# Patient Record
Sex: Female | Born: 1985 | Race: Black or African American | Hispanic: No | Marital: Single | Smoking: Current every day smoker
Health system: Southern US, Community
[De-identification: ages and names within clinical notes are randomized; demographics above are authoritative.]

## PROBLEM LIST (undated history)

## (undated) DIAGNOSIS — K219 Gastro-esophageal reflux disease without esophagitis: Secondary | ICD-10-CM

## (undated) DIAGNOSIS — IMO0001 Reserved for inherently not codable concepts without codable children: Secondary | ICD-10-CM

## (undated) DIAGNOSIS — L0291 Cutaneous abscess, unspecified: Secondary | ICD-10-CM

## (undated) DIAGNOSIS — T4145XA Adverse effect of unspecified anesthetic, initial encounter: Secondary | ICD-10-CM

## (undated) DIAGNOSIS — T8859XA Other complications of anesthesia, initial encounter: Secondary | ICD-10-CM

## (undated) HISTORY — PX: CHOLECYSTECTOMY: SHX55

## (undated) HISTORY — PX: DILATION AND CURETTAGE OF UTERUS: SHX78

## (undated) HISTORY — PX: ABCESS DRAINAGE: SHX399

## (undated) HISTORY — PX: HERNIA REPAIR: SHX51

## (undated) HISTORY — PX: ABDOMINAL SURGERY: SHX537

## (undated) HISTORY — PX: TUBAL LIGATION: SHX77

## (undated) HISTORY — PX: HYDRADENITIS EXCISION: SHX5243

---

## 2005-07-15 ENCOUNTER — Emergency Department (HOSPITAL_COMMUNITY): Admission: EM | Admit: 2005-07-15 | Discharge: 2005-07-15 | Payer: Self-pay | Admitting: Family Medicine

## 2005-07-17 ENCOUNTER — Emergency Department (HOSPITAL_COMMUNITY): Admission: EM | Admit: 2005-07-17 | Discharge: 2005-07-17 | Payer: Self-pay | Admitting: Family Medicine

## 2005-09-20 ENCOUNTER — Emergency Department (HOSPITAL_COMMUNITY): Admission: EM | Admit: 2005-09-20 | Discharge: 2005-09-20 | Payer: Self-pay | Admitting: Emergency Medicine

## 2005-09-20 ENCOUNTER — Ambulatory Visit (HOSPITAL_COMMUNITY): Admission: RE | Admit: 2005-09-20 | Discharge: 2005-09-20 | Payer: Self-pay | Admitting: Emergency Medicine

## 2006-07-03 ENCOUNTER — Emergency Department (HOSPITAL_COMMUNITY): Admission: EM | Admit: 2006-07-03 | Discharge: 2006-07-04 | Payer: Self-pay | Admitting: Emergency Medicine

## 2006-11-26 ENCOUNTER — Emergency Department (HOSPITAL_COMMUNITY): Admission: EM | Admit: 2006-11-26 | Discharge: 2006-11-26 | Payer: Self-pay | Admitting: Emergency Medicine

## 2007-01-31 ENCOUNTER — Ambulatory Visit (HOSPITAL_COMMUNITY): Admission: RE | Admit: 2007-01-31 | Discharge: 2007-01-31 | Payer: Self-pay | Admitting: Obstetrics and Gynecology

## 2010-11-21 ENCOUNTER — Other Ambulatory Visit: Payer: Self-pay | Admitting: Family Medicine

## 2010-11-21 DIAGNOSIS — Z3689 Encounter for other specified antenatal screening: Secondary | ICD-10-CM

## 2010-12-19 ENCOUNTER — Ambulatory Visit (HOSPITAL_COMMUNITY)
Admission: RE | Admit: 2010-12-19 | Discharge: 2010-12-19 | Disposition: A | Discharge status: Home / Self Care | Payer: Medicaid Other | Source: Ambulatory Visit | Attending: Family Medicine | Admitting: Family Medicine

## 2010-12-19 ENCOUNTER — Encounter (HOSPITAL_COMMUNITY): Payer: Self-pay

## 2010-12-19 DIAGNOSIS — Z3689 Encounter for other specified antenatal screening: Secondary | ICD-10-CM

## 2010-12-19 DIAGNOSIS — Z363 Encounter for antenatal screening for malformations: Secondary | ICD-10-CM | POA: Insufficient documentation

## 2010-12-19 DIAGNOSIS — O358XX0 Maternal care for other (suspected) fetal abnormality and damage, not applicable or unspecified: Secondary | ICD-10-CM | POA: Insufficient documentation

## 2010-12-19 DIAGNOSIS — Z1389 Encounter for screening for other disorder: Secondary | ICD-10-CM | POA: Insufficient documentation

## 2011-01-02 ENCOUNTER — Other Ambulatory Visit (HOSPITAL_COMMUNITY): Payer: Self-pay | Admitting: Obstetrics and Gynecology

## 2011-01-02 DIAGNOSIS — Z09 Encounter for follow-up examination after completed treatment for conditions other than malignant neoplasm: Secondary | ICD-10-CM

## 2011-01-09 ENCOUNTER — Ambulatory Visit (HOSPITAL_COMMUNITY): Payer: Medicaid Other

## 2011-01-18 ENCOUNTER — Ambulatory Visit (HOSPITAL_COMMUNITY): Payer: Medicaid Other

## 2011-02-01 ENCOUNTER — Ambulatory Visit (HOSPITAL_COMMUNITY)
Admission: RE | Admit: 2011-02-01 | Discharge: 2011-02-01 | Disposition: A | Discharge status: Home / Self Care | Payer: Medicaid Other | Source: Ambulatory Visit | Attending: Obstetrics and Gynecology | Admitting: Obstetrics and Gynecology

## 2011-02-01 DIAGNOSIS — Z1389 Encounter for screening for other disorder: Secondary | ICD-10-CM | POA: Insufficient documentation

## 2011-02-01 DIAGNOSIS — Z09 Encounter for follow-up examination after completed treatment for conditions other than malignant neoplasm: Secondary | ICD-10-CM

## 2011-02-01 DIAGNOSIS — O358XX0 Maternal care for other (suspected) fetal abnormality and damage, not applicable or unspecified: Secondary | ICD-10-CM | POA: Insufficient documentation

## 2011-02-01 DIAGNOSIS — Z363 Encounter for antenatal screening for malformations: Secondary | ICD-10-CM | POA: Insufficient documentation

## 2011-02-26 ENCOUNTER — Other Ambulatory Visit (HOSPITAL_COMMUNITY): Payer: Self-pay | Admitting: *Deleted

## 2011-05-14 ENCOUNTER — Inpatient Hospital Stay (HOSPITAL_COMMUNITY): Admission: AD | Admit: 2011-05-14 | Payer: Self-pay | Source: Ambulatory Visit | Admitting: Obstetrics & Gynecology

## 2013-10-15 HISTORY — PX: COLPOSCOPY: SHX161

## 2014-08-16 ENCOUNTER — Encounter (HOSPITAL_COMMUNITY): Payer: Self-pay

## 2014-09-28 DIAGNOSIS — K8010 Calculus of gallbladder with chronic cholecystitis without obstruction: Secondary | ICD-10-CM | POA: Insufficient documentation

## 2014-10-15 HISTORY — PX: TUBAL LIGATION: SHX77

## 2014-10-15 HISTORY — PX: GALLBLADDER SURGERY: SHX652

## 2014-10-15 HISTORY — PX: INGUINAL HERNIA REPAIR: SUR1180

## 2014-11-25 DIAGNOSIS — Z9049 Acquired absence of other specified parts of digestive tract: Secondary | ICD-10-CM | POA: Insufficient documentation

## 2015-02-08 ENCOUNTER — Emergency Department (HOSPITAL_COMMUNITY): Payer: Medicaid Other

## 2015-02-08 ENCOUNTER — Emergency Department (HOSPITAL_COMMUNITY)
Admission: EM | Admit: 2015-02-08 | Discharge: 2015-02-08 | Disposition: A | Discharge status: Home / Self Care | Payer: Medicaid Other | Attending: Emergency Medicine | Admitting: Emergency Medicine

## 2015-02-08 ENCOUNTER — Encounter (HOSPITAL_COMMUNITY): Payer: Self-pay | Admitting: *Deleted

## 2015-02-08 DIAGNOSIS — R0781 Pleurodynia: Secondary | ICD-10-CM | POA: Diagnosis not present

## 2015-02-08 DIAGNOSIS — N739 Female pelvic inflammatory disease, unspecified: Secondary | ICD-10-CM

## 2015-02-08 DIAGNOSIS — K610 Anal abscess: Secondary | ICD-10-CM | POA: Insufficient documentation

## 2015-02-08 DIAGNOSIS — R109 Unspecified abdominal pain: Secondary | ICD-10-CM | POA: Diagnosis present

## 2015-02-08 DIAGNOSIS — L0231 Cutaneous abscess of buttock: Secondary | ICD-10-CM | POA: Insufficient documentation

## 2015-02-08 DIAGNOSIS — R52 Pain, unspecified: Secondary | ICD-10-CM

## 2015-02-08 DIAGNOSIS — Z720 Tobacco use: Secondary | ICD-10-CM | POA: Diagnosis not present

## 2015-02-08 DIAGNOSIS — N764 Abscess of vulva: Secondary | ICD-10-CM | POA: Insufficient documentation

## 2015-02-08 DIAGNOSIS — Z3202 Encounter for pregnancy test, result negative: Secondary | ICD-10-CM | POA: Insufficient documentation

## 2015-02-08 LAB — COMPREHENSIVE METABOLIC PANEL
ALK PHOS: 79 U/L (ref 39–117)
ALT: 21 U/L (ref 0–35)
AST: 20 U/L (ref 0–37)
Albumin: 3.5 g/dL (ref 3.5–5.2)
Anion gap: 7 (ref 5–15)
BUN: 8 mg/dL (ref 6–23)
CALCIUM: 8.7 mg/dL (ref 8.4–10.5)
CO2: 24 mmol/L (ref 19–32)
CREATININE: 0.74 mg/dL (ref 0.50–1.10)
Chloride: 106 mmol/L (ref 96–112)
GFR calc Af Amer: >90 mL/min (ref >90)
GFR calc non Af Amer: >90 mL/min (ref >90)
Glucose, Bld: 90 mg/dL (ref 70–99)
Potassium: 3.7 mmol/L (ref 3.5–5.1)
Sodium: 137 mmol/L (ref 135–145)
Total Bilirubin: 0.3 mg/dL (ref 0.3–1.2)
Total Protein: 6.8 g/dL (ref 6.0–8.3)

## 2015-02-08 LAB — CBC WITH DIFFERENTIAL/PLATELET
BASOS ABS: 0 10*3/uL (ref 0.0–0.1)
Basophils Relative: 0 % (ref 0–1)
Eosinophils Absolute: 0.3 10*3/uL (ref 0.0–0.7)
Eosinophils Relative: 4 % (ref 0–5)
HEMATOCRIT: 35.6 % — AB (ref 36.0–46.0)
Hemoglobin: 11.1 g/dL — ABNORMAL LOW (ref 12.0–15.0)
Lymphocytes Relative: 22 % (ref 12–46)
Lymphs Abs: 1.8 10*3/uL (ref 0.7–4.0)
MCH: 24.9 pg — ABNORMAL LOW (ref 26.0–34.0)
MCHC: 31.2 g/dL (ref 30.0–36.0)
MCV: 79.8 fL (ref 78.0–100.0)
Monocytes Absolute: 0.5 10*3/uL (ref 0.1–1.0)
Monocytes Relative: 6 % (ref 3–12)
Neutro Abs: 5.5 10*3/uL (ref 1.7–7.7)
Neutrophils Relative %: 68 % (ref 43–77)
Platelets: 230 10*3/uL (ref 150–400)
RBC: 4.46 MIL/uL (ref 3.87–5.11)
RDW: 16.5 % — AB (ref 11.5–15.5)
WBC: 8 10*3/uL (ref 4.0–10.5)

## 2015-02-08 LAB — URINALYSIS, ROUTINE W REFLEX MICROSCOPIC
BILIRUBIN URINE: NEGATIVE
Glucose, UA: NEGATIVE mg/dL
HGB URINE DIPSTICK: NEGATIVE
Ketones, ur: NEGATIVE mg/dL
Leukocytes, UA: NEGATIVE
NITRITE: NEGATIVE
PROTEIN: NEGATIVE mg/dL
SPECIFIC GRAVITY, URINE: 1.011 (ref 1.005–1.030)
Urobilinogen, UA: 0.2 mg/dL (ref 0.0–1.0)
pH: 6.5 (ref 5.0–8.0)

## 2015-02-08 LAB — POC URINE PREG, ED: Preg Test, Ur: NEGATIVE

## 2015-02-08 LAB — LIPASE, BLOOD: LIPASE: 22 U/L (ref 11–59)

## 2015-02-08 MED ORDER — CLINDAMYCIN HCL 300 MG PO CAPS: 300.0000 mg | ORAL_CAPSULE | Freq: Four times a day (QID) | ORAL | Status: DC

## 2015-02-08 MED ORDER — OXYCODONE-ACETAMINOPHEN 5-325 MG PO TABS
1.0000 | ORAL_TABLET | Freq: Once | ORAL | Status: AC
  Administered 2015-02-08: 1 via ORAL
  Filled 2015-02-08: qty 1

## 2015-02-08 MED ORDER — LIDOCAINE HCL (PF) 2 % IJ SOLN
0.0000 mL | Freq: Once | INTRAMUSCULAR | Status: DC | PRN
  Filled 2015-02-08: qty 20

## 2015-02-08 NOTE — ED Provider Notes (Signed)
CSN: [id number]     Arrival date & time 02/08/15  4098 History   First MD Initiated Contact with Patient 02/08/15 1019     Chief Complaint  Patient presents with  . Abdominal Pain     (Consider location/radiation/quality/duration/timing/severity/associated sxs/prior Treatment) HPI  Samantha Orr is a 29 y.o. female presenting with abscess to left pelvic area and right buttocks that have been ongoing for the last week. Patient denies any treatment. Pain worse with palpation. No discharge. No fevers or chills no nausea vomiting. Patient also reporting persistent right-sided pain after laparoscopic cholecystectomy in February of this year. She presented multiple times May diagnosed her with postop pain. She denies any nausea, vomiting, diarrhea. No abdominal pain no pelvic or urinary symptoms. Patient has not been taking anything for her discomfort.   History reviewed. No pertinent past medical history. Past Surgical History  Procedure Laterality Date  . Abdominal surgery    . Cholecystectomy    . Tubal ligation     No family history on file. History  Substance Use Topics  . Smoking status: Current Every Day Smoker -- 0.50 packs/day  . Smokeless tobacco: Not on file  . Alcohol Use: Yes   OB History    Gravida Para Term Preterm AB TAB SAB Ectopic Multiple Living   1              Review of Systems 10 Systems reviewed and are negative for acute change except as noted in the HPI.    Allergies  Review of patient's allergies indicates no known allergies.  Home Medications   Prior to Admission medications   Medication Sig Start Date End Date Taking? Authorizing Provider  clindamycin (CLEOCIN) 300 MG capsule Take 1 capsule (300 mg total) by mouth 4 (four) times daily. X 7 days 02/08/15   Oswaldo Conroy, PA-C   BP 100/81 mmHg  Pulse 73  Temp(Src) 98.4 F (36.9 C) (Oral)  Resp 18  Ht  (1.575 m)  Wt 203 lb (92.08 kg)  BMI 37.12 kg/m2  SpO2 100%  LMP 01/06/2015  (Approximate) Physical Exam  Constitutional: She appears well-developed and well-nourished. No distress.  HENT:  Head: Normocephalic and atraumatic.  Mouth/Throat: Oropharynx is clear and moist.  Eyes: Conjunctivae and EOM are normal. Right eye exhibits no discharge. Left eye exhibits no discharge.  Cardiovascular: Normal rate and regular rhythm.   Pulmonary/Chest: Effort normal and breath sounds normal. No respiratory distress. She has no wheezes.  Right-sided low lateral rib tenderness without overlying skin changes. No subcutaneous emphysema.  Abdominal: Soft. Bowel sounds are normal. She exhibits no distension. There is no tenderness.  Neurological: She is alert. She exhibits normal muscle tone. Coordination normal.  Skin: Skin is warm and dry. She is not diaphoretic.  2-3 cm area of fluctuance with surrounding induration to right buttocks 4-5 cm from midline. No spontaneous drainage. 1-2 cm area of fluctuance to left upper pubic area without spontaneous drainage.  Nursing note and vitals reviewed.   ED Course  Procedures (including critical care time) Labs Review Labs Reviewed  CBC WITH DIFFERENTIAL/PLATELET - Abnormal; Notable for the following:    Hemoglobin 11.1 (*)    HCT 35.6 (*)    MCH 24.9 (*)    RDW 16.5 (*)    All other components within normal limits  COMPREHENSIVE METABOLIC PANEL  LIPASE, BLOOD  URINALYSIS, ROUTINE W REFLEX MICROSCOPIC  POC URINE PREG, ED    Imaging Review Dg Abd Acute W/chest  02/08/2015  CLINICAL DATA:  29 year old female with right-sided abdominal pain for 7 weeks post gallbladder surgery 11/17/2014. Initial encounter.  EXAM: DG ABDOMEN ACUTE W/ 1V CHEST  COMPARISON:  None.  FINDINGS: No infiltrate, congestive heart failure or pneumothorax.  Mild central pulmonary vascular prominence.  Heart size within normal limits.  No plain film evidence of bowel obstruction or free intraperitoneal air. Surgical clips right upper quadrant consistent with  prior cholecystectomy.  IMPRESSION: No plain film evidence of bowel obstruction or free intraperitoneal air. If postoperative complication were of high clinical concern CT imaging may be considered.   Electronically Signed   By: Lacy DuverneySteven  Olson M.D.   On: 02/08/2015 12:59     EKG Interpretation None      INCISION AND DRAINAGE Performed by: Oswaldo ConroyREECH, Anber Mckiver Consent: Verbal consent obtained. Risks and benefits: risks, benefits and alternatives were discussed Type: abscess  Body area: Right buttock and left upper pelvic area.  Anesthesia: local infiltration  Incision was made with a scalpel.  Local anesthetic: lidocaine 2% w/o epinephrine  Anesthetic total: 6 ml  Complexity: complex Blunt dissection to break up loculations  Drainage: purulent  Drainage amount: Copious   Packing material: 1/4 in iodoform gauze in both areas   Patient tolerance: Patient tolerated the procedure well with no immediate complications.     MDM   Final diagnoses:  Perianal abscess  Abscess of female genitalia  Pain of right side of body   Patient presenting with 2 complaints, right-sided persistent pain after surgery without change and abscess. No other abdominal symptoms. VSS. No abdominal tenderness on exam. Nonsurgical abdomen. Minimal tenderness to right lateral abdomen without skin changes. On repeat exam patient without any tenderness. Lab workup reassuring. She is to follow-up with her surgeon. IND performed with abscess without immediate complications. Packing in place. Patient given clindamycin and is to follow-up with the wellness center in 2 days for wound reevaluation. Discussed warm soaks.  Discussed return precautions with patient. Discussed all results and patient verbalizes understanding and agrees with plan.    Oswaldo ConroyVictoria Amany Rando, PA-C 02/08/15 1442  Samuel JesterKathleen McManus, DO 02/11/15 2111

## 2015-02-08 NOTE — Discharge Instructions (Signed)
Return to the emergency room with worsening of symptoms, new symptoms or with symptoms that are concerning , especially fevers, abdominal pain in one area, unable to keep down fluids, blood in stool or vomit, severe pain, you feel faint, lightheaded or pass out OR fevers, redness, swelling, red streaks from the area. Warm soaks to the area 3 times daily. Please take all of your antibiotics until finished!   You may develop abdominal discomfort or diarrhea from the antibiotic.  You may help offset this with probiotics which you can buy or get in yogurt. Do not eat  or take the probiotics until 2 hours after your antibiotic.  Go to the wellness center in 2 days for wound recheck. Read below information and follow recommendations.  Abscess Care After An abscess (also called a boil or furuncle) is an infected area that contains a collection of pus. Signs and symptoms of an abscess include pain, tenderness, redness, or hardness, or you may feel a moveable soft area under your skin. An abscess can occur anywhere in the body. The infection may spread to surrounding tissues causing cellulitis. A cut (incision) by the surgeon was made over your abscess and the pus was drained out. Gauze may have been packed into the space to provide a drain that will allow the cavity to heal from the inside outwards. The boil may be painful for 5 to 7 days. Most people with a boil do not have high fevers. Your abscess, if seen early, may not have localized, and may not have been lanced. If not, another appointment may be required for this if it does not get better on its own or with medications. HOME CARE INSTRUCTIONS   Only take over-the-counter or prescription medicines for pain, discomfort, or fever as directed by your caregiver.  When you bathe, soak and then remove gauze or iodoform packs at least daily or as directed by your caregiver. You may then wash the wound gently with mild soapy water. Repack with gauze or do as  your caregiver directs. SEEK IMMEDIATE MEDICAL CARE IF:   You develop increased pain, swelling, redness, drainage, or bleeding in the wound site.  You develop signs of generalized infection including muscle aches, chills, fever, or a general ill feeling.  An oral temperature above 102 F (38.9 C) develops, not controlled by medication. See your caregiver for a recheck if you develop any of the symptoms described above. If medications (antibiotics) were prescribed, take them as directed. Document Released: 04/19/2005 Document Revised: 12/24/2011 Document Reviewed: 12/15/2007 [facility] Patient Information 2015 [place], [place]. This information is not intended to replace advice given to you by your health care provider. Make sure you discuss any questions you have with your health care provider.  Abdominal Pain Many things can cause abdominal pain. Usually, abdominal pain is not caused by a disease and will improve without treatment. It can often be observed and treated at home. Your health care provider will do a physical exam and possibly order blood tests and X-rays to help determine the seriousness of your pain. However, in many cases, more time must pass before a clear cause of the pain can be found. Before that point, your health care provider may not know if you need more testing or further treatment. HOME CARE INSTRUCTIONS  Monitor your abdominal pain for any changes. The following actions may help to alleviate any discomfort you are experiencing:  Only take over-the-counter or prescription medicines as directed by your health care provider.  Do  not take laxatives unless directed to do so by your health care provider.  Try a clear liquid diet (broth, tea, or water) as directed by your health care provider. Slowly move to a bland diet as tolerated. SEEK MEDICAL CARE IF:  You have unexplained abdominal pain.  You have abdominal pain associated with nausea or diarrhea.  You have pain  when you urinate or have a bowel movement.  You experience abdominal pain that wakes you in the night.  You have abdominal pain that is worsened or improved by eating food.  You have abdominal pain that is worsened with eating fatty foods.  You have a fever. SEEK IMMEDIATE MEDICAL CARE IF:   Your pain does not go away within 2 hours.  You keep throwing up (vomiting).  Your pain is felt only in portions of the abdomen, such as the right side or the left lower portion of the abdomen.  You pass bloody or black tarry stools. MAKE SURE YOU:  Understand these instructions.   Will watch your condition.   Will get help right away if you are not doing well or get worse.  Document Released: 07/11/2005 Document Revised: 10/06/2013 Document Reviewed: 06/10/2013 [facility] Patient Information 2015 [place], [place]. This information is not intended to replace advice given to you by your health care provider. Make sure you discuss any questions you have with your health care provider.

## 2015-02-08 NOTE — ED Notes (Signed)
Pt reports rt side pain that had been intermittent since abdominal surgeries in february. Pt also reports 2 abscesses one on rt buttocks and other on left side.

## 2015-02-11 ENCOUNTER — Inpatient Hospital Stay: Payer: Medicaid Other | Admitting: Family Medicine

## 2015-02-20 ENCOUNTER — Emergency Department (HOSPITAL_COMMUNITY)
Admission: EM | Admit: 2015-02-20 | Discharge: 2015-02-20 | Disposition: A | Discharge status: Home / Self Care | Payer: Medicaid Other | Attending: Emergency Medicine | Admitting: Emergency Medicine

## 2015-02-20 ENCOUNTER — Encounter (HOSPITAL_COMMUNITY): Payer: Self-pay

## 2015-02-20 DIAGNOSIS — Z720 Tobacco use: Secondary | ICD-10-CM | POA: Diagnosis not present

## 2015-02-20 DIAGNOSIS — L02214 Cutaneous abscess of groin: Secondary | ICD-10-CM | POA: Diagnosis not present

## 2015-02-20 HISTORY — DX: Cutaneous abscess, unspecified: L02.91

## 2015-02-20 MED ORDER — HYDROCODONE-ACETAMINOPHEN 5-325 MG PO TABS
1.0000 | ORAL_TABLET | Freq: Once | ORAL | Status: AC
  Administered 2015-02-20: 1 via ORAL
  Filled 2015-02-20: qty 1

## 2015-02-20 MED ORDER — CLINDAMYCIN HCL 150 MG PO CAPS: 300.0000 mg | ORAL_CAPSULE | Freq: Four times a day (QID) | ORAL | Status: DC

## 2015-02-20 MED ORDER — HYDROCODONE-ACETAMINOPHEN 5-325 MG PO TABS: ORAL_TABLET | ORAL | Status: DC

## 2015-02-20 MED ORDER — NAPROXEN 500 MG PO TABS: 500.0000 mg | ORAL_TABLET | Freq: Two times a day (BID) | ORAL | Status: DC

## 2015-02-20 MED ORDER — LIDOCAINE-EPINEPHRINE 2 %-1:100000 IJ SOLN
10.0000 mL | Freq: Once | INTRAMUSCULAR | Status: AC
  Administered 2015-02-20: 1 mL
  Filled 2015-02-20: qty 1

## 2015-02-20 NOTE — ED Notes (Signed)
Pt here 1 week ago for presenting complaint[facility] One abscess got better the other did not improve. Denies N/V/D and fever

## 2015-02-20 NOTE — ED Notes (Signed)
ED PA at bedside

## 2015-02-20 NOTE — ED Provider Notes (Signed)
CSN: [id number]     Arrival date & time 02/20/15  1131 History   First MD Initiated Contact with Patient 02/20/15 1136     CC: abscess   (Consider location/radiation/quality/duration/timing/severity/associated sxs/prior Treatment) HPI Comments: Patient with history of recurrent skin abscesses presents with continued left groin abscess. Patient was seen in emergency department 12 days ago and had this abscess drained, as well as a buttocks abscess. Patient was placed on clindamycin. Patient states that the buttocks abscess improved and is resolving. Her left groin abscess continues. Patient has not been compliant with her oral antibiotics because "I do not like taking pills". Patient denies fever, nausea, vomiting. No urinary symptoms. No pain with abdominal. No history of immunocompromise. Patient states that she has multiple abscesses after having a C-section recently. Patient was using warm compresses. No other treatments prior to arrival.  The history is provided by the patient and medical records.    Past Medical History  Diagnosis Date  . Abscess    Past Surgical History  Procedure Laterality Date  . Abdominal surgery    . Cholecystectomy    . Tubal ligation    . Hernia repair    . Tubal ligation    . Cesarean section    . Abcess drainage      BUTTOCK   No family history on file. History  Substance Use Topics  . Smoking status: Current Every Day Smoker -- 0.50 packs/day  . Smokeless tobacco: Not on file  . Alcohol Use: Yes   OB History    Gravida Para Term Preterm AB TAB SAB Ectopic Multiple Living   1              Review of Systems  Constitutional: Negative for fever.  Gastrointestinal: Negative for nausea and vomiting.  Skin: Negative for color change.       Positive for abscess.  Hematological: Negative for adenopathy.      Allergies  Bactrim  Home Medications   Prior to Admission medications   Medication Sig Start Date End Date Taking? Authorizing  Provider  clindamycin (CLEOCIN) 300 MG capsule Take 1 capsule (300 mg total) by mouth 4 (four) times daily. X 7 days Patient not taking: Reported on 02/20/2015 02/08/15   Oswaldo ConroyVictoria Creech, PA-C   BP 128/70 mmHg  Pulse 116  Temp(Src) 99.3 F (37.4 C) (Oral)  Resp 17  Ht 5\' 2"  (1.575 m)  Wt 203 lb (92.08 kg)  BMI 37.12 kg/m2  SpO2 98%  LMP 01/06/2015 (Approximate)  Breastfeeding? Unknown   Physical Exam  Constitutional: She appears well-developed and well-nourished.  HENT:  Head: Normocephalic and atraumatic.  Eyes: Conjunctivae are normal.  Neck: Normal range of motion. Neck supple.  Pulmonary/Chest: No respiratory distress.  Neurological: She is alert.  Skin: Skin is warm and dry.  3 cm indurated abscess noted left inguinal crease, no drainage. There is several centimeters of surrounding cellulitis. This area is generally scarred from previous incisions and infections.  Psychiatric: She has a normal mood and affect.  Nursing note and vitals reviewed.   ED Course  Procedures (including critical care time) Labs Review Labs Reviewed - No data to display  Imaging Review No results found.   EKG Interpretation None       12:25 PM Patient seen and examined. Medications ordered.  Patient agreeable to I&D.   Vital signs reviewed and are as follows: BP 128/70 mmHg  Pulse 116  Temp(Src) 99.3 F (37.4 C) (Oral)  Resp 17  Ht  5\' 2"  (1.575 m)  Wt 203 lb (92.08 kg)  BMI 37.12 kg/m2  SpO2 98%  LMP 01/06/2015 (Approximate)  Breastfeeding? Unknown  INCISION AND DRAINAGE Performed by: Carolee RotaGEIPLE,Synai Prettyman S Consent: Verbal consent obtained. Risks and benefits: risks, benefits and alternatives were discussed Type: abscess  Body area: L groin  Anesthesia: local infiltration  Incision was made with a scalpel.  Local anesthetic: lidocaine 2% with epinephrine  Anesthetic total: 2 ml  Complexity: complex Blunt dissection to break up loculations  Drainage: purulent  Drainage  amount: moderate  Packing material: none  Patient tolerance: Patient tolerated the procedure well with no immediate complications.   2:12 PM The patient was urged to return to the Emergency Department urgently with worsening pain, swelling, expanding erythema especially if it streaks away from the affected area, fever, or if they have any other concerns.   The patient was urged to return to the Emergency Department or go to their PCP in 48 hours for wound recheck if the area is not significantly improved.  Patient counseled on use of narcotic pain medications. Counseled not to combine these medications with others containing tylenol. Urged not to drink alcohol, drive, or perform any other activities that requires focus while taking these medications. The patient verbalizes understanding and agrees with the plan.  The patient verbalized understanding and stated agreement with this plan.    MDM   Final diagnoses:  Abscess, groin   Patient with skin abscess amenable to incision and drainage. Mild cellulitis is surrounding skin. Given location and cellulitis, will restart clinda. Patient encouraged to take full course. Will d/c to home.   Renne CriglerJoshua Enrrique Mierzwa, PA-C 02/20/15 1413  Gerhard Munchobert Lockwood, MD 02/20/15 [phone]

## 2015-02-20 NOTE — Discharge Instructions (Signed)
Please read and follow all provided instructions.  Your diagnoses today include:  1. Abscess, groin     Tests performed today include:  Vital signs. See below for your results today.   Medications prescribed:   Clindamycin - antibiotic  You have been prescribed an antibiotic medicine: take the entire course of medicine even if you are feeling better. Stopping early can cause the antibiotic not to work.   Vicodin (hydrocodone/acetaminophen) - narcotic pain medication  DO NOT drive or perform any activities that require you to be awake and alert because this medicine can make you drowsy. BE VERY CAREFUL not to take multiple medicines containing Tylenol (also called acetaminophen). Doing so can lead to an overdose which can damage your liver and cause liver failure and possibly death.   Naproxen - anti-inflammatory pain medication  Do not exceed 500mg  naproxen every 12 hours, take with food  You have been prescribed an anti-inflammatory medication or NSAID. Take with food. Take smallest effective dose for the shortest duration needed for your pain. Stop taking if you experience stomach pain or vomiting.   Take any prescribed medications only as directed.   Home care instructions:   Follow any educational materials contained in this packet  Follow-up instructions: Return to the Emergency Department in 48 hours for a recheck if your symptoms are not significantly improved.  Please follow-up with your primary care provider in the next 1 week for further evaluation of your symptoms.   Return instructions:  Return to the Emergency Department if you have:  Fever  Worsening symptoms  Worsening pain  Worsening swelling  Redness of the skin that moves away from the affected area, especially if it streaks away from the affected area   Any other emergent concerns  Your vital signs today were: BP 128/70 mmHg   Pulse 116   Temp(Src) 99.3 F (37.4 C) (Oral)   Resp 17   Ht 5\' 2"   (1.575 m)   Wt 203 lb (92.08 kg)   BMI 37.12 kg/m2   SpO2 98%   LMP 01/06/2015 (Approximate)   Breastfeeding? Unknown If your blood pressure (BP) was elevated above 135/85 this visit, please have this repeated by your doctor within one month. --------------   Emergency Department Resource Guide 1) Find a Doctor and Pay Out of Pocket Although you won't have to find out who is covered by your insurance plan, it is a good idea to ask around and get recommendations. You will then need to call the office and see if the doctor you have chosen will accept you as a new patient and what types of options they offer for patients who are self-pay. Some doctors offer discounts or will set up payment plans for their patients who do not have insurance, but you will need to ask so you aren't surprised when you get to your appointment.  2) Contact Your Local Health Department Not all health departments have doctors that can see patients for sick visits, but many do, so it is worth a call to see if yours does. If you don't know where your local health department is, you can check in your phone book. The CDC also has a tool to help you locate your state's health department, and many state websites also have listings of all of their local health departments.  3) Find a Walk-in Clinic If your illness is not likely to be very severe or complicated, you may want to try a walk in clinic. These are popping  up all over the country in pharmacies, drugstores, and shopping centers. They're usually staffed by nurse practitioners or physician assistants that have been trained to treat common illnesses and complaints. They're usually fairly quick and inexpensive. However, if you have serious medical issues or chronic medical problems, these are probably not your best option.  No Primary Care Doctor: - Call Health Connect at  [phone] - they can help you locate a primary care doctor that  accepts your insurance, provides certain  services, etc. - Physician Referral Service- [phone]  Chronic Pain Problems: Organization         Address  Phone   Notes  [facility]  [phone] Patients need to be referred by their primary care doctor.   Medication Assistance: Organization         Address  Phone   Notes  [facility] [address] [place] [place], [place] [address] [phone] --Must be a resident of [facility] -- Must have NO insurance coverage whatsoever (no Medicaid/ Medicare, etc.) -- The pt. MUST have a primary care doctor that directs their care regularly and follows them in the community   [facility]ist  [phone]   [employer]  [phone]    Agencies that provide inexpensive medical care: Organization         Address  Phone   Notes  [facility]  [phone]   [facility]    [phone]   [facility] [address] [place], [place] [address] [phone]   [facility] [address] [place], [place] [phone]   Planned Parenthood    [phone]   [facility]    [phone]   [facility]  [address], [place] Phone:  [phone], Fax:  [phone] Hours of Operation:  9 am - 6 pm, M-F.  Also accepts Medicaid/Medicare and self-pay.  [facility]  [address], [place] Phone: [phone], Fax: [phone]. Hours of Operation:  8:30 am - 5:30 pm, M-F.  Also accepts Medicaid and self-pay.  [facility] [place] [address] [place] Phone: [phone]   [facility] [address] Natasha Bence [place], [place] [phone], Ext. [phone] Mondays & Thursdays: 7-9 AM.  First 15 patients are seen on a first come, first serve basis.    Medicaid-accepting [facility] Providers:  Organization         Address  Phone   Notes  [facility] [address], [place] [phone] Also accepts self-pay patients.  [facility] [address] [place], [place]  [phone]   [facility] [address], [place] [phone]   [facility] [address], [place] [phone]   Renaye Rakers [address], [place]   [phone] Only accepts [place] patients after they have their name applied to their card.   Self-Pay (no insurance) in [facility]:  Organization         Address  Phone   Notes  Sickle Cell Patients, [facility] [address] [place], [place] [phone]   [facility] [address] [place], [place] [phone]   [facility] [place]  [phone],  [phone]   [facility]/Dr. Osei-Bonsu  [address] [place] [address], [place] or [address], [place] [phone] Phone number for both [place] and [place] locations is the same.  [facility] [address], [place] [phone]   [facility] [place] [address] [place] [address], [place] or [address] [phone]   [facility] [address], [place] [phone], phone; [phone], fax Sees patients 1st and 3rd Saturday of every month.  Must not qualify for public or private insurance (i.e. Medicaid, Medicare, [place] Health Choice, Veterans' Benefits)  Household income should be no more than 200% of the poverty level The clinic cannot treat you if you are pregnant or think you are pregnant  Sexually transmitted diseases are not treated at the clinic.    Dental Care: Organization         Address  Phone  Notes  [facility] [address], [place] [phone] Accepts  children up to age 31 who are enrolled in [place]icaid or [place] Health Choice; pregnant women with a Medicaid card; and children who have applied for Medicaid or [place] Health Choice, but were declined, whose parents can pay a reduced fee at time of service.  [facility] [place]  [address], [place] [phone] Accepts children up to age 31 who are enrolled in [place] or [place] Health Choice; pregnant women with a Medicaid card; and children who have applied for Medicaid or [place] Health Choice, but were declined, whose parents can pay a reduced fee at time of service.  [facility]  [address], [place] [phone] Patients are seen by appointment only. Walk-ins are not accepted. [facility] will see patients 31 years of age and older. Monday - Tuesday (8am-5pm) Most Wednesdays (8:30-5pm) $30 per visit, cash only  [facility]  [address], [facility] [place] [phone] Patients are seen by appointment only. Walk-ins are not accepted. [facility] will see patients 31 years of age and older. One Wednesday Evening (Monthly: Volunteer Based).  $30 per visit, cash only  [employer] [facility]  [phone] for adults; Children under age 844, call Graduate Pediatric Dentistry at [phone]. Children aged 324-14, please call [phone] to request a pediatric application.  Dental services are provided in all areas of dental care including fillings, crowns and bridges, complete and partial dentures, implants, gum treatment, root canals, and extractions. Preventive care is also provided. Treatment is provided to both adults and children. Patients are selected via a lottery and there is often a waiting list.   [facility] [address], [place]  [phone] [email or web address]   [facility] [address], [place], [place] [phone], Ext. [phone] Second and  Fourth Thursday of each month, opens at 6:30 AM; Clinic ends at 9 AM.  Patients are seen on a first-come first-served basis, and a limited number are seen during each clinic.   [facility]  [address], [place], [place] [phone]   Eligibility Requirements You must have lived in [place], [place], or [place] counties for at least the last three months.   You cannot be eligible for state or federal sponsored National Cityhealthcare insurance, including CIGNAVeterans Administration, [place]icaid, or  Medicare.   You generally cannot be eligible for healthcare insurance through your employer.    How to apply: Eligibility screenings are held every Tuesday and Wednesday afternoon from 1:00 pm until 4:00 pm. You do not need an appointment for the interview!  [facility] [address], [place], [place] [phone]   [facility]  [phone]   [facility]  [phone]   [facility]  [phone]    Behavioral Health Resources in the Community: Intensive Outpatient Programs Organization         Address  Phone  Notes  [facility] [address], [place], [place] [phone]   [facility] [address], [place], [place] [phone]   [facility] [address], [place], [place]  [phone]   [facility] [address],  [place], [place] [phone] or [phone]   Substance Abuse Resources Organization         Address  Phone  Notes  [facility]  [phone]   [facility]  [phone]   [facility]  [phone]   [facility]  [phone]   Residential & Outpatient Substance Abuse Program  [phone]   Psychological Services Organization         Address  Phone  Notes  [facility]  [phone]   [facility]  [phone]- [phone]   [facility]  [address], [place] [phone] or [phone]    Mobile Crisis Teams Organization         Address  Phone  Notes  [facility], Mobile Crisis Care Unit  [phone]   [facility]  [address] [place], [place] [phone]   Doristine LocksSharon DeEsch [address] [place] [phone]    Self-Help/Support Groups Organization         Address  Phone             Notes  [facility] [place] - variety of support groups  [phone] Call for more information  Narcotics Anonymous (NA), [facility] [address], [place]  2 meetings at this location   Statisticianesidential Treatment Programs Organization         Address  Phone  Notes  [facility] [address],    [place]  [phone]   [facility]  [address], [place] [phone], [place], [place] [phone]   [facility] [address], [place] [phone] Admissions: 8am-3pm M-F  [facility] [address]    [place], [place] [phone]   [facility] [address], [place], [place] [phone]   [facility] [address]  [place], [place] [phone]   [facility] - Intensive Outpatient [address], [place], [place] [phone]   [facility] [address]  [place], [place] [phone] or [phone]   [facility] ([facility]) [address] [place], [place] [phone] Accepts Medicaid  [facility] [address]  [place] [phone] Substance Abuse/Addiction Treatment   [facility] Resources Organization         Address  Phone  Notes  [facility]  [phone]) [phone]   Angie FavaJulie Brannon, PhD [address] [place], [place]   [phone] or [phone]([phone]   [facility]  [address] [place], [place] [phone]   [facility] [address], [place], [place] [phone] Insurance/Medicaid/sponsorship through [facility] and Families [address]                                    [place], [place] [phone] Therapy/tele-psych/case  [facility] [address]   [place], [place] [phone]    Dr. Lolly Mustache  [phone]   [facility]  [employer] [facility] Dept. 1) [address] [place] 2) [address] [place] 3)  [address], [place] [phone]  [phone]   [facility] Child Abuse Hotline [phone] or [phone] (After Hours)

## 2015-11-12 ENCOUNTER — Encounter: Payer: Self-pay | Admitting: Emergency Medicine

## 2015-11-12 ENCOUNTER — Emergency Department
Admission: EM | Admit: 2015-11-12 | Discharge: 2015-11-12 | Disposition: A | Discharge status: Home / Self Care | Payer: Medicaid Other | Attending: Emergency Medicine | Admitting: Emergency Medicine

## 2015-11-12 DIAGNOSIS — L03314 Cellulitis of groin: Secondary | ICD-10-CM | POA: Insufficient documentation

## 2015-11-12 DIAGNOSIS — Z791 Long term (current) use of non-steroidal anti-inflammatories (NSAID): Secondary | ICD-10-CM | POA: Diagnosis not present

## 2015-11-12 DIAGNOSIS — F17200 Nicotine dependence, unspecified, uncomplicated: Secondary | ICD-10-CM | POA: Insufficient documentation

## 2015-11-12 DIAGNOSIS — L02214 Cutaneous abscess of groin: Secondary | ICD-10-CM | POA: Diagnosis present

## 2015-11-12 MED ORDER — CLINDAMYCIN HCL 300 MG PO CAPS: 300.0000 mg | ORAL_CAPSULE | Freq: Four times a day (QID) | ORAL | Status: DC

## 2015-11-12 MED ORDER — HYDROCODONE-ACETAMINOPHEN 5-325 MG PO TABS
1.0000 | ORAL_TABLET | ORAL | Status: DC | PRN
Start: 2015-11-12 — End: 2016-05-28

## 2015-11-12 NOTE — ED Provider Notes (Signed)
[facility] Emergency Department Provider Note  ____________________________________________  Time seen: Approximately 7:34 AM  I have reviewed the triage vital signs and the nursing notes.   HISTORY  Chief Complaint Abscess    HPI Samantha Orr is a 30 y.o. female who presents to the emergency department complaining of an abscess to her right inguinal region. 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 She has had multiple incisions and drainages for this. Patient states that she normally uses warm compresses and Epsom salts to bring abscess to a "head." She states that she recently moved and didn't have access to a microwave to heat up compresses. Patient denies any fevers or chills, abdominal pain, nausea or vomiting, diarrhea or constipation.   Past Medical History  Diagnosis Date  . Abscess     There are no active problems to display for this patient.   Past Surgical History  Procedure Laterality Date  . Abdominal surgery    . Cholecystectomy    . Tubal ligation    . Hernia repair    . Tubal ligation    . Cesarean section    . Abcess drainage      BUTTOCK    Current Outpatient Rx  Name  Route  Sig  Dispense  Refill  . clindamycin (CLEOCIN) 300 MG capsule   Oral   Take 1 capsule (300 mg total) by mouth 4 (four) times daily.   28 capsule   0   . HYDROcodone-acetaminophen (NORCO/VICODIN) 5-325 MG tablet   Oral   Take 1 tablet by mouth every 4 (four) hours as needed for moderate pain.   8 tablet   0   . naproxen (NAPROSYN) 500 MG tablet   Oral   Take 1 tablet (500 mg total) by mouth 2 (two) times daily.   20 tablet   0     Allergies Bactrim  History reviewed. No pertinent family history.  Social History Social History  Substance Use Topics  . Smoking status: Current Every Day Smoker -- 0.50 packs/day  . Smokeless tobacco: None  . Alcohol Use: Yes     Review of Systems  Constitutional: No  fever/chills Eyes: No visual changes. No discharge ENT: No sore throat. Cardiovascular: no chest pain. Respiratory: no cough. No SOB. Gastrointestinal: No abdominal pain.  No nausea, no vomiting.  No diarrhea.  No constipation. Genitourinary: Negative for dysuria. No hematuria Musculoskeletal: Negative for back pain. Skin: Negative for rash. Endorsing abscess to right inguinal region Neurological: Negative for headaches, focal weakness or numbness. 10-point ROS otherwise negative.  ____________________________________________   PHYSICAL EXAM:  VITAL SIGNS: ED Triage Vitals  Enc Vitals Group     BP 11/12/15 0717 119/61 mmHg     Pulse Rate 11/12/15 0717 80     Resp 11/12/15 0717 18     Temp 11/12/15 0717 97.7 F (36.5 C)     Temp Source 11/12/15 0717 Oral     SpO2 11/12/15 0717 100 %     Weight 11/12/15 0717 204 lb (92.534 kg)     Height 11/12/15 0717  (1.575 m)     Head Cir --      Peak Flow --      Pain Score 11/12/15 0718 10     Pain Loc --      Pain Edu? --      Excl. in GC? --      Constitutional: Alert and oriented. Well appearing and in no acute  distress. Eyes: Conjunctivae are normal. PERRL. EOMI. Head: Atraumatic. ENT:      Ears:       Nose: No congestion/rhinnorhea.      Mouth/Throat: Mucous membranes are moist.  Neck: No stridor.   Hematological/Lymphatic/Immunilogical: No cervical lymphadenopathy. Cardiovascular: Normal rate, regular rhythm. Normal S1 and S2.  Good peripheral circulation. Respiratory: Normal respiratory effort without tachypnea or retractions. Lungs CTAB. Gastrointestinal: Soft and nontender. No distention. No CVA tenderness. Musculoskeletal: No lower extremity tenderness nor edema.  No joint effusions. Neurologic:  Normal speech and language. No gross focal neurologic deficits are appreciated.  Skin:  Skin is warm, dry and intact. No rash noted. Minor erythema and edema noted to the right inguinal region. No drainage noted. Area is  firm to palpation. There is no fluctuance to palpation. No inguinal lymph nodes are appreciated. Area is very tender to palpation. Psychiatric: Mood and affect are normal. Speech and behavior are normal. Patient exhibits appropriate insight and judgement.  Exam was chaperoned by female tech. ____________________________________________   LABS (all labs ordered are listed, but only abnormal results are displayed)  Labs Reviewed - No data to display ____________________________________________  EKG   ____________________________________________  RADIOLOGY   No results found.  ____________________________________________    PROCEDURES  Procedure(s) performed:       Medications - No data to display   ____________________________________________   INITIAL IMPRESSION / ASSESSMENT AND PLAN / ED COURSE  Pertinent labs & imaging results that were available during my care of the patient were reviewed by me and considered in my medical decision making (see chart for details).  Patient's diagnosis is consistent with cellulitis to the right inguinal region. Patient has a reoccurring history of abscesses to this region. At this time there is no indication for incision and drainage. The patient has been given antibiotics in the past and this felt completely entire course. I have strongly advised patient that she must continue all antibiotics until the completion. Patient is also to take probiotics along with her antibiotics to prevent GI complications.. Patient will be discharged home with prescriptions for clindamycin. Patient is to follow up with primary care provider if symptoms persist past this treatment course. Patient is given ED precautions to return to the ED for any worsening or new symptoms.     ____________________________________________  FINAL CLINICAL IMPRESSION(S) / ED DIAGNOSES  Final diagnoses:  Cellulitis of groin      NEW MEDICATIONS STARTED DURING  THIS VISIT:  New Prescriptions   CLINDAMYCIN (CLEOCIN) 300 MG CAPSULE    Take 1 capsule (300 mg total) by mouth 4 (four) times daily.   HYDROCODONE-ACETAMINOPHEN (NORCO/VICODIN) 5-325 MG TABLET    Take 1 tablet by mouth every 4 (four) hours as needed for moderate pain.        Delorise Royals Erion Weightman, PA-C 11/12/15 1610  Governor Rooks, MD 11/12/15 [phone]

## 2015-11-12 NOTE — ED Notes (Signed)
Reports abscess on right lower abd. Hx of the same.

## 2015-11-12 NOTE — Discharge Instructions (Signed)
Cellulitis °Cellulitis is an infection of the skin and the tissue beneath it. The infected area is usually red and tender. Cellulitis occurs most often in the arms and lower legs.  °CAUSES  °Cellulitis is caused by bacteria that enter the skin through cracks or cuts in the skin. The most common types of bacteria that cause cellulitis are staphylococci and streptococci. °SIGNS AND SYMPTOMS  °· Redness and warmth. °· Swelling. °· Tenderness or pain. °· Fever. °DIAGNOSIS  °Your health care provider can usually determine what is wrong based on a physical exam. Blood tests may also be done. °TREATMENT  °Treatment usually involves taking an antibiotic medicine. °HOME CARE INSTRUCTIONS  °· Take your antibiotic medicine as directed by your health care provider. Finish the antibiotic even if you start to feel better. °· Keep the infected arm or leg elevated to reduce swelling. °· Apply a warm cloth to the affected area up to 4 times per day to relieve pain. °· Take medicines only as directed by your health care provider. °· Keep all follow-up visits as directed by your health care provider. °SEEK MEDICAL CARE IF:  °· You notice red streaks coming from the infected area. °· Your red area gets larger or turns dark in color. °· Your bone or joint underneath the infected area becomes painful after the skin has healed. °· Your infection returns in the same area or another area. °· You notice a swollen bump in the infected area. °· You develop new symptoms. °· You have a fever. °SEEK IMMEDIATE MEDICAL CARE IF:  °· You feel very sleepy. °· You develop vomiting or diarrhea. °· You have a general ill feeling (malaise) with muscle aches and pains. °  °This information is not intended to replace advice given to you by your health care provider. Make sure you discuss any questions you have with your health care provider. ° ° °Probiotics  °WHAT ARE PROBIOTICS?  °Probiotics are the good bacteria and yeasts that live in your body and keep  you and your digestive system healthy. Probiotics also help your body's defense (immune) system and protect your body against bad bacterial growth.  °Certain foods contain probiotics, such as yogurt. Probiotics can also be purchased as a supplement. As with any supplement or drug, it is important to discuss its use with your health care provider.  °WHAT AFFECTS THE BALANCE OF BACTERIA IN MY BODY?  °The balance of bacteria in your body can be affected by:  °Antibiotic medicines. Antibiotics are sometimes necessary to treat infection. Unfortunately, they may kill good or friendly bacteria in your body as well as the bad bacteria. This may lead to stomach problems like diarrhea, gas, and cramping.  °Disease. Some conditions are the result of an overgrowth of bad bacteria, yeasts, parasites, or fungi. These conditions include:  °Infectious diarrhea.  °Stomach and respiratory infections.  °Skin infections.  °Irritable bowel syndrome (IBS).  °Inflammatory bowel diseases.  °Ulcer due to Helicobacter pylori (H. pylori) infection.  °Tooth decay and periodontal disease.  °Vaginal infections. °Stress and poor diet may also lower the good bacteria in your body.  °WHAT TYPE OF PROBIOTIC IS RIGHT FOR ME?  °Probiotics are available over the counter at your local pharmacy, health food, or grocery store. They come in many different forms, combinations of strains, and dosing strengths. Some may need to be refrigerated. Always read the label for storage and usage instructions.  °Specific strains have been shown to be more effective for certain conditions. Ask your   health care provider what option is best for you.  °WHY WOULD I NEED PROBIOTICS?  °There are many reasons your health care provider might recommend a probiotic supplement, including:  °Diarrhea.  °Constipation.  °IBS.  °Respiratory infections.  °Yeast infections.  °Acne, eczema, and other skin conditions.  °Frequent urinary tract infections (UTIs). °ARE THERE SIDE EFFECTS OF  PROBIOTICS?  °Some people experience mild side effects when taking probiotics. Side effects are usually temporary and may include:  °Gas.  °Bloating.  °Cramping. °Rarely, serious side effects, such as infection or immune system changes, may occur.  °WHAT ELSE DO I NEED TO KNOW ABOUT PROBIOTICS?  °There are many different strains of probiotics. Certain strains may be more effective depending on your condition. Probiotics are available in varying doses. Ask your health care provider which probiotic you should use and how often.  °If you are taking probiotics along with antibiotics, it is generally recommended to wait at least 2 hours between taking the antibiotic and taking the probiotic.  °FOR MORE INFORMATION:  °National Center for Complementary and Alternative Medicine http://nccam.nih.gov/  °This information is not intended to replace advice given to you by your health care provider. Make sure you discuss any questions you have with your health care provider.  °Document Released: 04/28/2014 Document Reviewed: 04/28/2014  °Elsevier Interactive Patient Education ©2016 [employer]  ° °  °Document Released: 07/11/2005 Document Revised: 06/22/2015 Document Reviewed: 12/17/2011 °Elsevier Interactive Patient Education ©2016 [employer]. ° °

## 2016-05-15 ENCOUNTER — Encounter: Payer: Self-pay | Admitting: Emergency Medicine

## 2016-05-15 ENCOUNTER — Emergency Department
Admission: EM | Admit: 2016-05-15 | Discharge: 2016-05-15 | Disposition: A | Discharge status: Home / Self Care | Payer: Medicaid Other | Attending: Emergency Medicine | Admitting: Emergency Medicine

## 2016-05-15 DIAGNOSIS — L02214 Cutaneous abscess of groin: Secondary | ICD-10-CM | POA: Diagnosis not present

## 2016-05-15 DIAGNOSIS — L02411 Cutaneous abscess of right axilla: Secondary | ICD-10-CM | POA: Insufficient documentation

## 2016-05-15 DIAGNOSIS — Z791 Long term (current) use of non-steroidal anti-inflammatories (NSAID): Secondary | ICD-10-CM | POA: Insufficient documentation

## 2016-05-15 DIAGNOSIS — L0291 Cutaneous abscess, unspecified: Secondary | ICD-10-CM

## 2016-05-15 DIAGNOSIS — F17200 Nicotine dependence, unspecified, uncomplicated: Secondary | ICD-10-CM | POA: Insufficient documentation

## 2016-05-15 DIAGNOSIS — L03314 Cellulitis of groin: Secondary | ICD-10-CM | POA: Diagnosis not present

## 2016-05-15 MED ORDER — CLINDAMYCIN HCL 300 MG PO CAPS: 300.0000 mg | ORAL_CAPSULE | Freq: Four times a day (QID) | ORAL | 0 refills | Status: AC

## 2016-05-15 MED ORDER — OXYCODONE-ACETAMINOPHEN 5-325 MG PO TABS: 1.0000 | ORAL_TABLET | Freq: Three times a day (TID) | ORAL | 0 refills | Status: DC | PRN

## 2016-05-15 NOTE — ED Notes (Signed)
States she developed a possible abscess area noted under right arm  Hx of same

## 2016-05-15 NOTE — ED Triage Notes (Signed)
Pt to ed with c/o right axillary pain and swelling x 3 days. Reports hx of abscess.

## 2016-05-15 NOTE — Discharge Instructions (Signed)
Take medication as prescribed. Rest. Drink plenty of fluids. Apply warm compresses.   Follow up with your primary care physician or surgery above as discussed. Return to Urgent care for new or worsening concerns.

## 2016-05-15 NOTE — ED Provider Notes (Signed)
[facility] ____________________________________________  Time seen: Approximately 12:40 PM  I have reviewed the triage vital signs and the nursing notes.   HISTORY  Chief Complaint Arm Pain   HPI Samantha Orr is a 30 y.o. female presents for evaluation of abscess to right axilla region. Patient reports she has a chronic history of similar. Patient reports this has been gradual onset over the last 3 days. Patient reports that the area is very tender to touch and has been swelling. Denies any drainage or redness. Patient reports multiple past abscesses as well as incisions and drainages for this. Patient reports that this abscess feels like it is deeper and not superficial. Patient was that she has been using warm compresses and attempts to bring it to a head unsuccessfully. Patient reports moderate tenderness in right axilla region at abscess location. Patient reports oral antibiotics generally clears this up.  Patient also reports that she has a smaller similar area to right inguinal area that has been actively draining. Patient reports this area has been present times several days. Patient reports history of similar in the same area in the past as well.  Patient reports that she had a few pills of 150 mg oral clindamycin that she started taking yesterday without any change. Denies fevers, chills, drainage, pain radiation, abdominal pain, nausea, vomiting, chest pain, shortness of breath or recent sickness. Patient reports last antibiotic prescription was given when she was last in the emergency room at the beginning of this year.  Patient's last menstrual period was 04/19/2016. Denies symptoms of pregnancy.     Past Medical History:  Diagnosis Date  . Abscess     There are no active problems to display for this patient.   Past Surgical History:  Procedure Laterality Date  . ABCESS DRAINAGE     BUTTOCK  . ABDOMINAL SURGERY    . CESAREAN SECTION    .  CHOLECYSTECTOMY    . HERNIA REPAIR    . TUBAL LIGATION    . TUBAL LIGATION      No current facility-administered medications for this encounter.   Current Outpatient Prescriptions:  .  clindamycin (CLEOCIN) 300 MG capsule, Take 1 capsule (300 mg total) by mouth 4 (four) times daily., Disp: 28 capsule, Rfl: 0 .  clindamycin (CLEOCIN) 300 MG capsule, Take 1 capsule (300 mg total) by mouth 4 (four) times daily., Disp: 40 capsule, Rfl: 0 .  HYDROcodone-acetaminophen (NORCO/VICODIN) 5-325 MG tablet, Take 1 tablet by mouth every 4 (four) hours as needed for moderate pain., Disp: 8 tablet, Rfl: 0 .  naproxen (NAPROSYN) 500 MG tablet, Take 1 tablet (500 mg total) by mouth 2 (two) times daily., Disp: 20 tablet, Rfl: 0 .  oxyCODONE-acetaminophen (ROXICET) 5-325 MG tablet, Take 1 tablet by mouth every 8 (eight) hours as needed for moderate pain or severe pain (Do not drive or operate heavy machinery while taking as can cause drowsiness.)., Disp: 8 tablet, Rfl: 0  Allergies Bactrim [sulfamethoxazole-trimethoprim]  No family history on file.  Social History Social History  Substance Use Topics  . Smoking status: Current Every Day Smoker    Packs/day: 0.50  . Smokeless tobacco: Never Used  . Alcohol use Yes    Review of Systems Constitutional: No fever/chills Eyes: No visual changes. ENT: No sore throat. Cardiovascular: Denies chest pain. Respiratory: Denies shortness of breath. Gastrointestinal: No abdominal pain.  No nausea, no vomiting.  No diarrhea.  No constipation. Genitourinary: Negative for dysuria. Musculoskeletal: Negative for back pain.  Skin: Negative for rash.As above. Neurological: Negative for headaches, focal weakness or numbness.  10-point ROS otherwise negative.  ____________________________________________   PHYSICAL EXAM:  VITAL SIGNS: ED Triage Vitals  Enc Vitals Group     BP 05/15/16 1151 124/69     Pulse Rate 05/15/16 1151 84     Resp 05/15/16 1151 18      Temp 05/15/16 1151 98.9 F (37.2 C)     Temp Source 05/15/16 1151 Oral     SpO2 05/15/16 1151 99 %     Weight 05/15/16 1151 208 lb (94.3 kg)     Height 05/15/16 1151 5\' 2"  (1.575 m)     Head Circumference --      Peak Flow --      Pain Score 05/15/16 1148 8     Pain Loc --      Pain Edu? --      Excl. in GC? --     Constitutional: Alert and oriented. Well appearing and in no acute distress. Eyes: Conjunctivae are normal. PERRL. EOMI. ENT      Head: Normocephalic and atraumatic.      Nose: No congestion/rhinnorhea.      Mouth/Throat: Mucous membranes are moist.Oropharynx non-erythematous. Neck: No stridor. Supple without meningismus.  Cardiovascular: Normal rate, regular rhythm. Grossly normal heart sounds.  Good peripheral circulation. Respiratory: Normal respiratory effort without tachypnea nor retractions. Breath sounds are clear and equal bilaterally. No wheezes/rales/rhonchi.. Gastrointestinal: Soft and nontender.  Musculoskeletal:  Nontender with normal range of motion in all extremities. Neurologic:  Normal speech and language. No gross focal neurologic deficits are appreciated. Speech is normal. No gait instability.  Skin:  Skin is warm, dry and intact. No rash noted. Except: Right axilla area of 4 x 5 cm of minimal erythema and moderate area induration, no fluctuance, no pointing abscess, no surrounding induration, no surrounding erythema, no active drainage, moderate tenderness to direct palpation, right upper extremity otherwise nontender, full range of motion to bilateral upper extremities. Except: Right upper lateral inguinal region 1 cm area of induration with active purulent drainage and mild surrounding erythema, no other erythema or lesion noted. Psychiatric: Mood and affect are normal. Speech and behavior are normal. Patient exhibits appropriate insight and judgment   ___________________________________________   LABS (all labs ordered are listed, but only abnormal  results are displayed)  Labs Reviewed - No data to display ____________________________________________  RADIOLOGY  No results found. ____________________________________________   PROCEDURES Procedures    INITIAL IMPRESSION / ASSESSMENT AND PLAN / ED COURSE  Pertinent labs & imaging results that were available during my care of the patient were reviewed by me and considered in my medical decision making (see chart for details).  Very well-appearing patient. No acute distress. Presents for the complaint of right axilla abscess and that she has a history of similar in the past. Also reported draining small abscess to right inguinal region. Patient with chronic history of abscesses in the past. Patient reports history of allergy to Bactrim. Patient reports last antibiotic prescription was when last seen in [facility] emergency room in January. Patient reports tolerates clindamycin well. Right axillary abscess without fluctuance or pointing abscess, no I&D indicated. Mild surrounding right axillary erythema. Patient also with right inguinal small actively draining abscess with mild surrounding cellulitis. Will treat patient with oral clindamycin, warm compresses, cleaning and close monitoring. Quantity #8 Percocet given. Encouraged patient to follow-up with primary care physician or surgery for follow up. Encouraged patient to take antibiotic with food  and probiotics.Discussed indication, risks and benefits of medications with patient.  Discussed follow up with Primary care physician this week. Discussed follow up and return parameters including no resolution or any worsening concerns. Patient verbalized understanding and agreed to plan.   ____________________________________________   FINAL CLINICAL IMPRESSION(S) / ED DIAGNOSES  Final diagnoses:  Abscess of multiple sites     Discharge Medication List as of 05/15/2016 12:44 PM    START taking these medications   Details  !!  clindamycin (CLEOCIN) 300 MG capsule Take 1 capsule (300 mg total) by mouth 4 (four) times daily., Starting Tue 05/15/2016, Until Fri 05/25/2016, Print    oxyCODONE-acetaminophen (ROXICET) 5-325 MG tablet Take 1 tablet by mouth every 8 (eight) hours as needed for moderate pain or severe pain (Do not drive or operate heavy machinery while taking as can cause drowsiness.)., Starting Tue 05/15/2016, Print     !! - Potential duplicate medications found. Please discuss with provider.      Note: This dictation was prepared with Dragon dictation along with smaller phrase technology. Any transcriptional errors that result from this process are unintentional.    Clinical Course      Renford Dills, NP 05/15/16 1318    Renford Dills, NP 05/15/16 1319

## 2016-05-22 ENCOUNTER — Encounter: Payer: Self-pay | Admitting: Surgery

## 2016-05-22 ENCOUNTER — Ambulatory Visit (INDEPENDENT_AMBULATORY_CARE_PROVIDER_SITE_OTHER): Payer: Medicaid Other | Admitting: Surgery

## 2016-05-22 VITALS — BP 118/65 | HR 68 | Temp 98.9°F | Ht 62.0 in | Wt 220.0 lb

## 2016-05-22 DIAGNOSIS — L732 Hidradenitis suppurativa: Secondary | ICD-10-CM

## 2016-05-22 NOTE — Patient Instructions (Signed)
Please see blue pre-care sheet for your surgery information. Please call our office if you have any questions or concerns.

## 2016-05-23 NOTE — Progress Notes (Signed)
Patient ID: Samantha Orr, female   DOB: 1986-09-14, 30 y.o.   MRN: [id number]  HPI Samantha Orr is a 30 y.o. female seen for recurrent episodes of hidradenitis and abscess is related to her hidradenitis. More recently over the last few days had an I&D of her right axilla by the emergency room and has been placed on antibiotics since then. She has had some significant improvement since then. She reports still some intermittent mild sharp pain on the right axilla that has not radiated. 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 Of note she has had a history of multiple I and D's in the past and multiple antibiotic therapy and now is very tired and frustrated from her chronic sinuses on her inguinal area. She does smoke every day and denies any diabetes or any major comorbidities. She does have a Foley history significant for hidradenitis as well . He does have good cardiovascular reserve and is able to perform more than 4 Mets of activity without any shortness of breath or chest pain He does have a history of a C-section and inguinal hernia repair  HPI  Past Medical History:  Diagnosis Date  . Abscess     Past Surgical History:  Procedure Laterality Date  . ABCESS DRAINAGE     BUTTOCK  . ABDOMINAL SURGERY    . CESAREAN SECTION    . CESAREAN SECTION  2012 and 2015  . CHOLECYSTECTOMY    . GALLBLADDER SURGERY  2016  . HERNIA REPAIR    . INGUINAL HERNIA REPAIR  2016  . TUBAL LIGATION    . TUBAL LIGATION    . TUBAL LIGATION  2016    Family History  Problem Relation Age of Onset  . Hypertension Mother   . Breast cancer Maternal Aunt   . Uterine cancer Maternal Grandmother   . Diabetes Maternal Grandfather     Social History Social History  Substance Use Topics  . Smoking status: Current Every Day Smoker    Packs/day: 1.00  . Smokeless tobacco: Never Used  . Alcohol use Yes    Allergies   Allergen Reactions  . Bactrim [Sulfamethoxazole-Trimethoprim] Hives    Current Outpatient Prescriptions  Medication Sig Dispense Refill  . clindamycin (CLEOCIN) 300 MG capsule Take 1 capsule (300 mg total) by mouth 4 (four) times daily. 28 capsule 0  . clindamycin (CLEOCIN) 300 MG capsule Take 1 capsule (300 mg total) by mouth 4 (four) times daily. 40 capsule 0  . HYDROcodone-acetaminophen (NORCO/VICODIN) 5-325 MG tablet Take 1 tablet by mouth every 4 (four) hours as needed for moderate pain. 8 tablet 0  . naproxen (NAPROSYN) 500 MG tablet Take 1 tablet (500 mg total) by mouth 2 (two) times daily. 20 tablet 0  . oxyCODONE-acetaminophen (ROXICET) 5-325 MG tablet Take 1 tablet by mouth every 8 (eight) hours as needed for moderate pain or severe pain (Do not drive or operate heavy machinery while taking as can cause drowsiness.). 8 tablet 0   No current facility-administered medications for this visit.      Review of Systems A 10 point review of systems was asked and was negative except for the information on the HPI  Physical Exam Blood pressure 118/65, pulse 68, temperature 98.9 F (37.2 C), temperature source Oral, height 5\' 2"  (1.575 m), weight 99.8 kg (220 lb), last menstrual period 04/19/2016, unknown if currently breastfeeding. CONSTITUTIONAL: NAD, non toxic EYES:  Pupils are equal, round, and reactive to light, Sclera are non-icteric. EARS, NOSE, MOUTH AND THROAT: The oropharynx is clear. The oral mucosa is pink and moist. Hearing is intact to voice. LYMPH NODES:  Lymph nodes in the neck are normal. RESPIRATORY:  Lungs are clear. There is normal respiratory effort, with equal breath sounds bilaterally, and without pathologic use of accessory muscles. CARDIOVASCULAR: Heart is regular without murmurs, gallops, or rubs. GI: The abdomen is  soft, nontender, and nondistended. There are no palpable masses. There is no hepatosplenomegaly. There are normal bowel sounds in all  quadrants. GU: Rectal deferred.   MUSCULOSKELETAL: Normal muscle strength and tone. No cyanosis or edema.   SKIN:  There is an indurated area on the right axilla measuring 3 cm and extended to palpation but no evidence of fluctuance. The packing was removed and there is a cavity for the abscess. On her bilateral groins there are multiple small draining sinuses from hidradenitis with some white discharge. There is no evidence of specific abscesses that need to be drained at this time. NEUROLOGIC: Motor and sensation is grossly normal. Cranial nerves are grossly intact. PSYCH:  Oriented to person, place and time. Affect is normal.  Data Reviewed I have personally reviewed the patient's imaging, laboratory findings and medical records.    Assessment Plan Recurrent complicated hidradenitis and failed antibiotic therapy. Discussed with the patient in detail at this point and we'll go ahead and schedule her for excision of hidradenitis sinuses on bilateral groins. If her right axilla deteriorates we'll go ahead and also perform an excision of the hidradenitis sinuses. If her condition improves we will only do the groins. Discussed with her in detail about the procedure, risks, benefits and possible complications including but not limited to: Bleeding, recurrence, infection, re-intervention. I specifically described to her that this is chronic disease and we can only control the active and infected sinuses. I will also explained to her in detail that this wound will take several weeks to heal and that she should quit smoking to improve her chances of optimal wound care. She understands and she understands and wishes to proceed. We will schedule her in a couple weeks for excision of hidradenitis sinuses Sterling Big, MD FACS General Surgeon 05/23/2016, 1:29 AM

## 2016-05-24 ENCOUNTER — Telehealth: Payer: Self-pay | Admitting: Surgery

## 2016-05-24 NOTE — Telephone Encounter (Signed)
LVM for patient to call office.

## 2016-05-24 NOTE — Telephone Encounter (Signed)
Pt advised of pre op date/time and sx date. Sx: 06/05/16 with Dr Maryruth HancockPabon--bilateral excision hidradenitis groin and axilla.  Pre op: 05/28/16 @ 11:15am--Office.   Patient made aware to call [phone], between 1-3:00pm the day before surgery, to find out what time to arrive.     Please call patient. She has stated that she has a white pearl like ball that has came out of the axilla area. I have advised her that this is part of the hidradenitis coming out and that this is normal.

## 2016-05-25 NOTE — Telephone Encounter (Signed)
Spoke with patient at this time. She denies fever, any unusual redness, no odor, no pus drainage at this time. Patient was advised to call office if signs or symptoms of infections occur. Patient verbalized understanding.

## 2016-05-28 ENCOUNTER — Encounter
Admission: RE | Admit: 2016-05-28 | Discharge: 2016-05-28 | Disposition: A | Discharge status: Home / Self Care | Payer: Medicaid Other | Source: Ambulatory Visit | Attending: Surgery | Admitting: Surgery

## 2016-05-28 DIAGNOSIS — Z01818 Encounter for other preprocedural examination: Secondary | ICD-10-CM | POA: Insufficient documentation

## 2016-05-28 HISTORY — DX: Other complications of anesthesia, initial encounter: T88.59XA

## 2016-05-28 HISTORY — DX: Adverse effect of unspecified anesthetic, initial encounter: T41.45XA

## 2016-05-28 HISTORY — DX: Gastro-esophageal reflux disease without esophagitis: K21.9

## 2016-05-28 NOTE — Patient Instructions (Signed)
  Your procedure is scheduled on: 06/05/16 Report to Day Surgery. To find out your arrival time please call [phone] between 1PM - 3PM on 06/04/16.  Remember: Instructions that are not followed completely may result in serious medical risk, up to and including death, or upon the discretion of your surgeon and anesthesiologist your surgery may need to be rescheduled.    __X__ 1. Do not eat food or drink liquids after midnight. No gum chewing or hard candies.     __X__ 2. No Alcohol for 24 hours before or after surgery.   __X__ 3. Do Not Smoke For 24 Hours Prior to Your Surgery.   ____ 4. Bring all medications with you on the day of surgery if instructed.    __X__ 5. Notify your doctor if there is any change in your medical condition     (cold, fever, infections).       Do not wear jewelry, make-up, hairpins, clips or nail polish.  Do not wear lotions, powders, or perfumes. You may wear deodorant.  Do not shave 48 hours prior to surgery. Men may shave face and neck.  Do not bring valuables to the hospital.    [facility] not [facility] any belongings or valuables.               Contacts, dentures or bridgework may not be worn into surgery.  Leave your suitcase in the car. After surgery it may be brought to your room.  For patients admitted to the hospital, discharge time is determined by your                treatment team.   Patients discharged the day of surgery will not be allowed to drive home.   Please read over the following fact sheets that you were given:   Surgical Site Infection Prevention   ____ Take these medicines the morning of surgery with A SIP OF WATER:    1. NONE  2.   3.   4.  5.  6.  ____ Fleet Enema (as directed)   _X_ Use CHG Soap as directed  ____ Use inhalers on the day of surgery  ____ Stop metformin 2 days prior to surgery    ____ Take 1/2 of usual insulin dose the night before surgery and none on the morning of surgery.   ____  Stop Coumadin/Plavix/aspirin on   ____ Stop Anti-inflammatories on    ____ Stop supplements until after surgery.    ____ Bring C-Pap to the hospital.

## 2016-06-05 ENCOUNTER — Ambulatory Visit: Payer: Medicaid Other | Admitting: *Deleted

## 2016-06-05 ENCOUNTER — Encounter: Payer: Self-pay | Admitting: Anesthesiology

## 2016-06-05 ENCOUNTER — Encounter
Admission: RE | Disposition: A | Discharge status: Home / Self Care | Payer: Self-pay | Source: Ambulatory Visit | Attending: Surgery

## 2016-06-05 ENCOUNTER — Ambulatory Visit
Admission: RE | Admit: 2016-06-05 | Discharge: 2016-06-05 | Disposition: A | Discharge status: Home / Self Care | Payer: Medicaid Other | Source: Ambulatory Visit | Attending: Surgery | Admitting: Surgery

## 2016-06-05 DIAGNOSIS — L732 Hidradenitis suppurativa: Secondary | ICD-10-CM

## 2016-06-05 DIAGNOSIS — Z79899 Other long term (current) drug therapy: Secondary | ICD-10-CM | POA: Diagnosis not present

## 2016-06-05 DIAGNOSIS — Z9049 Acquired absence of other specified parts of digestive tract: Secondary | ICD-10-CM | POA: Insufficient documentation

## 2016-06-05 DIAGNOSIS — Z803 Family history of malignant neoplasm of breast: Secondary | ICD-10-CM | POA: Insufficient documentation

## 2016-06-05 DIAGNOSIS — Z8049 Family history of malignant neoplasm of other genital organs: Secondary | ICD-10-CM | POA: Diagnosis not present

## 2016-06-05 DIAGNOSIS — Z881 Allergy status to other antibiotic agents status: Secondary | ICD-10-CM | POA: Insufficient documentation

## 2016-06-05 DIAGNOSIS — F17200 Nicotine dependence, unspecified, uncomplicated: Secondary | ICD-10-CM | POA: Insufficient documentation

## 2016-06-05 DIAGNOSIS — Z833 Family history of diabetes mellitus: Secondary | ICD-10-CM | POA: Insufficient documentation

## 2016-06-05 HISTORY — PX: HYDRADENITIS EXCISION: SHX5243

## 2016-06-05 HISTORY — DX: Reserved for inherently not codable concepts without codable children: IMO0001

## 2016-06-05 LAB — POCT PREGNANCY, URINE: Preg Test, Ur: NEGATIVE

## 2016-06-05 SURGERY — EXCISION, HIDRADENITIS, INGUINAL REGION
Anesthesia: General | Laterality: Bilateral

## 2016-06-05 MED ORDER — LACTATED RINGERS IV SOLN
INTRAVENOUS | Status: DC
  Administered 2016-06-05: 75 mL/h via INTRAVENOUS

## 2016-06-05 MED ORDER — BUPIVACAINE-EPINEPHRINE 0.25% -1:200000 IJ SOLN
INTRAMUSCULAR | Status: DC | PRN
  Administered 2016-06-05: 30 mL

## 2016-06-05 MED ORDER — FENTANYL CITRATE (PF) 100 MCG/2ML IJ SOLN
25.0000 ug | INTRAMUSCULAR | Status: DC | PRN
  Administered 2016-06-05 (×3): 50 ug via INTRAVENOUS

## 2016-06-05 MED ORDER — MIDAZOLAM HCL 2 MG/2ML IJ SOLN
INTRAMUSCULAR | Status: DC | PRN
  Administered 2016-06-05: 2 mg via INTRAVENOUS

## 2016-06-05 MED ORDER — SODIUM CHLORIDE 0.9 % IR SOLN
Status: DC | PRN
  Administered 2016-06-05: 10 mL

## 2016-06-05 MED ORDER — OXYCODONE HCL 5 MG PO TABS
ORAL_TABLET | ORAL | Status: AC
  Filled 2016-06-05: qty 1

## 2016-06-05 MED ORDER — ESMOLOL HCL 100 MG/10ML IV SOLN
INTRAVENOUS | Status: DC | PRN
  Administered 2016-06-05: 30 mg via INTRAVENOUS

## 2016-06-05 MED ORDER — SUCCINYLCHOLINE CHLORIDE 20 MG/ML IJ SOLN
INTRAMUSCULAR | Status: DC | PRN
  Administered 2016-06-05: 140 mg via INTRAVENOUS

## 2016-06-05 MED ORDER — FENTANYL CITRATE (PF) 100 MCG/2ML IJ SOLN
INTRAMUSCULAR | Status: DC | PRN
  Administered 2016-06-05: 100 ug via INTRAVENOUS

## 2016-06-05 MED ORDER — PROPOFOL 10 MG/ML IV BOLUS
INTRAVENOUS | Status: DC | PRN
  Administered 2016-06-05: 150 mg via INTRAVENOUS
  Administered 2016-06-05: 100 mg via INTRAVENOUS

## 2016-06-05 MED ORDER — FAMOTIDINE 20 MG PO TABS
ORAL_TABLET | ORAL | Status: AC
Start: 2016-06-05 — End: 2016-06-05
  Administered 2016-06-05: 20 mg via ORAL
  Filled 2016-06-05: qty 1

## 2016-06-05 MED ORDER — ONDANSETRON HCL 4 MG/2ML IJ SOLN
INTRAMUSCULAR | Status: DC | PRN
Start: 2016-06-05 — End: 2016-06-05
  Administered 2016-06-05: 4 mg via INTRAVENOUS

## 2016-06-05 MED ORDER — FAMOTIDINE 20 MG PO TABS
20.0000 mg | ORAL_TABLET | Freq: Once | ORAL | Status: AC
  Administered 2016-06-05: 20 mg via ORAL

## 2016-06-05 MED ORDER — OXYCODONE HCL 5 MG/5ML PO SOLN: 5.0000 mg | Freq: Once | ORAL | Status: AC | PRN

## 2016-06-05 MED ORDER — FENTANYL CITRATE (PF) 100 MCG/2ML IJ SOLN
INTRAMUSCULAR | Status: AC
  Administered 2016-06-05: 50 ug via INTRAVENOUS
  Filled 2016-06-05: qty 2

## 2016-06-05 MED ORDER — DEXTROSE 5 % IV SOLN
2.0000 g | INTRAVENOUS | Status: AC
  Administered 2016-06-05: 2 g via INTRAVENOUS
  Filled 2016-06-05: qty 2

## 2016-06-05 MED ORDER — CHLORHEXIDINE GLUCONATE CLOTH 2 % EX PADS: 6.0000 | MEDICATED_PAD | Freq: Once | CUTANEOUS | Status: DC

## 2016-06-05 MED ORDER — BUPIVACAINE-EPINEPHRINE (PF) 0.25% -1:200000 IJ SOLN
INTRAMUSCULAR | Status: AC
  Filled 2016-06-05: qty 30

## 2016-06-05 MED ORDER — OXYCODONE-ACETAMINOPHEN 7.5-325 MG PO TABS: 1.0000 | ORAL_TABLET | ORAL | 0 refills | Status: DC | PRN

## 2016-06-05 MED ORDER — OXYCODONE HCL 5 MG PO TABS
5.0000 mg | ORAL_TABLET | Freq: Once | ORAL | Status: AC | PRN
  Administered 2016-06-05: 5 mg via ORAL

## 2016-06-05 SURGICAL SUPPLY — 25 items
BLADE SURG 15 STRL LF DISP TIS (BLADE) ×1 IMPLANT
BLADE SURG 15 STRL SS (BLADE) ×2
CANISTER SUCT 1200ML W/VALVE (MISCELLANEOUS) ×3 IMPLANT
CHLORAPREP W/TINT 26ML (MISCELLANEOUS) ×3 IMPLANT
DRAPE LAPAROTOMY 77X122 PED (DRAPES) ×3 IMPLANT
DRAPE LAPAROTOMY TRNSV 106X77 (MISCELLANEOUS) ×3 IMPLANT
GAUZE IODOFORM PACK 1/2 7832 (GAUZE/BANDAGES/DRESSINGS) ×6 IMPLANT
GAUZE SPONGE 4X4 12PLY STRL (GAUZE/BANDAGES/DRESSINGS) ×6 IMPLANT
GLOVE BIO SURGEON STRL SZ7 (GLOVE) ×6 IMPLANT
GOWN STRL REUS W/ TWL LRG LVL3 (GOWN DISPOSABLE) ×2 IMPLANT
GOWN STRL REUS W/TWL LRG LVL3 (GOWN DISPOSABLE) ×4
LABEL OR SOLS (LABEL) ×3 IMPLANT
LIQUID BAND (GAUZE/BANDAGES/DRESSINGS) IMPLANT
NDL SAFETY 22GX1.5 (NEEDLE) ×3 IMPLANT
NS IRRIG 500ML POUR BTL (IV SOLUTION) ×3 IMPLANT
PACK BASIN MINOR ARMC (MISCELLANEOUS) ×3 IMPLANT
SPONGE LAP 18X18 5 PK (GAUZE/BANDAGES/DRESSINGS) ×3 IMPLANT
SUT MNCRL 4-0 (SUTURE)
SUT MNCRL 4-0 27XMFL (SUTURE)
SUT VIC AB 2-0 CT2 27 (SUTURE) IMPLANT
SUTURE MNCRL 4-0 27XMF (SUTURE) IMPLANT
SYR 20CC LL (SYRINGE) ×3 IMPLANT
SYR BULB IRRIG 60ML STRL (SYRINGE) ×3 IMPLANT
SYRINGE 10CC LL (SYRINGE) ×3 IMPLANT
TOWEL OR 17X26 4PK STRL BLUE (TOWEL DISPOSABLE) ×3 IMPLANT

## 2016-06-05 NOTE — Transfer of Care (Signed)
Immediate Anesthesia Transfer of Care Note  Patient: Samantha Orr  Procedure(s) Performed: Procedure(s): , right axilla (Bilateral)  Patient Location: PACU  Anesthesia Type:General  Level of Consciousness: awake, alert  and oriented  Airway & Oxygen Therapy: Patient Spontanous Breathing and Patient connected to face mask  Post-op Assessment: Report given to RN and Post -op Vital signs reviewed and stable  Post vital signs: Reviewed and stable  Last Vitals:  Vitals:   06/05/16 0856 06/05/16 1050  BP: 112/72   Pulse: 69   Resp: 16   Temp: 36.6 C (!) (P) 35.9 C    Last Pain:  Vitals:   06/05/16 1050  TempSrc:   PainSc: (P) Asleep      Patients Stated Pain Goal: 0 (06/05/16 0856)  Complications: No apparent anesthesia complications

## 2016-06-05 NOTE — H&P (View-Only) (Signed)
Patient ID: Samantha Orr, female   DOB: 1986-08-17, 30 y.o.   MRN: [id number]  HPI Samantha Orr is a 30 y.o. female seen for recurrent episodes of hidradenitis and abscess is related to her hidradenitis. More recently over the last few days had an I&D of her right axilla by the emergency room and has been placed on antibiotics since then. She has had some significant improvement since then. She reports still some intermittent mild sharp pain on the right axilla that has not radiated. 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 Of note she has had a history of multiple I and D's in the past and multiple antibiotic therapy and now is very tired and frustrated from her chronic sinuses on her inguinal area. She does smoke every day and denies any diabetes or any major comorbidities. She does have a Foley history significant for hidradenitis as well . He does have good cardiovascular reserve and is able to perform more than 4 Mets of activity without any shortness of breath or chest pain He does have a history of a C-section and inguinal hernia repair  HPI  Past Medical History:  Diagnosis Date  . Abscess     Past Surgical History:  Procedure Laterality Date  . ABCESS DRAINAGE     BUTTOCK  . ABDOMINAL SURGERY    . CESAREAN SECTION    . CESAREAN SECTION  2012 and 2015  . CHOLECYSTECTOMY    . GALLBLADDER SURGERY  2016  . HERNIA REPAIR    . INGUINAL HERNIA REPAIR  2016  . TUBAL LIGATION    . TUBAL LIGATION    . TUBAL LIGATION  2016    Family History  Problem Relation Age of Onset  . Hypertension Mother   . Breast cancer Maternal Aunt   . Uterine cancer Maternal Grandmother   . Diabetes Maternal Grandfather     Social History Social History  Substance Use Topics  . Smoking status: Current Every Day Smoker    Packs/day: 1.00  . Smokeless tobacco: Never Used  . Alcohol use Yes    Allergies   Allergen Reactions  . Bactrim [Sulfamethoxazole-Trimethoprim] Hives    Current Outpatient Prescriptions  Medication Sig Dispense Refill  . clindamycin (CLEOCIN) 300 MG capsule Take 1 capsule (300 mg total) by mouth 4 (four) times daily. 28 capsule 0  . clindamycin (CLEOCIN) 300 MG capsule Take 1 capsule (300 mg total) by mouth 4 (four) times daily. 40 capsule 0  . HYDROcodone-acetaminophen (NORCO/VICODIN) 5-325 MG tablet Take 1 tablet by mouth every 4 (four) hours as needed for moderate pain. 8 tablet 0  . naproxen (NAPROSYN) 500 MG tablet Take 1 tablet (500 mg total) by mouth 2 (two) times daily. 20 tablet 0  . oxyCODONE-acetaminophen (ROXICET) 5-325 MG tablet Take 1 tablet by mouth every 8 (eight) hours as needed for moderate pain or severe pain (Do not drive or operate heavy machinery while taking as can cause drowsiness.). 8 tablet 0   No current facility-administered medications for this visit.      Review of Systems A 10 point review of systems was asked and was negative except for the information on the HPI  Physical Exam Blood pressure 118/65, pulse 68, temperature 98.9 F (37.2 C), temperature source Oral, height 5\' 2"  (1.575 m), weight 99.8 kg (220 lb), last menstrual period 04/19/2016, unknown if currently breastfeeding. CONSTITUTIONAL: NAD, non toxic EYES:  Pupils are equal, round, and reactive to light, Sclera are non-icteric. EARS, NOSE, MOUTH AND THROAT: The oropharynx is clear. The oral mucosa is pink and moist. Hearing is intact to voice. LYMPH NODES:  Lymph nodes in the neck are normal. RESPIRATORY:  Lungs are clear. There is normal respiratory effort, with equal breath sounds bilaterally, and without pathologic use of accessory muscles. CARDIOVASCULAR: Heart is regular without murmurs, gallops, or rubs. GI: The abdomen is  soft, nontender, and nondistended. There are no palpable masses. There is no hepatosplenomegaly. There are normal bowel sounds in all  quadrants. GU: Rectal deferred.   MUSCULOSKELETAL: Normal muscle strength and tone. No cyanosis or edema.   SKIN:  There is an indurated area on the right axilla measuring 3 cm and extended to palpation but no evidence of fluctuance. The packing was removed and there is a cavity for the abscess. On her bilateral groins there are multiple small draining sinuses from hidradenitis with some white discharge. There is no evidence of specific abscesses that need to be drained at this time. NEUROLOGIC: Motor and sensation is grossly normal. Cranial nerves are grossly intact. PSYCH:  Oriented to person, place and time. Affect is normal.  Data Reviewed I have personally reviewed the patient's imaging, laboratory findings and medical records.    Assessment Plan Recurrent complicated hidradenitis and failed antibiotic therapy. Discussed with the patient in detail at this point and we'll go ahead and schedule her for excision of hidradenitis sinuses on bilateral groins. If her right axilla deteriorates we'll go ahead and also perform an excision of the hidradenitis sinuses. If her condition improves we will only do the groins. Discussed with her in detail about the procedure, risks, benefits and possible complications including but not limited to: Bleeding, recurrence, infection, re-intervention. I specifically described to her that this is chronic disease and we can only control the active and infected sinuses. I will also explained to her in detail that this wound will take several weeks to heal and that she should quit smoking to improve her chances of optimal wound care. She understands and she understands and wishes to proceed. We will schedule her in a couple weeks for excision of hidradenitis sinuses Sterling Bigiego Pabon, MD FACS General Surgeon 05/23/2016, 1:29 AM

## 2016-06-05 NOTE — Interval H&P Note (Signed)
History and Physical Interval Note:  06/05/2016 9:06 AM  Samantha Orr  has presented today for surgery, with the diagnosis of Hidradenitis  The various methods of treatment have been discussed with the patient and family. After consideration of risks, benefits and other options for treatment, the patient has consented to  Procedure(s): EXCISION HIDRADENITIS GROIN possible axilla (Bilateral) as a surgical intervention .  The patient's history has been reviewed, patient examined, no change in status, stable for surgery.  I have reviewed the patient's chart and labs.  Questions were answered to the patient's satisfaction.   We will go ahead and excise the hidradenitis sinuses on the right axilla as well since there still continues drainage despite A/Bs.   Xue Low F Javonn Gauger

## 2016-06-05 NOTE — Anesthesia Procedure Notes (Signed)
Procedure Name: Intubation Date/Time: 06/05/2016 9:50 AM Performed by: Henrietta HooverPOPE, Rylan Bernard Pre-anesthesia Checklist: Patient identified, Patient being monitored, Timeout performed, Emergency Drugs available and Suction available Patient Re-evaluated:Patient Re-evaluated prior to inductionOxygen Delivery Method: Circle system utilized Preoxygenation: Pre-oxygenation with 100% oxygen Intubation Type: IV induction Ventilation: Mask ventilation without difficulty Laryngoscope Size: Mac and 3 Grade View: Grade I Tube type: Oral Tube size: 7.0 mm Number of attempts: 1 Airway Equipment and Method: Stylet Placement Confirmation: ETT inserted through vocal cords under direct vision,  positive ETCO2 and breath sounds checked- equal and bilateral Secured at: 22 cm Tube secured with: Tape Dental Injury: Teeth and Oropharynx as per pre-operative assessment

## 2016-06-05 NOTE — OR Nursing (Signed)
1/2 " packing each incision

## 2016-06-05 NOTE — Anesthesia Preprocedure Evaluation (Signed)
Anesthesia Evaluation  Patient identified by MRN, date of birth, ID band Patient awake    Reviewed: Allergy & Precautions, H&P , NPO status , Patient's Chart, lab work & pertinent test results  History of Anesthesia Complications (+) history of anesthetic complications  Airway Mallampati: II  TM Distance: >3 FB Neck ROM: full    Dental no notable dental hx. (+) Poor Dentition, Chipped   Pulmonary neg shortness of breath, Current Smoker,    Pulmonary exam normal breath sounds clear to auscultation       Cardiovascular Exercise Tolerance: Good (-) angina(-) Past MI and (-) DOE negative cardio ROS Normal cardiovascular exam Rhythm:regular Rate:Normal     Neuro/Psych negative neurological ROS  negative psych ROS   GI/Hepatic Neg liver ROS, GERD  Controlled,  Endo/Other  negative endocrine ROS  Renal/GU      Musculoskeletal   Abdominal   Peds  Hematology negative hematology ROS (+)   Anesthesia Other Findings Past Medical History: No date: Abscess No date: Complication of anesthesia     Comment: hr/bp dropped after c/s 2015 in pacu   had               spinal No date: GERD (gastroesophageal reflux disease)     Comment: sometimes throws up when gas gets in her chest  Past Surgical History: No date: ABCESS DRAINAGE     Comment: BUTTOCK No date: ABDOMINAL SURGERY No date: CESAREAN SECTION 2012 and 2015: CESAREAN SECTION No date: CHOLECYSTECTOMY No date: DILATION AND CURETTAGE OF UTERUS 2016: GALLBLADDER SURGERY No date: HERNIA REPAIR 2016: INGUINAL HERNIA REPAIR No date: TUBAL LIGATION No date: TUBAL LIGATION 2016: TUBAL LIGATION     Reproductive/Obstetrics negative OB ROS                             Anesthesia Physical Anesthesia Plan  ASA: III  Anesthesia Plan: General LMA   Post-op Pain Management:    Induction:   Airway Management Planned:   Additional Equipment:    Intra-op Plan:   Post-operative Plan:   Informed Consent: I have reviewed the patients History and Physical, chart, labs and discussed the procedure including the risks, benefits and alternatives for the proposed anesthesia with the patient or authorized representative who has indicated his/her understanding and acceptance.     Plan Discussed with: Anesthesiologist, CRNA and Surgeon  Anesthesia Plan Comments:         Anesthesia Quick Evaluation

## 2016-06-05 NOTE — Anesthesia Procedure Notes (Signed)
Procedure Name: Intubation Date/Time: 06/05/2016 10:50 AM Performed by: Edyth GunnelsGILBERT, Tazia Illescas Pre-anesthesia Checklist: Patient identified, Emergency Drugs available, Suction available, Patient being monitored and Timeout performed Patient Re-evaluated:Patient Re-evaluated prior to inductionOxygen Delivery Method: Circle system utilized Preoxygenation: Pre-oxygenation with 100% oxygen Intubation Type: IV induction Laryngoscope Size: Mac and 3 Grade View: Grade II Tube type: Oral Tube size: 7.0 mm Number of attempts: 1 Airway Equipment and Method: Stylet Placement Confirmation: ETT inserted through vocal cords under direct vision,  positive ETCO2 and breath sounds checked- equal and bilateral Secured at: 22 cm Tube secured with: Tape Dental Injury: Teeth and Oropharynx as per pre-operative assessment

## 2016-06-05 NOTE — Anesthesia Postprocedure Evaluation (Signed)
Anesthesia Post Note  Patient: Samantha Orr  Procedure(s) Performed: Procedure(s) (LRB): , right axilla (Bilateral)  Patient location during evaluation: PACU Anesthesia Type: General Level of consciousness: awake and alert Pain management: pain level controlled Vital Signs Assessment: post-procedure vital signs reviewed and stable Respiratory status: spontaneous breathing, nonlabored ventilation, respiratory function stable and patient connected to nasal cannula oxygen Cardiovascular status: blood pressure returned to baseline and stable Postop Assessment: no signs of nausea or vomiting Anesthetic complications: no    Last Vitals:  Vitals:   06/05/16 1120 06/05/16 1130  BP: (!) 143/81 118/82  Pulse: 98 80  Resp: 17 16  Temp:  36.2 C    Last Pain:  Vitals:   06/05/16 1130  TempSrc:   PainSc: 1                  Cleda MccreedyJoseph K Fleming Prill

## 2016-06-05 NOTE — Op Note (Signed)
  06/05/2016  10:25 AM  PATIENT:  Samantha Orr  30 y.o. female  PRE-OPERATIVE DIAGNOSIS:  Complex hidradenitis on the right axilla and bilateral groins  POST-OPERATIVE DIAGNOSIS:  Same  PROCEDURE:  1.Excision of multiple hidradenitis sinuses on bilateral groins and right axilla. Total of 2 areas on her right groin                           2. Excisional debridement of skin subcutaneous tissue                            measuring a total 8 square centimeters    SURGEON:  Surgeon(s) and Role:    * Jaidev Sanger F Kiyoshi Schaab, MD - Primary    ANESTHESIA: GETA  INDICATIONS FOR PROCEDURE hidradenitis  DICTATION:  Patient was playing about proceeding detail, risk benefits possible complications and a consent was obtained. The patient taken to the operating room and placed in the supine position. Multiple sinus were excised in elliptical fashion incorporating the skin and subcutaneous tissue, areas excised were on the right axilla and bilateral groins. Using a sharp curette we debrided the sub q tissue . Hemostasis was obtained with electrocautery. Irrigation with normal saline and the wound was packed with half-inch packing. Marcaine quarter percent with epinephrine was injected around the wound site. Needle and laparotomy counts were correct and there were no immediate complications  Leafy Roiego F Roderick Calo, MD

## 2016-06-06 ENCOUNTER — Encounter: Payer: Self-pay | Admitting: Surgery

## 2016-06-06 LAB — SURGICAL PATHOLOGY

## 2016-06-13 ENCOUNTER — Other Ambulatory Visit: Payer: Self-pay

## 2016-06-15 ENCOUNTER — Encounter: Payer: Self-pay | Admitting: Surgery

## 2016-06-15 ENCOUNTER — Ambulatory Visit (INDEPENDENT_AMBULATORY_CARE_PROVIDER_SITE_OTHER): Payer: Medicaid Other | Admitting: Surgery

## 2016-06-15 VITALS — BP 109/76 | HR 84 | Temp 97.4°F | Ht 62.0 in | Wt 218.8 lb

## 2016-06-15 DIAGNOSIS — R1084 Generalized abdominal pain: Secondary | ICD-10-CM

## 2016-06-15 NOTE — Patient Instructions (Addendum)
We have scheduled 06/26/16- 0930. [facility] [id number] Nothing to eat or drink 4 hours prior to your scan. Please [facility] once you are done with your appointment to pick up your contrast.  Directions to Medical Mall: When leaving our office, go right. Go all of the way down to the very end of the hallway. You will have a purple wall in front of you. You will now have a tunnel to the hospital on your left hand side. Go through this tunnel and the elevators will be on your left. Go down to the 1st floor and take a slight left. The 2nd desk on the right hand side is the Radiology desk.  We are requesting records from your prior Hernia Surgery as well.  We will have you see Dr. Everlene FarrierPabon in 3 weeks as scheduled below.

## 2016-06-15 NOTE — Progress Notes (Signed)
HPI: S/p vision of hidradenitis sinuses with debridement She was concerned because there was some black areas on the wound. I did explain to her that this was from cautery and was not infected or more as she thought it wise. She has been otherwise doing well from the wound standpoint of view. Today not she's got a new complaint of lower intermittent moderate abdominal pain. She had had a previous hernia surgery and she reports that she thinks that the hernia has come back. There is no evidence of incarceration or strangulation. She is taking by mouth and having bowel movements. He is afebrile  PE NAD, obese female Skin: Wounds from right axilla and bilateral groins are healing well without evidence of infection. Packing replaced. No evidence of undrained collection or evidence of infection. Abdomen: Soft diffuse abdominal tenderness without peritoneal signs. I cannot palpate an abdominal wall defect. No obvious inguinal hernias. Ext: well perfused, no edema  A/P Hidradenitis doing very well, responsive to I&D.  Now she has new Abdominal pain and questionable abdominal wall hernias. We will obtain records from outside facility where she had the previous operation and also obtain an CT scan of the abdomen and pelvis to rule out any hernias.

## 2016-06-20 ENCOUNTER — Telehealth: Payer: Self-pay | Admitting: Surgery

## 2016-06-20 NOTE — Telephone Encounter (Signed)
Patient had surgery for hidradenitis on 8/22 and wants to know if she can go swimming?

## 2016-06-20 NOTE — Telephone Encounter (Signed)
Patient advised against swimming or submerging in a pool at this time. She stated she still has drainage from her Hidradenitis. We discussed the risk of these areas becoming infected. Patient verbalized understanding.

## 2016-06-26 ENCOUNTER — Ambulatory Visit: Admission: RE | Admit: 2016-06-26 | Payer: Medicaid Other | Source: Ambulatory Visit

## 2016-06-26 ENCOUNTER — Telehealth: Payer: Self-pay | Admitting: Surgery

## 2016-06-26 NOTE — Telephone Encounter (Signed)
Patient left a voice message that she did not go to her appointment this morning and wants you to call her and reschedule it.

## 2016-06-26 NOTE — Telephone Encounter (Signed)
Spoke with patient at this time. She did not have her CT done this morning, however I have cancelled her appointment for this Thursday due to not having the CT .  Number provided for patient to call and reschedule her appointment and she said she would call back and let us know when this has been done.  I will make her a follow up appointment once her CT appointment has been made.

## 2016-06-27 ENCOUNTER — Telehealth: Payer: Self-pay

## 2016-06-27 NOTE — Telephone Encounter (Signed)
LVM for patient at this time to call office in regards to scheduling a Follow up appointment after 07/06/16 with Dr.Pabon to review CT scan results from 07/06/16.

## 2016-06-27 NOTE — Telephone Encounter (Signed)
LVM at this time on numerous numbers Mother provided for patient to call office so that we can get a follow up appointment scheduled with DR.Pabon to discuss CT results. [phone]

## 2016-06-28 ENCOUNTER — Encounter: Payer: Self-pay | Admitting: Surgery

## 2016-07-06 ENCOUNTER — Ambulatory Visit
Admission: RE | Admit: 2016-07-06 | Discharge: 2016-07-06 | Disposition: A | Discharge status: Home / Self Care | Payer: Medicaid Other | Source: Ambulatory Visit | Attending: Surgery | Admitting: Surgery

## 2016-07-06 ENCOUNTER — Ambulatory Visit: Admission: RE | Admit: 2016-07-06 | Payer: Medicaid Other | Source: Ambulatory Visit | Admitting: Surgery

## 2016-07-06 DIAGNOSIS — R1084 Generalized abdominal pain: Secondary | ICD-10-CM | POA: Insufficient documentation

## 2016-07-06 MED ORDER — IOPAMIDOL (ISOVUE-300) INJECTION 61%
100.0000 mL | Freq: Once | INTRAVENOUS | Status: AC | PRN
  Administered 2016-07-06: 100 mL via INTRAVENOUS

## 2016-07-09 ENCOUNTER — Ambulatory Visit: Payer: Medicaid Other | Admitting: Surgery

## 2016-07-09 ENCOUNTER — Telehealth: Payer: Self-pay

## 2016-07-09 NOTE — Telephone Encounter (Signed)
Spoke with patient at this time. She was a no show to her appointment today to discuss CT results. I called to reschedule and she asked what the CT scan showed. IMPRESSION: Normal appendix. No acute or inflammatory process identified in the abdomen or pelvis. Patient stated at this time that she did not see the need to reschedule due to normal CT scan. Patient was told that if she has any problems feel free to call the office.

## 2017-01-17 ENCOUNTER — Ambulatory Visit: Payer: Medicaid Other | Admitting: Ophthalmology

## 2017-03-01 ENCOUNTER — Encounter: Payer: Self-pay | Admitting: Emergency Medicine

## 2017-03-01 ENCOUNTER — Emergency Department
Admission: EM | Admit: 2017-03-01 | Discharge: 2017-03-01 | Disposition: A | Discharge status: Home / Self Care | Payer: Medicaid Other | Attending: Emergency Medicine | Admitting: Emergency Medicine

## 2017-03-01 DIAGNOSIS — L02415 Cutaneous abscess of right lower limb: Secondary | ICD-10-CM | POA: Diagnosis present

## 2017-03-01 DIAGNOSIS — L0291 Cutaneous abscess, unspecified: Secondary | ICD-10-CM

## 2017-03-01 DIAGNOSIS — F17210 Nicotine dependence, cigarettes, uncomplicated: Secondary | ICD-10-CM | POA: Diagnosis not present

## 2017-03-01 MED ORDER — CLINDAMYCIN HCL 150 MG PO CAPS: 150.0000 mg | ORAL_CAPSULE | Freq: Four times a day (QID) | ORAL | 0 refills | Status: DC

## 2017-03-01 MED ORDER — OXYCODONE-ACETAMINOPHEN 5-325 MG PO TABS
1.0000 | ORAL_TABLET | Freq: Once | ORAL | Status: AC
Start: 2017-03-01 — End: 2017-03-01
  Administered 2017-03-01: 1 via ORAL
  Filled 2017-03-01: qty 1

## 2017-03-01 MED ORDER — OXYCODONE-ACETAMINOPHEN 5-325 MG PO TABS: 1.0000 | ORAL_TABLET | Freq: Four times a day (QID) | ORAL | 0 refills | Status: DC | PRN

## 2017-03-01 MED ORDER — CLINDAMYCIN HCL 150 MG PO CAPS
150.0000 mg | ORAL_CAPSULE | Freq: Once | ORAL | Status: AC
  Administered 2017-03-01: 150 mg via ORAL
  Filled 2017-03-01: qty 1

## 2017-03-01 NOTE — ED Triage Notes (Signed)
C/O right thigh abcess.  Onset of symptoms Monday.  Patient states "I just need a prescription"

## 2017-03-01 NOTE — ED Provider Notes (Signed)
[facility] Emergency Department Provider Note   ____________________________________________   First MD Initiated Contact with Patient 03/01/17 [phone]     (approximate)  I have reviewed the triage vital signs and the nursing notes.   HISTORY  Chief Complaint Abscess    HPI Samantha Orr is a 31 y.o. female patient complaining of pain, edema, and swollen medial right thigh fold 2 days. Patient denies any drainage from the lesion. Patient denies any fever associated this complaint. Patient has a history of abscess to this area secondary to shaving. No palliative measures for this complaint.Patient rates the pain as 10 over 10. Patient described a pain as "achy".   Past Medical History:  Diagnosis Date  . Abscess   . Complication of anesthesia    hr/bp dropped after c/s 2015 in pacu   had spinal  . GERD (gastroesophageal reflux disease)    sometimes throws up when gas gets in her chest  . Shortness of breath dyspnea     Patient Active Problem List   Diagnosis Date Noted  . Hidradenitis   . Status post laparoscopic cholecystectomy 11/25/2014  . Calculus of gallbladder with chronic cholecystitis without obstruction 09/28/2014    Past Surgical History:  Procedure Laterality Date  . ABCESS DRAINAGE     BUTTOCK  . ABDOMINAL SURGERY     tubal, hernia and gallbladder  . CESAREAN SECTION    . CESAREAN SECTION  2012 and 2015  . CHOLECYSTECTOMY    . COLPOSCOPY  2015  . DILATION AND CURETTAGE OF UTERUS    . GALLBLADDER SURGERY  2016  . HERNIA REPAIR    . HYDRADENITIS EXCISION Bilateral 06/05/2016   Procedure: , right axilla;  Surgeon: Leafy Roiego F Pabon, MD;  Location: [facility] ORS;  Service: General;  Laterality: Bilateral;  . INGUINAL HERNIA REPAIR  2016  . TUBAL LIGATION    . TUBAL LIGATION    . TUBAL LIGATION  2016    Prior to Admission medications   Medication Sig Start Date End Date Taking? Authorizing Provider  clindamycin (CLEOCIN) 150 MG  capsule Take 1 capsule (150 mg total) by mouth 4 (four) times daily. 03/01/17   Joni ReiningSmith, Morty Ortwein K, PA-C  clindamycin (CLEOCIN) 300 MG capsule Take 1 capsule (300 mg total) by mouth 4 (four) times daily. 11/12/15   Cuthriell, Delorise RoyalsJonathan D, PA-C  oxyCODONE-acetaminophen (PERCOCET) 7.5-325 MG tablet Take 1 tablet by mouth every 4 (four) hours as needed for severe pain. 06/05/16   Pabon, Diego F, MD  oxyCODONE-acetaminophen (ROXICET) 5-325 MG tablet Take 1 tablet by mouth every 6 (six) hours as needed for moderate pain. 03/01/17   Joni ReiningSmith, Remigio Mcmillon K, PA-C    Allergies Bactrim [sulfamethoxazole-trimethoprim]  Family History  Problem Relation Age of Onset  . Hypertension Mother   . Breast cancer Maternal Aunt   . Uterine cancer Maternal Grandmother   . Diabetes Maternal Grandfather     Social History Social History  Substance Use Topics  . Smoking status: Current Every Day Smoker    Packs/day: 0.50    Types: Cigarettes  . Smokeless tobacco: Never Used  . Alcohol use 3.6 oz/week    6 Cans of beer per week    Review of Systems  Constitutional: No fever/chills Eyes: No visual changes. ENT: No sore throat. Cardiovascular: Denies chest pain. Respiratory: Denies shortness of breath. Gastrointestinal: No abdominal pain.  No nausea, no vomiting.  No diarrhea.  No constipation. Genitourinary: Negative for dysuria. Musculoskeletal: Negative for back pain. Skin:Nodule  lesion right thigh . Neurological: Negative for headaches, focal weakness or numbness. Allergic/Immunilogical: Bactrim ____________________________________________   PHYSICAL EXAM:  VITAL SIGNS: ED Triage Vitals  Enc Vitals Group     BP 03/01/17 0837 115/75     Pulse Rate 03/01/17 0837 97     Resp 03/01/17 0837 16     Temp 03/01/17 0837 98 F (36.7 C)     Temp Source 03/01/17 0837 Oral     SpO2 03/01/17 0837 97 %     Weight 03/01/17 0834 230 lb (104.3 kg)     Height 03/01/17 0834 5\' 2"  (1.575 m)     Head Circumference --        Peak Flow --      Pain Score 03/01/17 0834 10     Pain Loc --      Pain Edu? --      Excl. in GC? --     Constitutional: Alert and oriented. Well appearing and in no acute distress.  Cardiovascular: Normal rate, regular rhythm. Grossly normal heart sounds.  Good peripheral circulation. Respiratory: Normal respiratory effort.  No retractions. Lungs CTAB. Musculoskeletal: No lower extremity tenderness nor edema.  No joint effusions. Neurologic:  Normal speech and language. No gross focal neurologic deficits are appreciated. No gait instability. Skin:  Skin is warm, dry and intact. Nausea lesion on erythematous base right medial thigh for area. Lesions nonfluctuant.  Psychiatric: Mood and affect are normal. Speech and behavior are normal.  ____________________________________________   LABS (all labs ordered are listed, but only abnormal results are displayed)  Labs Reviewed - No data to display ____________________________________________  EKG   ____________________________________________  RADIOLOGY   ____________________________________________   PROCEDURES  Procedure(s) performed: None  Procedures  Critical Care performed: No  ____________________________________________   INITIAL IMPRESSION / ASSESSMENT AND PLAN / ED COURSE  Pertinent labs & imaging results that were available during my care of the patient were reviewed by me and considered in my medical decision making (see chart for details).  Nonfluctuant abscess right medial thigh fold. Discussed rationale for conservative treatment this time. Patient given discharge care instructions. Patient advised return by ER for condition worsens.      ____________________________________________   FINAL CLINICAL IMPRESSION(S) / ED DIAGNOSES  Final diagnoses:  Abscess      NEW MEDICATIONS STARTED DURING THIS VISIT:  New Prescriptions   CLINDAMYCIN (CLEOCIN) 150 MG CAPSULE    Take 1 capsule (150 mg  total) by mouth 4 (four) times daily.   OXYCODONE-ACETAMINOPHEN (ROXICET) 5-325 MG TABLET    Take 1 tablet by mouth every 6 (six) hours as needed for moderate pain.     Note:  This document was prepared using Dragon voice recognition software and may include unintentional dictation errors.    Joni Reining, PA-C 03/01/17 1191    Governor Rooks, MD 03/01/17 1300

## 2017-08-21 ENCOUNTER — Ambulatory Visit: Payer: Self-pay | Admitting: Ophthalmology

## 2018-01-01 ENCOUNTER — Other Ambulatory Visit: Payer: Self-pay

## 2018-01-01 ENCOUNTER — Emergency Department
Admission: EM | Admit: 2018-01-01 | Discharge: 2018-01-01 | Disposition: A | Discharge status: Home / Self Care | Payer: Medicaid Other | Attending: Emergency Medicine | Admitting: Emergency Medicine

## 2018-01-01 DIAGNOSIS — R222 Localized swelling, mass and lump, trunk: Secondary | ICD-10-CM | POA: Diagnosis present

## 2018-01-01 DIAGNOSIS — F17210 Nicotine dependence, cigarettes, uncomplicated: Secondary | ICD-10-CM | POA: Diagnosis not present

## 2018-01-01 DIAGNOSIS — L02214 Cutaneous abscess of groin: Secondary | ICD-10-CM | POA: Insufficient documentation

## 2018-01-01 DIAGNOSIS — L0291 Cutaneous abscess, unspecified: Secondary | ICD-10-CM

## 2018-01-01 MED ORDER — OXYCODONE-ACETAMINOPHEN 5-325 MG PO TABS: 1.0000 | ORAL_TABLET | ORAL | 0 refills | Status: DC | PRN

## 2018-01-01 MED ORDER — LIDOCAINE HCL (PF) 1 % IJ SOLN
5.0000 mL | Freq: Once | INTRAMUSCULAR | Status: AC
  Administered 2018-01-01: 5 mL via INTRADERMAL
  Filled 2018-01-01: qty 5

## 2018-01-01 MED ORDER — LIDOCAINE-EPINEPHRINE-TETRACAINE (LET) SOLUTION
3.0000 mL | Freq: Once | NASAL | Status: AC
  Administered 2018-01-01: 3 mL via TOPICAL
  Filled 2018-01-01: qty 3

## 2018-01-01 MED ORDER — CLINDAMYCIN HCL 150 MG PO CAPS: 300.0000 mg | ORAL_CAPSULE | Freq: Three times a day (TID) | ORAL | 0 refills | Status: DC

## 2018-01-01 NOTE — Discharge Instructions (Signed)
Follow-up with your regular doctor if you are not better in 3 days.  Return emergency department if worsening.  Take medication as prescribed.  Continue to apply warm compress to the area

## 2018-01-01 NOTE — ED Notes (Signed)
See triage note    States she developed a possible abscess area to right side of abd  States abscess developed about 3 days ago  No fever  States she has been using warm compresses to area

## 2018-01-01 NOTE — ED Provider Notes (Signed)
[facility] Emergency Department Provider Note  ____________________________________________   First MD Initiated Contact with Patient 01/01/18 1723     (approximate)  I have reviewed the triage vital signs and the nursing notes.   HISTORY  Chief Complaint Abscess    HPI Samantha Orr is a 32 y.o. female who presents emergency department complaining of an abscess to the right side of the pubic area.  She states she gets these often.  She had some leftover clindamycin at home and started that 2 days ago.  She states it is extremely painful to walk.  She states the swelling got worse and she now has a whitehead in the area.  She denies any fever or chills.  Past Medical History:  Diagnosis Date  . Abscess   . Complication of anesthesia    hr/bp dropped after c/s 2015 in pacu   had spinal  . GERD (gastroesophageal reflux disease)    sometimes throws up when gas gets in her chest  . Shortness of breath dyspnea     Patient Active Problem List   Diagnosis Date Noted  . Hidradenitis   . Status post laparoscopic cholecystectomy 11/25/2014  . Calculus of gallbladder with chronic cholecystitis without obstruction 09/28/2014    Past Surgical History:  Procedure Laterality Date  . ABCESS DRAINAGE     BUTTOCK  . ABDOMINAL SURGERY     tubal, hernia and gallbladder  . CESAREAN SECTION    . CESAREAN SECTION  2012 and 2015  . CHOLECYSTECTOMY    . COLPOSCOPY  2015  . DILATION AND CURETTAGE OF UTERUS    . GALLBLADDER SURGERY  2016  . HERNIA REPAIR    . HYDRADENITIS EXCISION Bilateral 06/05/2016   Procedure: , right axilla;  Surgeon: Leafy Ro, MD;  Location: [facility] ORS;  Service: General;  Laterality: Bilateral;  . INGUINAL HERNIA REPAIR  2016  . TUBAL LIGATION    . TUBAL LIGATION    . TUBAL LIGATION  2016    Prior to Admission medications   Medication Sig Start Date End Date Taking? Authorizing Provider  clindamycin (CLEOCIN) 150 MG capsule Take  2 capsules (300 mg total) by mouth 3 (three) times daily. 01/01/18   Fisher, Roselyn Bering, PA-C  oxyCODONE-acetaminophen (PERCOCET/ROXICET) 5-325 MG tablet Take 1 tablet by mouth every 4 (four) hours as needed for severe pain. 01/01/18   Faythe Ghee, PA-C    Allergies Bactrim [sulfamethoxazole-trimethoprim]  Family History  Problem Relation Age of Onset  . Hypertension Mother   . Breast cancer Maternal Aunt   . Uterine cancer Maternal Grandmother   . Diabetes Maternal Grandfather     Social History Social History   Tobacco Use  . Smoking status: Current Every Day Smoker    Packs/day: 0.50    Types: Cigarettes  . Smokeless tobacco: Never Used  Substance Use Topics  . Alcohol use: Yes    Alcohol/week: 3.6 oz    Types: 6 Cans of beer per week  . Drug use: No    Review of Systems  Constitutional: No fever/chills Eyes: No visual changes. ENT: No sore throat. Respiratory: Denies cough Genitourinary: Negative for dysuria. Musculoskeletal: Negative for back pain. Skin: Negative for rash.  Positive for redness and swelling at the pubis    ____________________________________________   PHYSICAL EXAM:  VITAL SIGNS: ED Triage Vitals [01/01/18 1657]  Enc Vitals Group     BP (!) 176/76     Pulse Rate (!) 106  Resp 18     Temp 99.2 F (37.3 C)     Temp Source Oral     SpO2 100 %     Weight 230 lb (104.3 kg)     Height 5\' 2"  (1.575 m)     Head Circumference      Peak Flow      Pain Score 10     Pain Loc      Pain Edu?      Excl. in GC?     Constitutional: Alert and oriented. Well appearing and in no acute distress. Eyes: Conjunctivae are normal.  Head: Atraumatic. Nose: No congestion/rhinnorhea. Mouth/Throat: Mucous membranes are moist.   Cardiovascular: Normal rate, regular rhythm.  Heart sounds are normal Respiratory: Normal respiratory effort.  No retractions, lungs clear to auscultation GU: deferred Musculoskeletal: FROM all extremities, warm and well  perfused Neurologic:  Normal speech and language.  Skin:  Skin is warm, dry and intact.  There is a silver dollar sized red swollen area along the right side of the lower pubis, the area is fluctuant and tender Psychiatric: Mood and affect are normal. Speech and behavior are normal.  ____________________________________________   LABS (all labs ordered are listed, but only abnormal results are displayed)  Labs Reviewed - No data to display ____________________________________________   ____________________________________________  RADIOLOGY    ____________________________________________   PROCEDURES  Procedure(s) performed:  Marland KitchenMarland KitchenIncision and Drainage Date/Time: 01/01/2018 11:22 PM Performed by: Faythe Ghee, PA-C Authorized by: Faythe Ghee, PA-C   Consent:    Consent obtained:  Verbal   Consent given by:  Patient   Risks discussed:  Incomplete drainage, infection, pain and bleeding Location:    Type:  Abscess   Location:  Trunk   Trunk location:  Abdomen Pre-procedure details:    Skin preparation:  Betadine Anesthesia (see MAR for exact dosages):    Anesthesia method:  Topical application and local infiltration   Topical anesthetic:  LET   Local anesthetic:  Lidocaine 1% w/o epi Procedure type:    Complexity:  Simple Procedure details:    Incision types:  Stab incision   Incision depth:  Submucosal   Scalpel blade:  11   Wound management:  Probed and deloculated and irrigated with saline   Drainage:  Purulent and bloody   Drainage amount:  Moderate   Wound treatment:  Wound left open   Packing materials:  None Post-procedure details:    Patient tolerance of procedure:  Tolerated well, no immediate complications Comments:     Pt refused packing materials, states she can't take the pressure      ____________________________________________   INITIAL IMPRESSION / ASSESSMENT AND PLAN / ED COURSE  Pertinent labs & imaging results that were  available during my care of the patient were reviewed by me and considered in my medical decision making (see chart for details).  Patient is a 32 year old female present emergency department with an abscess on the pubis  On physical exam the area is red and swollen and tender.  LET was applied, 1% Xylocaine for local.  Incision with a 11 blade.  A moderate amount of pus was expelled.  The patient is refusing packing material as "she just cannot take the pressure "explained to her that this may close back up and caused her to have to have the area reopened.  She states it is okay with her.  Patient was given a prescription for clindamycin and Percocet.  She is to follow-up with her  regular doctor if not better in 3-5 days.  She is to return to emergency department if worsening.  She states she understands will comply with our instructions.  She was discharged in stable condition     As part of my medical decision making, I reviewed the following data within the electronic MEDICAL RECORD NUMBER Nursing notes reviewed and incorporated, Old chart reviewed, Notes from prior ED visits and [place] Controlled Substance Database  ____________________________________________   FINAL CLINICAL IMPRESSION(S) / ED DIAGNOSES  Final diagnoses:  Abscess      NEW MEDICATIONS STARTED DURING THIS VISIT:  Discharge Medication List as of 01/01/2018  6:23 PM       Note:  This document was prepared using Dragon voice recognition software and may include unintentional dictation errors.    Faythe GheeFisher, Susan W, PA-C 01/01/18 2326    Phineas SemenGoodman, [place], MD 01/01/18 2329

## 2018-01-01 NOTE — ED Triage Notes (Addendum)
Abscess to right groin, swelling and has white head. Hx of similar in past. Pt has extreme pain with walking.

## 2018-02-17 ENCOUNTER — Encounter: Payer: Self-pay | Admitting: Emergency Medicine

## 2018-02-17 ENCOUNTER — Emergency Department: Payer: Medicaid Other

## 2018-02-17 ENCOUNTER — Emergency Department
Admission: EM | Admit: 2018-02-17 | Discharge: 2018-02-17 | Disposition: A | Discharge status: Home / Self Care | Payer: Medicaid Other | Attending: Emergency Medicine | Admitting: Emergency Medicine

## 2018-02-17 DIAGNOSIS — Z79899 Other long term (current) drug therapy: Secondary | ICD-10-CM | POA: Insufficient documentation

## 2018-02-17 DIAGNOSIS — F17210 Nicotine dependence, cigarettes, uncomplicated: Secondary | ICD-10-CM | POA: Insufficient documentation

## 2018-02-17 DIAGNOSIS — I872 Venous insufficiency (chronic) (peripheral): Secondary | ICD-10-CM | POA: Insufficient documentation

## 2018-02-17 DIAGNOSIS — R600 Localized edema: Secondary | ICD-10-CM | POA: Diagnosis present

## 2018-02-17 LAB — COMPREHENSIVE METABOLIC PANEL
ALT: 14 U/L (ref 14–54)
ANION GAP: 3 — AB (ref 5–15)
AST: 18 U/L (ref 15–41)
Albumin: 3.3 g/dL — ABNORMAL LOW (ref 3.5–5.0)
Alkaline Phosphatase: 67 U/L (ref 38–126)
BUN: 8 mg/dL (ref 6–20)
CHLORIDE: 109 mmol/L (ref 101–111)
CO2: 24 mmol/L (ref 22–32)
Calcium: 8 mg/dL — ABNORMAL LOW (ref 8.9–10.3)
Creatinine, Ser: 0.72 mg/dL (ref 0.44–1.00)
GFR calc Af Amer: >60 mL/min (ref >60)
GFR calc non Af Amer: >60 mL/min (ref >60)
Glucose, Bld: 92 mg/dL (ref 65–99)
POTASSIUM: 4.3 mmol/L (ref 3.5–5.1)
SODIUM: 136 mmol/L (ref 135–145)
Total Bilirubin: 0.3 mg/dL (ref 0.3–1.2)
Total Protein: 6.3 g/dL — ABNORMAL LOW (ref 6.5–8.1)

## 2018-02-17 LAB — CBC WITH DIFFERENTIAL/PLATELET
BASOS ABS: 0.1 10*3/uL (ref 0–0.1)
BASOS PCT: 1 %
EOS ABS: 0.2 10*3/uL (ref 0–0.7)
Eosinophils Relative: 2 %
HCT: 40.4 % (ref 35.0–47.0)
Hemoglobin: 13.3 g/dL (ref 12.0–16.0)
Lymphocytes Relative: 14 %
Lymphs Abs: 1.3 10*3/uL (ref 1.0–3.6)
MCH: 28.9 pg (ref 26.0–34.0)
MCHC: 33 g/dL (ref 32.0–36.0)
MCV: 87.7 fL (ref 80.0–100.0)
MONO ABS: 0.6 10*3/uL (ref 0.2–0.9)
Monocytes Relative: 6 %
NEUTROS ABS: 7.1 10*3/uL — AB (ref 1.4–6.5)
Neutrophils Relative %: 77 %
PLATELETS: 204 10*3/uL (ref 150–440)
RBC: 4.61 MIL/uL (ref 3.80–5.20)
RDW: 14.5 % (ref 11.5–14.5)
WBC: 9.3 10*3/uL (ref 3.6–11.0)

## 2018-02-17 LAB — BRAIN NATRIURETIC PEPTIDE: B NATRIURETIC PEPTIDE 5: 39 pg/mL (ref 0.0–100.0)

## 2018-02-17 MED ORDER — FUROSEMIDE 20 MG PO TABS: 20.0000 mg | ORAL_TABLET | Freq: Every day | ORAL | 0 refills | Status: DC | PRN

## 2018-02-17 NOTE — ED Triage Notes (Signed)
Pt arrived with complaints of swollen/painful feet. Pulse palpable bilaterally. Pt states this is the third time they have become swollen since November. Pt denies shortness of breath or chest pain.

## 2018-02-17 NOTE — ED Provider Notes (Signed)
[facility] Emergency Department Provider Note  ____________________________________________  Time seen: Approximately 4:40 PM  I have reviewed the triage vital signs and the nursing notes.   HISTORY  Chief Complaint Foot Swelling   HPI Samantha Orr is a 32 y.o. female who presents for evaluation of bilateral ankle and feet swelling.  Patient reports this is a third time she is noticed swelling in her feet since November 2018.  The other 2 times symptoms resolved after patient spent 2 to 3 days at home with her feet elevated.  She reports that her feet and ankle have become progressively swollen over the course of the last week.  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  The swelling is worse in the end of the day and resolves in the morning.  She reports that she feels like her legs are very tight but denies any pain.  No personal history of blood clots but her mother had a DVT in the setting of uterine cancer.  She denies chest pain or shortness of breath, unintentional weight loss or weight gain.  No redness of her leg.  No pain with movement of her joints.  Past Medical History:  Diagnosis Date  . Abscess   . Complication of anesthesia    hr/bp dropped after c/s 2015 in pacu   had spinal  . GERD (gastroesophageal reflux disease)    sometimes throws up when gas gets in her chest  . Shortness of breath dyspnea     Patient Active Problem List   Diagnosis Date Noted  . Hidradenitis   . Status post laparoscopic cholecystectomy 11/25/2014  . Calculus of gallbladder with chronic cholecystitis without obstruction 09/28/2014    Past Surgical History:  Procedure Laterality Date  . ABCESS DRAINAGE     BUTTOCK  . ABDOMINAL SURGERY     tubal, hernia and gallbladder  . CESAREAN SECTION    . CESAREAN SECTION  2012 and 2015  . CHOLECYSTECTOMY    . COLPOSCOPY  2015  . DILATION AND CURETTAGE OF UTERUS    .  GALLBLADDER SURGERY  2016  . HERNIA REPAIR    . HYDRADENITIS EXCISION Bilateral 06/05/2016   Procedure: , right axilla;  Surgeon: Leafy Ro, MD;  Location: [facility] ORS;  Service: General;  Laterality: Bilateral;  . INGUINAL HERNIA REPAIR  2016  . TUBAL LIGATION    . TUBAL LIGATION    . TUBAL LIGATION  2016    Prior to Admission medications   Medication Sig Start Date End Date Taking? Authorizing Provider  clindamycin (CLEOCIN) 150 MG capsule Take 2 capsules (300 mg total) by mouth 3 (three) times daily. 01/01/18   Fisher, Roselyn Bering, PA-C  furosemide (LASIX) 20 MG tablet Take 1 tablet (20 mg total) by mouth daily as needed for edema. 02/17/18 02/17/19  Nita Sickle, MD  oxyCODONE-acetaminophen (PERCOCET/ROXICET) 5-325 MG tablet Take 1 tablet by mouth every 4 (four) hours as needed for severe pain. 01/01/18   Faythe Ghee, PA-C    Allergies Bactrim [sulfamethoxazole-trimethoprim]  Family History  Problem Relation Age of Onset  . Hypertension Mother   . Breast cancer Maternal Aunt   . Uterine cancer Maternal Grandmother   . Diabetes Maternal Grandfather     Social History Social History   Tobacco Use  . Smoking status: Current Every Day Smoker    Packs/day: 0.50    Types: Cigarettes  . Smokeless tobacco:  Never Used  Substance Use Topics  . Alcohol use: Yes    Alcohol/week: 3.6 oz    Types: 6 Cans of beer per week  . Drug use: No    Review of Systems  Constitutional: Negative for fever. Eyes: Negative for visual changes. ENT: Negative for sore throat. Neck: No neck pain  Cardiovascular: Negative for chest pain. Respiratory: Negative for shortness of breath. Gastrointestinal: Negative for abdominal pain, vomiting or diarrhea. Genitourinary: Negative for dysuria. Musculoskeletal: Negative for back pain. + b/l ankle and feet swelling Skin: Negative for rash. Neurological: Negative for headaches, weakness or numbness. Psych: No SI or  HI  ____________________________________________   PHYSICAL EXAM:  VITAL SIGNS: ED Triage Vitals  Enc Vitals Group     BP 02/17/18 1345 125/66     Pulse Rate 02/17/18 1345 75     Resp 02/17/18 1345 18     Temp 02/17/18 1345 98.7 F (37.1 C)     Temp Source 02/17/18 1345 Oral     SpO2 02/17/18 1345 100 %     Weight 02/17/18 1346 226 lb (102.5 kg)     Height 02/17/18 1346  (1.575 m)     Head Circumference --      Peak Flow --      Pain Score 02/17/18 1345 6     Pain Loc --      Pain Edu? --      Excl. in GC? --     Constitutional: Alert and oriented. Well appearing and in no apparent distress. HEENT:      Head: Normocephalic and atraumatic.         Eyes: Conjunctivae are normal. Sclera is non-icteric.       Mouth/Throat: Mucous membranes are moist.       Neck: Supple with no signs of meningismus. Cardiovascular: Regular rate and rhythm. No murmurs, gallops, or rubs. 2+ symmetrical distal pulses are present in all extremities. No JVD. Respiratory: Normal respiratory effort. Lungs are clear to auscultation bilaterally. No wheezes, crackles, or rhonchi.  Gastrointestinal: Soft, non tender, and non distended with positive bowel sounds. No rebound or guarding. Musculoskeletal: Mild nonpitting edema of the dorsum of the feet and ankle bilaterally, strong pulses, full painless range of motion of ankle joint, no erythema or warmth, no abscess, no rash.  Extremities are warm and well-perfused Neurologic: Normal speech and language. Face is symmetric. Moving all extremities. No gross focal neurologic deficits are appreciated. Skin: Skin is warm, dry and intact. No rash noted. Psychiatric: Mood and affect are normal. Speech and behavior are normal.  ____________________________________________   LABS (all labs ordered are listed, but only abnormal results are displayed)  Labs Reviewed  CBC WITH DIFFERENTIAL/PLATELET - Abnormal; Notable for the following components:      Result  Value   Neutro Abs 7.1 (*)    All other components within normal limits  COMPREHENSIVE METABOLIC PANEL - Abnormal; Notable for the following components:   Calcium 8.0 (*)    Total Protein 6.3 (*)    Albumin 3.3 (*)    Anion gap 3 (*)    All other components within normal limits  BRAIN NATRIURETIC PEPTIDE   ____________________________________________  EKG  none  ____________________________________________  RADIOLOGY  I have personally reviewed the images performed during this visit and I agree with the Radiologist's read.   Interpretation by Radiologist:  US Venous Img Lower Bilateral  Result Date: 02/17/2018 CLINICAL DATA:  32 year old female with a history of bilateral lower extremity  swelling EXAM: BILATERAL LOWER EXTREMITY VENOUS DOPPLER ULTRASOUND TECHNIQUE: Gray-scale sonography with graded compression, as well as color Doppler and duplex ultrasound were performed to evaluate the lower extremity deep venous systems from the level of the common femoral vein and including the common femoral, femoral, profunda femoral, popliteal and calf veins including the posterior tibial, peroneal and gastrocnemius veins when visible. The superficial great saphenous vein was also interrogated. Spectral Doppler was utilized to evaluate flow at rest and with distal augmentation maneuvers in the common femoral, femoral and popliteal veins. COMPARISON:  None. FINDINGS: RIGHT LOWER EXTREMITY Common Femoral Vein: No evidence of thrombus. Normal compressibility, respiratory phasicity and response to augmentation. Saphenofemoral Junction: No evidence of thrombus. Normal compressibility and flow on color Doppler imaging. Profunda Femoral Vein: No evidence of thrombus. Normal compressibility and flow on color Doppler imaging. Femoral Vein: No evidence of thrombus. Normal compressibility, respiratory phasicity and response to augmentation. Popliteal Vein: No evidence of thrombus. Normal compressibility,  respiratory phasicity and response to augmentation. Calf Veins: No evidence of thrombus. Normal compressibility and flow on color Doppler imaging. Superficial Great Saphenous Vein: No evidence of thrombus. Normal compressibility and flow on color Doppler imaging. Other Findings:  None. LEFT LOWER EXTREMITY Common Femoral Vein: No evidence of thrombus. Normal compressibility, respiratory phasicity and response to augmentation. Saphenofemoral Junction: No evidence of thrombus. Normal compressibility and flow on color Doppler imaging. Profunda Femoral Vein: No evidence of thrombus. Normal compressibility and flow on color Doppler imaging. Femoral Vein: No evidence of thrombus. Normal compressibility, respiratory phasicity and response to augmentation. Popliteal Vein: No evidence of thrombus. Normal compressibility, respiratory phasicity and response to augmentation. Calf Veins: No evidence of thrombus. Normal compressibility and flow on color Doppler imaging. Superficial Great Saphenous Vein: No evidence of thrombus. Normal compressibility and flow on color Doppler imaging. Other Findings: Small anechoic fluid at the posteromedial knee, measuring as large as 1.9 cm. IMPRESSION: Sonographic survey of the bilateral lower extremities negative for DVT. Small anechoic fluid collection at the posteromedial left knee may reflect a small Baker's cyst or parameniscal cyst. Electronically Signed   By: Gilmer Mor D.O.   On: 02/17/2018 16:38     ____________________________________________   PROCEDURES  Procedure(s) performed: None Procedures Critical Care performed:  None ____________________________________________   INITIAL IMPRESSION / ASSESSMENT AND PLAN / ED COURSE  32 y.o. female who presents for evaluation of bilateral ankle and feet swelling.  Patient has a small amount of nonpitting edema of the dorsum of bilateral feet and bilateral ankle.  No signs of cellulitis, ischemia, septic joint.  Doppler  studies were done to rule out DVT they are negative.  Labs showing normal kidney function, normal BNP, lungs CTAB and patient is euvolemic on exam. Presentation concerning for venous insufficiency.  Will provide patient with a prescription for Lasix as needed.  Recommended taking breaks and elevating her legs.  Recommend follow-up with primary care doctor.  Discussed return precautions with patient.      As part of my medical decision making, I reviewed the following data within the electronic MEDICAL RECORD NUMBER Nursing notes reviewed and incorporated, Labs reviewed , Old chart reviewed, Radiograph reviewed , Notes from prior ED visits and [place] Controlled Substance Database    Pertinent labs & imaging results that were available during my care of the patient were reviewed by me and considered in my medical decision making (see chart for details).    ____________________________________________   FINAL CLINICAL IMPRESSION(S) / ED DIAGNOSES  Final diagnoses:  Venous insufficiency of both lower extremities      NEW MEDICATIONS STARTED DURING THIS VISIT:  ED Discharge Orders        Ordered    furosemide (LASIX) 20 MG tablet  Daily PRN     02/17/18 1645       Note:  This document was prepared using Dragon voice recognition software and may include unintentional dictation errors.    Don Perking, [place], MD 02/17/18 [phone]

## 2018-04-03 ENCOUNTER — Other Ambulatory Visit: Payer: Self-pay

## 2018-04-03 ENCOUNTER — Emergency Department
Admission: EM | Admit: 2018-04-03 | Discharge: 2018-04-03 | Disposition: A | Discharge status: Home / Self Care | Payer: Medicaid Other | Attending: Emergency Medicine | Admitting: Emergency Medicine

## 2018-04-03 DIAGNOSIS — Z79899 Other long term (current) drug therapy: Secondary | ICD-10-CM | POA: Diagnosis not present

## 2018-04-03 DIAGNOSIS — J020 Streptococcal pharyngitis: Secondary | ICD-10-CM | POA: Diagnosis not present

## 2018-04-03 DIAGNOSIS — F17210 Nicotine dependence, cigarettes, uncomplicated: Secondary | ICD-10-CM | POA: Diagnosis not present

## 2018-04-03 DIAGNOSIS — R070 Pain in throat: Secondary | ICD-10-CM | POA: Diagnosis present

## 2018-04-03 LAB — GROUP A STREP BY PCR: Group A Strep by PCR: DETECTED — AB

## 2018-04-03 MED ORDER — PENICILLIN V POTASSIUM 500 MG PO TABS
500.0000 mg | ORAL_TABLET | Freq: Once | ORAL | Status: AC
  Administered 2018-04-03: 500 mg via ORAL
  Filled 2018-04-03: qty 1

## 2018-04-03 MED ORDER — LIDOCAINE VISCOUS HCL 2 % MT SOLN
15.0000 mL | Freq: Once | OROMUCOSAL | Status: AC
  Administered 2018-04-03: 15 mL via OROMUCOSAL
  Filled 2018-04-03: qty 15

## 2018-04-03 MED ORDER — DEXAMETHASONE SODIUM PHOSPHATE 10 MG/ML IJ SOLN
10.0000 mg | Freq: Once | INTRAMUSCULAR | Status: AC
  Administered 2018-04-03: 10 mg via INTRAMUSCULAR

## 2018-04-03 MED ORDER — DEXAMETHASONE SODIUM PHOSPHATE 10 MG/ML IJ SOLN
INTRAMUSCULAR | Status: AC
  Filled 2018-04-03: qty 1

## 2018-04-03 MED ORDER — PENICILLIN V POTASSIUM 500 MG PO TABS: 500.0000 mg | ORAL_TABLET | Freq: Three times a day (TID) | ORAL | 0 refills | Status: AC

## 2018-04-03 NOTE — ED Provider Notes (Signed)
[facility] Emergency Department Provider Note  ____________________________________________  Time seen: Approximately 7:53 PM  I have reviewed the triage vital signs and the nursing notes.   HISTORY  Chief Complaint Sore Throat    HPI Samantha Orr is a 32 y.o. female that presents to the emergency department for evaluation of sore throat for 1 day.  Patient states that pain is worse on the right side and throat feels swollen.  She states that temperature has fluctuated between 98 and 100.  No dental pain.  She has been drinking liquids and eating popcicles.  No cough, shortness of breath, nausea, vomiting, abdominal pain.   Past Medical History:  Diagnosis Date  . Abscess   . Complication of anesthesia    hr/bp dropped after c/s 2015 in pac[place]   had spinal  . GERD (gastroesophageal reflux disease)    sometimes throws up when gas gets in her chest  . Shortness of breath dyspnea     Patient Active Problem List   Diagnosis Date Noted  . Hidradenitis   . Status post laparoscopic cholecystectomy 11/25/2014  . Calculus of gallbladder with chronic cholecystitis without obstruction 09/28/2014    Past Surgical History:  Procedure Laterality Date  . ABCESS DRAINAGE     BUTTOCK  . ABDOMINAL SURGERY     tubal, hernia and gallbladder  . CESAREAN SECTION    . CESAREAN SECTION  2012 and 2015  . CHOLECYSTECTOMY    . COLPOSCOPY  2015  . DILATION AND CURETTAGE OF UTERUS    . GALLBLADDER SURGERY  2016  . HERNIA REPAIR    . HYDRADENITIS EXCISION Bilateral 06/05/2016   Procedure: , right axilla;  Surgeon: Leafy Roiego F Pabon, MD;  Location: [facility] ORS;  Service: General;  Laterality: Bilateral;  . INGUINAL HERNIA REPAIR  2016  . TUBAL LIGATION    . TUBAL LIGATION    . TUBAL LIGATION  2016    Prior to Admission medications   Medication Sig Start Date End Date Taking? Authorizing Provider  clindamycin (CLEOCIN) 150 MG capsule Take 2 capsules (300 mg total) by  mouth 3 (three) times daily. 01/01/18   Fisher, Roselyn BeringSusan W, PA-C  furosemide (LASIX) 20 MG tablet Take 1 tablet (20 mg total) by mouth daily as needed for edema. 02/17/18 02/17/19  Nita SickleVeronese, [place], MD  oxyCODONE-acetaminophen (PERCOCET/ROXICET) 5-325 MG tablet Take 1 tablet by mouth every 4 (four) hours as needed for severe pain. 01/01/18   Fisher, Roselyn BeringSusan W, PA-C  penicillin v potassium (VEETID) 500 MG tablet Take 1 tablet (500 mg total) by mouth 3 (three) times daily for 10 days. 04/03/18 04/13/18  Enid DerryWagner, Malvin Morrish, PA-C    Allergies Bactrim [sulfamethoxazole-trimethoprim]  Family History  Problem Relation Age of Onset  . Hypertension Mother   . Breast cancer Maternal Aunt   . Uterine cancer Maternal Grandmother   . Diabetes Maternal Grandfather     Social History Social History   Tobacco Use  . Smoking status: Current Every Day Smoker    Packs/day: 0.50    Types: Cigarettes  . Smokeless tobacco: Never Used  Substance Use Topics  . Alcohol use: Yes    Alcohol/week: 3.6 oz    Types: 6 Cans of beer per week  . Drug use: No     Review of Systems  Constitutional: Positive for fever. Eyes: No visual changes. No discharge. ENT: Negative for congestion and rhinorrhea.  Positive for sore throat. Cardiovascular: No chest pain. Respiratory: Negative for cough. No SOB. Gastrointestinal:  No abdominal pain.  No nausea, no vomiting.  No diarrhea.  No constipation. Musculoskeletal: Negative for musculoskeletal pain. Skin: Negative for rash, abrasions, lacerations, ecchymosis.   ____________________________________________   PHYSICAL EXAM:  VITAL SIGNS: ED Triage Vitals  Enc Vitals Group     BP 04/03/18 1909 116/77     Pulse Rate 04/03/18 1909 97     Resp 04/03/18 1909 18     Temp 04/03/18 1909 99.4 F (37.4 C)     Temp Source 04/03/18 1909 Oral     SpO2 04/03/18 1909 98 %     Weight 04/03/18 1910 214 lb (97.1 kg)     Height 04/03/18 1910 5\' 2"  (1.575 m)     Head Circumference --       Peak Flow --      Pain Score 04/03/18 1910 10     Pain Loc --      Pain Edu? --      Excl. in GC? --      Constitutional: Alert and oriented. Well appearing and in no acute distress. Eyes: Conjunctivae are normal. PERRL. EOMI. No discharge. Head: Atraumatic. ENT: No frontal and maxillary sinus tenderness.      Ears: Tympanic membranes pearly gray with good landmarks. No discharge.      Nose: No congestion/rhinnorhea.      Mouth/Throat: Mucous membranes are moist. Oropharynx erythematous. Tonsils not enlarged. Exudates bilaterally. Uvula midline. Neck: No stridor.   Hematological/Lymphatic/Immunilogical: Anterior cervical lymphadenopathy. Cardiovascular: Normal rate, regular rhythm.  Good peripheral circulation. Respiratory: Normal respiratory effort without tachypnea or retractions. Lungs CTAB. Good air entry to the bases with no decreased or absent breath sounds. Gastrointestinal: Bowel sounds 4 quadrants. Soft and nontender to palpation. No guarding or rigidity. No palpable masses. No distention. Musculoskeletal: Full range of motion to all extremities. No gross deformities appreciated. Neurologic:  Normal speech and language. No gross focal neurologic deficits are appreciated.  Skin:  Skin is warm, dry and intact. No rash noted. Psychiatric: Mood and affect are normal. Speech and behavior are normal. Patient exhibits appropriate insight and judgement.   ____________________________________________   LABS (all labs ordered are listed, but only abnormal results are displayed)  Labs Reviewed  GROUP A STREP BY PCR - Abnormal; Notable for the following components:      Result Value   Group A Strep by PCR DETECTED (*)    All other components within normal limits   ____________________________________________  EKG   ____________________________________________  RADIOLOGY   No results  found.  ____________________________________________    PROCEDURES  Procedure(s) performed:    Procedures    Medications  lidocaine (XYLOCAINE) 2 % viscous mouth solution 15 mL (15 mLs Mouth/Throat Given 04/03/18 2002)  penicillin v potassium (VEETID) tablet 500 mg (500 mg Oral Given 04/03/18 2002)  dexamethasone (DECADRON) injection 10 mg (10 mg Intramuscular Given 04/03/18 2002)     ____________________________________________   INITIAL IMPRESSION / ASSESSMENT AND PLAN / ED COURSE  Pertinent labs & imaging results that were available during my care of the patient were reviewed by me and considered in my medical decision making (see chart for details).  Review of the [place] CSRS was performed in accordance of the [facility] prior to dispensing any controlled drugs.     Patient's diagnosis is consistent with strep pharyngitis. Vital signs and exam are reassuring. Patient appears well and is staying well hydrated. Patient should alternate tylenol and ibuprofen for fever. Patient feels comfortable going home. Patient will be discharged home with  prescriptions for penicillin. Patient is to follow up with PCP as needed or otherwise directed. Patient is given ED precautions to return to the ED for any worsening or new symptoms.     ____________________________________________  FINAL CLINICAL IMPRESSION(S) / ED DIAGNOSES  Final diagnoses:  Strep pharyngitis      NEW MEDICATIONS STARTED DURING THIS VISIT:  ED Discharge Orders        Ordered    penicillin v potassium (VEETID) 500 MG tablet  3 times daily     04/03/18 2029          This chart was dictated using voice recognition software/Dragon. Despite best efforts to proofread, errors can occur which can change the meaning. Any change was purely unintentional.    Enid Derry, PA-C 04/03/18 2106    Dionne Bucy, MD 04/04/18 [phone]

## 2018-04-03 NOTE — ED Notes (Signed)
Pt is AOx4, VSS, she reports having a sore throat since yesterday, upon assessment pt's right side of her neck is swollen, tender and warm to touch. The inside of the mouth shows swelling on the right side. Pt does not show any signs of respiratory distress. Bilateral lungs sounds are clear. We will continue to monitor the pt.

## 2018-04-03 NOTE — ED Triage Notes (Signed)
Pt in with co sore throat for 2 days, states now right sided neck and face pain.

## 2018-10-06 ENCOUNTER — Ambulatory Visit: Payer: Medicaid Other | Admitting: Nurse Practitioner

## 2018-10-10 ENCOUNTER — Ambulatory Visit: Payer: Medicaid Other | Admitting: Nurse Practitioner

## 2018-10-10 ENCOUNTER — Encounter: Payer: Self-pay | Admitting: Nurse Practitioner

## 2018-10-10 VITALS — BP 103/60 | HR 71 | Temp 98.9°F | Ht 62.0 in | Wt 208.8 lb

## 2018-10-10 DIAGNOSIS — L732 Hidradenitis suppurativa: Secondary | ICD-10-CM

## 2018-10-10 DIAGNOSIS — R140 Abdominal distension (gaseous): Secondary | ICD-10-CM | POA: Diagnosis not present

## 2018-10-10 DIAGNOSIS — Z8742 Personal history of other diseases of the female genital tract: Secondary | ICD-10-CM

## 2018-10-10 NOTE — Assessment & Plan Note (Signed)
Chronic, stable at this time.  Referral to dermatology to establish care and provide recommendations.

## 2018-10-10 NOTE — Assessment & Plan Note (Signed)
Presents like IBS.  Patient refuses labs today, but states she will obtain at next visit.  Recommend trial of probiotic tablet twice a day.  Referral to GI for further evaluation, since has been ongoing since removal of gall bladder, and recommendations.

## 2018-10-10 NOTE — Progress Notes (Signed)
New Patient Office Visit  Subjective:  Patient ID: Samantha Orr, female    DOB: [birth year]  Age: 32 y.o. MRN: [id number]  CC:  Chief Complaint  Patient presents with  . Establish Care  . Gas    Patient states she had galbladder removal, tubal ligation, cholecystectomy and she's had stomach issues.  . Bloated  . Abdominal Cramping  . Referral    Patient requests referral to OBGYN  . Hidradenitis    HPI Samantha Cookeyasheena L Jackson presents for new patient visit to establish care.  Introduced to Publishing rights managernurse practitioner role and practice setting.  All questions answered.  Has not seen primary care provider in a couple years.    ABDOMINAL BLOATING/GAS/CRAMPING Ever since 2017 "when I had that dang surgery I have not been right since", gallbladder out in 2017.  She uses "hot ginger ale and lie on left side, which helps".  Only been to bathroom twice today, as has not ate today.  After she eats she has frequent stools with cramping and bloating, varies between constipation and loose stool.  H/O GERD, prior to gall bladder surgery and states no issues since that time. Duration:since 2017 Onset: gradual Severity: 10/10 with cramping when present Quality: cramping and pulling Location:  diffuse  Intermittent with cramping/bloating/loose stools Episode duration:  Radiation: no Frequency: intermittent Alleviating factors:  Aggravating factors: Status: fluctuating Treatments attempted: none Fever: no Nausea: no Vomiting: no Weight loss: no Decreased appetite: no Diarrhea: yes, fluctuates Constipation: yes, fluctuates Blood in stool: no Heartburn: no Jaundice: no Rash: no Dysuria/urinary frequency: no Hematuria: no History of sexually transmitted disease: no Recurrent NSAID use: no   HIDRADENITIS: Has been seen by dermatology in past, 2008 in [place].  Has been treated in past with abx and surgery.  Has been "pretty much been getting cut since age 32".  Uses home remedies, Epson bath and  "warm rags in microwave".  Continues to have flares under arms and in groin area, none at this time and she denies drainage.  Reports she does not do well with abx in pill form, so often does not take when prescribed.  Discussed possible liquid options, which she will discuss further with dermatology.  REFERRAL TO OB/GYN: Has h/o tubal ligation, D&C, and colposcopy x 2 (last was 2013).  Would like to set-up regular exam with GYN again because of "my history".  She reports she gets "abnormal paps a lot".  Had grandmother pass from uterine cancer.  Pt last pap was in 2017.    Functional Status Survey: Is the patient deaf or have difficulty hearing?: No Does the patient have difficulty seeing, even when wearing glasses/contacts?: No Does the patient have difficulty concentrating, remembering, or making decisions?: No Does the patient have difficulty walking or climbing stairs?: No Does the patient have difficulty dressing or bathing?: No Does the patient have difficulty doing errands alone such as visiting a doctor's office or shopping?: No  Past Medical History:  Diagnosis Date  . Abscess   . Complication of anesthesia    hr/bp dropped after c/s 2015 in [place]   had spinal  . GERD (gastroesophageal reflux disease)    sometimes throws up when gas gets in her chest  . Shortness of breath dyspnea     Past Surgical History:  Procedure Laterality Date  . ABCESS DRAINAGE     BUTTOCK  . ABDOMINAL SURGERY     tubal, hernia and gallbladder  . CESAREAN SECTION    . CESAREAN SECTION  2012 and 2015  . CHOLECYSTECTOMY    . COLPOSCOPY  2015  . DILATION AND CURETTAGE OF UTERUS    . GALLBLADDER SURGERY  2016  . HERNIA REPAIR    . HYDRADENITIS EXCISION Bilateral 06/05/2016   Procedure: , right axilla;  Surgeon: Leafy Ro, MD;  Location: [facility] ORS;  Service: General;  Laterality: Bilateral;  . INGUINAL HERNIA REPAIR  2016  . TUBAL LIGATION    . TUBAL LIGATION    . TUBAL LIGATION  2016     Family History  Problem Relation Age of Onset  . Hypertension Mother   . Breast cancer Maternal Aunt   . Uterine cancer Maternal Grandmother   . Diabetes Maternal Grandfather     Social History   Socioeconomic History  . Marital status: Single    Spouse name: Not on file  . Number of children: Not on file  . Years of education: Not on file  . Highest education level: Not on file  Occupational History  . Not on file  Social Needs  . Financial resource strain: Not very hard  . Food insecurity:    Worry: Never true    Inability: Never true  . Transportation needs:    Medical: No    Non-medical: No  Tobacco Use  . Smoking status: Current Every Day Smoker    Packs/day: 0.50    Types: Cigarettes  . Smokeless tobacco: Never Used  Substance and Sexual Activity  . Alcohol use: Yes    Alcohol/week: 6.0 standard drinks    Types: 6 Cans of beer per week    Comment: weekly  . Drug use: No  . Sexual activity: Yes    Birth control/protection: Surgical    Comment: s/p tubal ligation  Lifestyle  . Physical activity:    Days per week: 0 days    Minutes per session: 0 min  . Stress: Only a little  Relationships  . Social connections:    Talks on phone: More than three times a week    Gets together: More than three times a week    Attends religious service: More than 4 times per year    Active member of club or organization: No    Attends meetings of clubs or organizations: Never    Relationship status: Not on file  . Intimate partner violence:    Fear of current or ex partner: No    Emotionally abused: No    Physically abused: No    Forced sexual activity: No  Other Topics Concern  . Not on file  Social History Narrative  . Not on file    ROS Review of Systems  Constitutional: Negative for activity change, appetite change, diaphoresis, fatigue and fever.  Respiratory: Negative for cough, chest tightness and shortness of breath.   Cardiovascular: Negative for  chest pain, palpitations and leg swelling.  Gastrointestinal: Positive for abdominal distention (intermittent with gas), abdominal pain (intermittent with gas or looser stool), constipation (flucuates with loose stool) and diarrhea (fluctuates with constipation). Negative for nausea and vomiting.  Genitourinary: Negative for dysuria, frequency, hematuria, pelvic pain and urgency.  Musculoskeletal: Negative.   Skin: Negative for rash and wound.  Neurological: Negative for dizziness, syncope, weakness, light-headedness, numbness and headaches.  Psychiatric/Behavioral: Negative.     Objective:   Today's Vitals: BP 103/60 (BP Location: Left Arm, Patient Position: Sitting, Cuff Size: Normal)   Pulse 71   Temp 98.9 F (37.2 C) (Oral)   Ht 5\' 2"  (1.575 m)  Wt 208 lb 12.8 oz (94.7 kg)   SpO2 98%   BMI 38.19 kg/m   Physical Exam Vitals signs and nursing note reviewed.  Constitutional:      General: She is awake.     Appearance: She is well-developed. She is obese.  HENT:     Head: Normocephalic.  Eyes:     General:        Right eye: No discharge.        Left eye: No discharge.     Conjunctiva/sclera: Conjunctivae normal.     Pupils: Pupils are equal, round, and reactive to light.  Neck:     Musculoskeletal: Normal range of motion and neck supple.     Thyroid: No thyromegaly.     Vascular: No carotid bruit or JVD.  Cardiovascular:     Rate and Rhythm: Normal rate and regular rhythm.     Heart sounds: Normal heart sounds.  Pulmonary:     Effort: Pulmonary effort is normal.     Breath sounds: Normal breath sounds.  Abdominal:     General: Bowel sounds are normal.     Palpations: Abdomen is soft. There is no hepatomegaly or splenomegaly.     Tenderness: There is no abdominal tenderness. There is no guarding or rebound.     Hernia: No hernia is present.  Lymphadenopathy:     Cervical: No cervical adenopathy.  Skin:    General: Skin is warm and dry.  Neurological:     Mental  Status: She is alert and oriented to person, place, and time.  Psychiatric:        Mood and Affect: Mood normal.        Behavior: Behavior normal. Behavior is cooperative.        Thought Content: Thought content normal.        Judgment: Judgment normal.     Assessment & Plan:   Problem List Items Addressed This Visit      Musculoskeletal and Integument   Hidradenitis suppurativa    Chronic, stable at this time.  Referral to dermatology to establish care and provide recommendations.      Relevant Orders   Ambulatory referral to Dermatology     Other   History of abnormal cervical Pap smear    [facility] for [facility]YN assessment and care d/t h/o frequent abnormal paps requiring colpo.        Relevant Orders   Ambulatory referral to Gynecology   Abdominal bloating - Primary    Presents like IBS.  Patient refuses labs today, but states she will obtain at next visit.  Recommend trial of probiotic tablet twice a day.  Referral to GI for further evaluation, since has been ongoing since removal of gall bladder, and recommendations.      Relevant Orders   Ambulatory referral to Gastroenterology      Outpatient Encounter Medications as of 10/10/2018  Medication Sig  . clindamycin (CLEOCIN) 150 MG capsule Take 2 capsules (300 mg total) by mouth 3 (three) times daily. (Patient not taking: Reported on 10/10/2018)  . [DISCONTINUED] furosemide (LASIX) 20 MG tablet Take 1 tablet (20 mg total) by mouth daily as needed for edema. (Patient not taking: Reported on 10/10/2018)  . [DISCONTINUED] oxyCODONE-acetaminophen (PERCOCET/ROXICET) 5-325 MG tablet Take 1 tablet by mouth every 4 (four) hours as needed for severe pain. (Patient not taking: Reported on 10/10/2018)   No facility-administered encounter medications on file as of 10/10/2018.     Follow-up: Return in  about 2 weeks (around 10/24/2018) for follow-up.   Marjie Skiff, NP

## 2018-10-10 NOTE — Assessment & Plan Note (Signed)
[facility] for [facility]YN assessment and care d/t h/o frequent abnormal paps requiring colpo.

## 2018-10-10 NOTE — Patient Instructions (Signed)

## 2018-10-17 ENCOUNTER — Ambulatory Visit: Payer: Medicaid Other | Admitting: Nurse Practitioner

## 2018-10-17 ENCOUNTER — Encounter: Payer: Self-pay | Admitting: Nurse Practitioner

## 2018-10-17 VITALS — BP 130/68 | HR 96 | Temp 98.7°F | Wt 188.0 lb

## 2018-10-17 DIAGNOSIS — R6889 Other general symptoms and signs: Secondary | ICD-10-CM | POA: Insufficient documentation

## 2018-10-17 DIAGNOSIS — R1084 Generalized abdominal pain: Secondary | ICD-10-CM | POA: Diagnosis not present

## 2018-10-17 LAB — CBC WITH DIFFERENTIAL/PLATELET
HEMATOCRIT: 48.3 % — AB (ref 34.0–46.6)
Hemoglobin: 16.2 g/dL — ABNORMAL HIGH (ref 11.1–15.9)
LYMPHS ABS: 1.4 10*3/uL (ref 0.7–3.1)
LYMPHS: 35 %
MCH: 29.1 pg (ref 26.6–33.0)
MCHC: 33.5 g/dL (ref 31.5–35.7)
MCV: 87 fL (ref 79–97)
MID (Absolute): 0.7 10*3/uL (ref 0.1–1.6)
MID: 16 %
NEUTROS PCT: 51 %
Neutrophils Absolute: 2.1 10*3/uL (ref 1.4–7.0)
Platelets: 194 10*3/uL (ref 150–450)
RBC: 5.56 x10E6/uL — ABNORMAL HIGH (ref 3.77–5.28)
RDW: 14 % (ref 12.3–15.4)
WBC: 4.1 10*3/uL (ref 3.4–10.8)

## 2018-10-17 LAB — VERITOR FLU A/B WAIVED
INFLUENZA B: NEGATIVE
Influenza A: NEGATIVE

## 2018-10-17 MED ORDER — PROMETHAZINE HCL 12.5 MG PO TABS: 12.5000 mg | ORAL_TABLET | Freq: Three times a day (TID) | ORAL | 0 refills | Status: DC | PRN

## 2018-10-17 MED ORDER — OSELTAMIVIR PHOSPHATE 75 MG PO CAPS: 75.0000 mg | ORAL_CAPSULE | Freq: Two times a day (BID) | ORAL | 0 refills | Status: AC

## 2018-10-17 NOTE — Assessment & Plan Note (Signed)
Flu A & B negative.  Based on symptoms and exposure will initiate Tamiflu 75 MG BID x 5 days and script for Phenergan 12.5 MG as needed.  Recommend increased fluid intake at home.  Will obtain CBC and CMP today.  Return for worsening or continued symptoms.  Recommended if worsening symptoms over weekend and concern for poor oral intake, then immediately present to ER for possible IV hydration and repeat labs.

## 2018-10-17 NOTE — Patient Instructions (Addendum)
Upcoming appointments:   [facility] [address] [place],  [place]  [address] [facility]: [phone]  Scheduled for Feb. 3, 2020 @ 9:30 a.m.  Influenza, Adult Influenza is also called "the flu." It is an infection in the lungs, nose, and throat (respiratory tract). It is caused by a virus. The flu causes symptoms that are similar to symptoms of a cold. It also causes a high fever and body aches. The flu spreads easily from person to person (is contagious). Getting a flu shot (influenza vaccination) every year is the best way to prevent the flu. What are the causes? This condition is caused by the influenza virus. You can get the virus by:  Breathing in droplets that are in the air from the cough or sneeze of a person who has the virus.  Touching something that has the virus on it (is contaminated) and then touching your mouth, nose, or eyes. What increases the risk? Certain things may make you more likely to get the flu. These include:  Not washing your hands often.  Having close contact with many people during cold and flu season.  Touching your mouth, eyes, or nose without first washing your hands.  Not getting a flu shot every year. You may have a higher risk for the flu, along with serious problems such as a lung infection (pneumonia), if you:  Are older than 65.  Are pregnant.  Have a weakened disease-fighting system (immune system) because of a disease or taking certain medicines.  Have a long-term (chronic) illness, such as: ? Heart, kidney, or lung disease. ? Diabetes. ? Asthma.  Have a liver disorder.  Are very overweight (morbidly obese).  Have anemia. This is a condition that affects your red blood cells. What are the signs or symptoms? Symptoms usually begin suddenly and last 4-14 days. They may include:  Fever and chills.  Headaches, body aches, or muscle aches.  Sore throat.  Cough.  Runny or stuffy (congested)  nose.  Chest discomfort.  Not wanting to eat as much as normal (poor appetite).  Weakness or feeling tired (fatigue).  Dizziness.  Feeling sick to your stomach (nauseous) or throwing up (vomiting). How is this treated? If the flu is found early, you can be treated with medicine that can help reduce how bad the illness is and how long it lasts (antiviral medicine). This may be given by mouth (orally) or through an IV tube. Taking care of yourself at home can help your symptoms get better. Your doctor may suggest:  Taking over-the-counter medicines.  Drinking plenty of fluids. The flu often goes away on its own. If you have very bad symptoms or other problems, you may be treated in a hospital. Follow these instructions at home:     Activity  Rest as needed. Get plenty of sleep.  Stay home from work or school as told by your doctor. ? Do not leave home until you do not have a fever for 24 hours without taking medicine. ? Leave home only to visit your doctor. Eating and drinking  Take an ORS (oral rehydration solution). This is a drink that is sold at pharmacies and stores.  Drink enough fluid to keep your pee (urine) pale yellow.  Drink clear fluids in small amounts as you are able. Clear fluids include: ? Water. ? Ice chips. ? Fruit juice that has water added (diluted fruit juice). ? Low-calorie sports drinks.  Eat bland, easy-to-digest foods in small amounts as you  are able. These foods include: ? Bananas. ? Applesauce. ? Rice. ? Lean meats. ? Toast. ? Crackers.  Do not eat or drink: ? Fluids that have a lot of sugar or caffeine. ? Alcohol. ? Spicy or fatty foods. General instructions  Take over-the-counter and prescription medicines only as told by your doctor.  Use a cool mist humidifier to add moisture to the air in your home. This can make it easier for you to breathe.  Cover your mouth and nose when you cough or sneeze.  Wash your hands with soap and  water often, especially after you cough or sneeze. If you cannot use soap and water, use alcohol-based hand sanitizer.  Keep all follow-up visits as told by your doctor. This is important. How is this prevented?   Get a flu shot every year. You may get the flu shot in late summer, fall, or winter. Ask your doctor when you should get your flu shot.  Avoid contact with people who are sick during fall and winter (cold and flu season). Contact a doctor if:  You get new symptoms.  You have: ? Chest pain. ? Watery poop (diarrhea). ? A fever.  Your cough gets worse.  You start to have more mucus.  You feel sick to your stomach.  You throw up. Get help right away if you:  Have shortness of breath.  Have trouble breathing.  Have skin or nails that turn a bluish color.  Have very bad pain or stiffness in your neck.  Get a sudden headache.  Get sudden pain in your face or ear.  Cannot eat or drink without throwing up. Summary  Influenza ("the flu") is an infection in the lungs, nose, and throat. It is caused by a virus.  Take over-the-counter and prescription medicines only as told by your doctor.  Getting a flu shot every year is the best way to avoid getting the flu. This information is not intended to replace advice given to you by your health care provider. Make sure you discuss any questions you have with your health care provider. Document Released: 07/10/2008 Document Revised: 03/19/2018 Document Reviewed: 03/19/2018 Elsevier Interactive Patient Education  2019 [employer].

## 2018-10-17 NOTE — Progress Notes (Signed)
BP 130/68 (BP Location: Left Arm, Patient Position: Sitting)   Pulse 96   Temp 98.7 F (37.1 C) (Oral)   Wt 188 lb (85.3 kg)   SpO2 99%   BMI 34.39 kg/m    Subjective:    Patient ID: Samantha Orr, female    DOB: 08/07/1986, 33 y.o.   MRN: [id number]  HPI: Samantha Orr is a 33 y.o. female  Chief Complaint  Patient presents with  . Emesis    X 1 week   EMESIS x 1 WEEK: States that she has been around her niece and nephew who she believes have the flu.  Started with URI symptoms.  Had chills initially, which have now improved, but has all over muscle aches.  She reports she can not take Zofran, states it does not work.   Worst symptom: emesis Fever: yes with chills and all over muscle aches Cough: no Shortness of breath: no Wheezing: no Chest pain:none Chest tightness: no Chest congestion: no Nasal congestion: yes Runny nose: yes Post nasal drip: yes Sneezing: no Sore throat: no Swollen glands: no Sinus pressure: no Headache: no Face pain: no Toothache: no Ear pain: none Ear pressure: none Eyes red/itching:no Eye drainage/crusting: no  Vomiting: yes Rash: no Fatigue: yes Sick contacts: yes Strep contacts: no  Context: fluctuating Recurrent sinusitis: no Relief with OTC cold/cough medications: no  Treatments attempted: cold/sinus   Relevant past medical, surgical, family and social history reviewed and updated as indicated. Interim medical history since our last visit reviewed. Allergies and medications reviewed and updated.  Review of Systems  Constitutional: Positive for fatigue and fever. Negative for activity change, appetite change and diaphoresis.  HENT: Positive for congestion, postnasal drip and rhinorrhea. Negative for sinus pressure, sinus pain, sneezing, sore throat and voice change.   Eyes: Negative for itching and visual disturbance.  Respiratory: Negative for cough, chest tightness, shortness of breath and wheezing.   Cardiovascular:  Negative for chest pain, palpitations and leg swelling.  Gastrointestinal: Positive for nausea and vomiting. Negative for abdominal distention, abdominal pain, constipation and diarrhea.  Endocrine: Negative.   Musculoskeletal: Positive for myalgias.  Neurological: Negative for dizziness, syncope, weakness, light-headedness, numbness and headaches.  Psychiatric/Behavioral: Negative.     Per HPI unless specifically indicated above     Objective:    BP 130/68 (BP Location: Left Arm, Patient Position: Sitting)   Pulse 96   Temp 98.7 F (37.1 C) (Oral)   Wt 188 lb (85.3 kg)   SpO2 99%   BMI 34.39 kg/m   Wt Readings from Last 3 Encounters:  10/17/18 188 lb (85.3 kg)  10/10/18 208 lb 12.8 oz (94.7 kg)  04/03/18 214 lb (97.1 kg)    Physical Exam Vitals signs and nursing note reviewed.  Constitutional:      General: She is awake.     Appearance: She is well-developed.  HENT:     Head: Normocephalic.     Right Ear: Hearing, ear canal and external ear normal. A middle ear effusion is present.     Left Ear: Hearing, ear canal and external ear normal. A middle ear effusion is present.     Nose: Mucosal edema and rhinorrhea present. Rhinorrhea is clear.     Right Sinus: No maxillary sinus tenderness or frontal sinus tenderness.     Left Sinus: No maxillary sinus tenderness or frontal sinus tenderness.     Mouth/Throat:     Mouth: Mucous membranes are moist.     Pharynx:  No oropharyngeal exudate or posterior oropharyngeal erythema.  Eyes:     General: Lids are normal.        Right eye: No discharge.        Left eye: No discharge.     Conjunctiva/sclera: Conjunctivae normal.     Pupils: Pupils are equal, round, and reactive to light.  Neck:     Musculoskeletal: Normal range of motion and neck supple.     Thyroid: No thyromegaly.     Vascular: No carotid bruit or JVD.  Cardiovascular:     Rate and Rhythm: Normal rate and regular rhythm.     Heart sounds: Normal heart sounds.    Pulmonary:     Effort: Pulmonary effort is normal.     Breath sounds: Normal breath sounds.  Abdominal:     General: Bowel sounds are normal. There is no distension.     Palpations: Abdomen is soft. There is no hepatomegaly or splenomegaly.     Tenderness: There is generalized abdominal tenderness. There is no right CVA tenderness, left CVA tenderness, guarding or rebound. Negative signs include Murphy's sign.  Lymphadenopathy:     Head:     Right side of head: No submental, submandibular or tonsillar adenopathy.     Left side of head: No submental, submandibular or tonsillar adenopathy.     Cervical: No cervical adenopathy.  Skin:    General: Skin is warm and dry.  Neurological:     Mental Status: She is alert and oriented to person, place, and time.  Psychiatric:        Attention and Perception: Attention normal.        Mood and Affect: Mood normal.        Behavior: Behavior normal. Behavior is cooperative.        Thought Content: Thought content normal.        Judgment: Judgment normal.     Results for orders placed or performed during the hospital encounter of 04/03/18  Group A Strep by PCR  Result Value Ref Range   Group A Strep by PCR DETECTED (A) NOT DETECTED      Assessment & Plan:   Problem List Items Addressed This Visit      Other   Flu-like symptoms - Primary    Flu A & B negative.  Based on symptoms and exposure will initiate Tamiflu 75 MG BID x 5 days and script for Phenergan 12.5 MG as needed.  Recommend increased fluid intake at home.  Will obtain CBC and CMP today.  Return for worsening or continued symptoms.  Recommended if worsening symptoms over weekend and concern for poor oral intake, then immediately present to ER for possible IV hydration and repeat labs.      Relevant Orders   Veritor Flu A/B Waived    Other Visit Diagnoses    Generalized abdominal pain       Relevant Orders   CBC With Differential/Platelet   Comprehensive metabolic panel        Follow up plan: Return  and as scheduled currently.

## 2018-10-19 LAB — COMPREHENSIVE METABOLIC PANEL
ALK PHOS: 78 IU/L (ref 39–117)
ALT: 31 IU/L (ref 0–32)
AST: 29 IU/L (ref 0–40)
Albumin/Globulin Ratio: 1.3 (ref 1.2–2.2)
Albumin: 4.2 g/dL (ref 3.5–5.5)
BUN/Creatinine Ratio: 7 — ABNORMAL LOW (ref 9–23)
BUN: 5 mg/dL — ABNORMAL LOW (ref 6–20)
Bilirubin Total: <0.2 mg/dL (ref 0.0–1.2)
CO2: 15 mmol/L — ABNORMAL LOW (ref 20–29)
Calcium: 8.8 mg/dL (ref 8.7–10.2)
Chloride: 102 mmol/L (ref 96–106)
Creatinine, Ser: 0.74 mg/dL (ref 0.57–1.00)
GFR calc Af Amer: 122 mL/min/{1.73_m2} (ref >59)
GFR calc non Af Amer: 106 mL/min/{1.73_m2} (ref >59)
Globulin, Total: 3.2 g/dL (ref 1.5–4.5)
Glucose: 106 mg/dL — ABNORMAL HIGH (ref 65–99)
Potassium: 3.6 mmol/L (ref 3.5–5.2)
Sodium: 140 mmol/L (ref 134–144)
TOTAL PROTEIN: 7.4 g/dL (ref 6.0–8.5)

## 2018-10-24 ENCOUNTER — Ambulatory Visit: Payer: Medicaid Other | Admitting: Nurse Practitioner

## 2018-11-05 DIAGNOSIS — Z01419 Encounter for gynecological examination (general) (routine) without abnormal findings: Secondary | ICD-10-CM | POA: Diagnosis not present

## 2018-11-05 DIAGNOSIS — N6452 Nipple discharge: Secondary | ICD-10-CM | POA: Diagnosis not present

## 2018-11-05 DIAGNOSIS — Z0000 Encounter for general adult medical examination without abnormal findings: Secondary | ICD-10-CM | POA: Diagnosis not present

## 2018-11-05 LAB — HM PAP SMEAR: HM Pap smear: NEGATIVE

## 2018-11-06 ENCOUNTER — Other Ambulatory Visit: Payer: Self-pay | Admitting: Certified Nurse Midwife

## 2018-11-06 DIAGNOSIS — N6452 Nipple discharge: Secondary | ICD-10-CM

## 2018-11-12 ENCOUNTER — Ambulatory Visit
Admission: RE | Admit: 2018-11-12 | Discharge: 2018-11-12 | Disposition: A | Discharge status: Home / Self Care | Payer: Medicaid Other | Source: Ambulatory Visit | Attending: Certified Nurse Midwife | Admitting: Certified Nurse Midwife

## 2018-11-12 DIAGNOSIS — N6452 Nipple discharge: Secondary | ICD-10-CM | POA: Insufficient documentation

## 2018-11-12 DIAGNOSIS — N6489 Other specified disorders of breast: Secondary | ICD-10-CM | POA: Diagnosis not present

## 2018-11-12 DIAGNOSIS — R928 Other abnormal and inconclusive findings on diagnostic imaging of breast: Secondary | ICD-10-CM | POA: Diagnosis not present

## 2018-11-13 DIAGNOSIS — L0291 Cutaneous abscess, unspecified: Secondary | ICD-10-CM | POA: Diagnosis not present

## 2018-11-13 DIAGNOSIS — L732 Hidradenitis suppurativa: Secondary | ICD-10-CM | POA: Diagnosis not present

## 2018-11-13 DIAGNOSIS — L905 Scar conditions and fibrosis of skin: Secondary | ICD-10-CM | POA: Diagnosis not present

## 2018-11-17 ENCOUNTER — Encounter: Payer: Self-pay | Admitting: Gastroenterology

## 2018-11-17 ENCOUNTER — Ambulatory Visit: Payer: Medicaid Other | Admitting: Gastroenterology

## 2018-12-18 ENCOUNTER — Other Ambulatory Visit: Payer: Self-pay

## 2018-12-18 ENCOUNTER — Ambulatory Visit: Payer: Medicaid Other | Admitting: Gastroenterology

## 2018-12-18 ENCOUNTER — Encounter: Payer: Self-pay | Admitting: Gastroenterology

## 2018-12-18 VITALS — BP 104/69 | HR 76 | Ht 62.0 in | Wt 205.4 lb

## 2018-12-18 DIAGNOSIS — Z9049 Acquired absence of other specified parts of digestive tract: Secondary | ICD-10-CM

## 2018-12-18 DIAGNOSIS — R197 Diarrhea, unspecified: Secondary | ICD-10-CM

## 2018-12-18 DIAGNOSIS — K219 Gastro-esophageal reflux disease without esophagitis: Secondary | ICD-10-CM

## 2018-12-18 DIAGNOSIS — K9189 Other postprocedural complications and disorders of digestive system: Secondary | ICD-10-CM

## 2018-12-18 MED ORDER — CHOLESTYRAMINE 4 G PO PACK: 4.0000 g | PACK | Freq: Every day | ORAL | 1 refills | Status: DC

## 2018-12-18 NOTE — Progress Notes (Signed)
Samantha BouillonVarnita Harlyn Orr [address]  [address]  [place], [place] [address]  Main: [phone]  Fax: [phone]   Gastroenterology Consultation  Referring Provider:     Marjie Skiffannady, Jolene T, NP Primary Care Physician:  Marjie Skiffannady, Jolene T, NP Reason for Consultation:     Abdominal cramping, reflux        HPI:    Chief Complaint  Patient presents with  . New Patient (Initial Visit)    referred for bloating; pt c/o severe stomach pain at times, cramps since hernia surgery, frequent bm's    Samantha Orr is a 33 y.o. y/o female referred for consultation & management  by Dr. Marjie Skiffannady, Jolene T, NP.  Patient reports 5-year history of intermittent abdominal spasms, and intermittent reflux.  Reflux occurs 3-4 times a week, specifically with fatty foods or spicy foods.  Does not take any medications for this.  States she was previously told it may be due to "gas".  No prior EGD or colonoscopy.  No family history of colon cancer.  No blood in stool.  Has had previous history of cholecystectomy and states this was done due to gallstones.  Since then has had multiple bowel movements a day, 4-5 a day, but she states they are not loose.  No dysphagia.  Past Medical History:  Diagnosis Date  . Abscess   . Complication of anesthesia    hr/bp dropped after c/s 2015 in pac[facility]   had spinal  . GERD (gastroesophageal reflux disease)    sometimes throws up when gas gets in her chest  . Shortness of breath dyspnea     Past Surgical History:  Procedure Laterality Date  . ABCESS DRAINAGE     BUTTOCK  . ABDOMINAL SURGERY     tubal, hernia and gallbladder  . CESAREAN SECTION    . CESAREAN SECTION  2012 and 2015  . CHOLECYSTECTOMY    . COLPOSCOPY  2015  . DILATION AND CURETTAGE OF UTERUS    . GALLBLADDER SURGERY  2016  . HERNIA REPAIR    . HYDRADENITIS EXCISION Bilateral 06/05/2016   Procedure: , right axilla;  Surgeon: Leafy Roiego F Pabon, MD;  Location: [facility] ORS;  Service: General;  Laterality:  Bilateral;  . INGUINAL HERNIA REPAIR  2016  . TUBAL LIGATION    . TUBAL LIGATION    . TUBAL LIGATION  2016    Prior to Admission medications   Medication Sig Start Date End Date Taking? Authorizing Provider  clindamycin (CLEOCIN) 150 MG capsule Take 2 capsules (300 mg total) by mouth 3 (three) times daily. 01/01/18  Yes Fisher, Roselyn BeringSusan W, PA-C  cholestyramine Lanetta Inch(QUESTRAN) 4 g packet Take 1 packet (4 g total) by mouth daily. 12/18/18   Pasty Spillersahiliani, Keron Koffman B, MD  promethazine (PHENERGAN) 12.5 MG tablet Take 1 tablet (12.5 mg total) by mouth every 8 (eight) hours as needed for nausea or vomiting. Patient not taking: Reported on 12/18/2018 10/17/18   Aura Dialsannady, Jolene T, NP    Family History  Problem Relation Age of Onset  . Hypertension Mother   . Breast cancer Maternal Aunt   . Uterine cancer Maternal Grandmother   . Diabetes Maternal Grandfather   . Breast cancer Maternal Grandfather        258     Social History   Tobacco Use  . Smoking status: Current Every Day Smoker    Packs/day: 0.50    Types: Cigarettes  . Smokeless tobacco: Never Used  Substance Use Topics  . Alcohol  use: Yes    Alcohol/week: 6.0 standard drinks    Types: 6 Cans of beer per week    Comment: weekly  . Drug use: No    Allergies as of 12/18/2018 - Review Complete 12/18/2018  Allergen Reaction Noted  . Bactrim [sulfamethoxazole-trimethoprim] Hives 09/28/2014    Review of Systems:    All systems reviewed and negative except where noted in HPI.   Physical Exam:  BP 104/69   Pulse 76   Ht 5\' 2"  (1.575 m)   Wt 205 lb 6.4 oz (93.2 kg)   BMI 37.57 kg/m  No LMP recorded. Psych:  Alert and cooperative. Normal mood and affect. General:   Alert,  Well-developed, well-nourished, pleasant and cooperative in NAD Head:  Normocephalic and atraumatic. Eyes:  Sclera clear, no icterus.   Conjunctiva pink. Ears:  Normal auditory acuity. Nose:  No deformity, discharge, or lesions. Mouth:  No deformity or  lesions,oropharynx pink & moist. Neck:  Supple; no masses or thyromegaly. Abdomen:  Normal bowel sounds.  No bruits.  Soft, non-tender and non-distended without masses, hepatosplenomegaly or hernias noted.  No guarding or rebound tenderness.    Msk:  Symmetrical without gross deformities. Good, equal movement & strength bilaterally. Pulses:  Normal pulses noted. Extremities:  No clubbing or edema.  No cyanosis. Neurologic:  Alert and oriented x3;  grossly normal neurologically. Skin:  Intact without significant lesions or rashes. No jaundice. Lymph Nodes:  No significant cervical adenopathy. Psych:  Alert and cooperative. Normal mood and affect.   Labs: CBC    Component Value Date/Time   WBC 4.1 10/17/2018 1448   WBC 9.3 02/17/2018 1347   RBC 5.56 (H) 10/17/2018 1448   RBC 4.61 02/17/2018 1347   HGB 16.2 (H) 10/17/2018 1448   HCT 48.3 (H) 10/17/2018 1448   PLT 194 10/17/2018 1448   MCV 87 10/17/2018 1448   MCH 29.1 10/17/2018 1448   MCH 28.9 02/17/2018 1347   MCHC 33.5 10/17/2018 1448   MCHC 33.0 02/17/2018 1347   RDW 14.0 10/17/2018 1448   LYMPHSABS 1.4 10/17/2018 1448   MONOABS 0.6 02/17/2018 1347   EOSABS 0.2 02/17/2018 1347   BASOSABS 0.1 02/17/2018 1347   CMP     Component Value Date/Time   NA 140 10/17/2018 1456   K 3.6 10/17/2018 1456   CL 102 10/17/2018 1456   CO2 15 (L) 10/17/2018 1456   GLUCOSE 106 (H) 10/17/2018 1456   GLUCOSE 92 02/17/2018 1347   BUN 5 (L) 10/17/2018 1456   CREATININE 0.74 10/17/2018 1456   CALCIUM 8.8 10/17/2018 1456   PROT 7.4 10/17/2018 1456   ALBUMIN 4.2 10/17/2018 1456   AST 29 10/17/2018 1456   ALT 31 10/17/2018 1456   ALKPHOS 78 10/17/2018 1456   BILITOT <0.2 10/17/2018 1456   GFRNONAA 106 10/17/2018 1456   GFRAA 122 10/17/2018 1456    Imaging Studies: No results found.  Assessment and Plan:   Samantha Orr is a 33 y.o. y/o female has been referred for abdominal cramping and reflux  Given her ongoing reflux, we  discussed conservative management with PPI versus proceeding with EGD for further evaluation Patient would prefer to proceed with EGD at this time Patient educated extensively on acid reflux lifestyle modification, including buying a bed wedge, not eating 3 hrs before bedtime, diet modifications, and handout given for the same.   Her multiple bowel movements today started after her cholecystectomy We will start cholestyramine and increase dose as needed  I have  discussed alternative options, risks & benefits,  which include, but are not limited to, bleeding, infection, perforation,respiratory complication & drug reaction.  The patient agrees with this plan & written consent will be obtained.      Dr Samantha Orr  Speech recognition software was used to dictate the above note.

## 2019-01-01 ENCOUNTER — Telehealth: Payer: Self-pay

## 2019-01-01 NOTE — Telephone Encounter (Signed)
Left detailed voice message on home phone to make patient aware of her procedure cancellation for 01/08/19 and she will be contacted to reschedule.  Patient has also been made aware of her rx for pepcid that should be started.  Thanks [employer]

## 2019-01-06 ENCOUNTER — Other Ambulatory Visit: Payer: Self-pay

## 2019-01-06 MED ORDER — FAMOTIDINE 20 MG PO TABS: 20.0000 mg | ORAL_TABLET | Freq: Every day | ORAL | 1 refills | Status: DC

## 2019-01-08 ENCOUNTER — Ambulatory Visit: Admission: RE | Admit: 2019-01-08 | Payer: Medicaid Other | Source: Home / Self Care | Admitting: Gastroenterology

## 2019-01-08 ENCOUNTER — Encounter: Admission: RE | Payer: Self-pay | Source: Home / Self Care

## 2019-01-08 SURGERY — ESOPHAGOGASTRODUODENOSCOPY (EGD) WITH PROPOFOL
Anesthesia: General

## 2019-08-12 ENCOUNTER — Emergency Department
Admission: EM | Admit: 2019-08-12 | Discharge: 2019-08-12 | Disposition: A | Discharge status: Home / Self Care | Payer: Medicaid Other | Attending: Emergency Medicine | Admitting: Emergency Medicine

## 2019-08-12 ENCOUNTER — Other Ambulatory Visit: Payer: Self-pay

## 2019-08-12 DIAGNOSIS — K047 Periapical abscess without sinus: Secondary | ICD-10-CM | POA: Diagnosis not present

## 2019-08-12 DIAGNOSIS — Z79899 Other long term (current) drug therapy: Secondary | ICD-10-CM | POA: Diagnosis not present

## 2019-08-12 DIAGNOSIS — K0889 Other specified disorders of teeth and supporting structures: Secondary | ICD-10-CM | POA: Diagnosis present

## 2019-08-12 DIAGNOSIS — F17210 Nicotine dependence, cigarettes, uncomplicated: Secondary | ICD-10-CM | POA: Diagnosis not present

## 2019-08-12 MED ORDER — LIDOCAINE VISCOUS HCL 2 % MT SOLN: 10.0000 mL | OROMUCOSAL | 0 refills | Status: DC | PRN

## 2019-08-12 MED ORDER — AMOXICILLIN 500 MG PO CAPS: 500.0000 mg | ORAL_CAPSULE | Freq: Three times a day (TID) | ORAL | 0 refills | Status: DC

## 2019-08-12 MED ORDER — AMOXICILLIN 500 MG PO CAPS
500.0000 mg | ORAL_CAPSULE | Freq: Once | ORAL | Status: AC
  Administered 2019-08-12: 500 mg via ORAL
  Filled 2019-08-12: qty 1

## 2019-08-12 MED ORDER — KETOROLAC TROMETHAMINE 30 MG/ML IJ SOLN
30.0000 mg | Freq: Once | INTRAMUSCULAR | Status: DC
  Filled 2019-08-12: qty 1

## 2019-08-12 MED ORDER — LIDOCAINE VISCOUS HCL 2 % MT SOLN
15.0000 mL | Freq: Once | OROMUCOSAL | Status: AC
  Administered 2019-08-12: 15 mL via OROMUCOSAL
  Filled 2019-08-12: qty 15

## 2019-08-12 NOTE — ED Notes (Signed)
Pt reporting the lidocaine has helped with her pain and she does not want the injection.

## 2019-08-12 NOTE — Discharge Instructions (Signed)
OPTIONS FOR DENTAL FOLLOW UP CARE ° °[place] Department of Health and Human Services - Local Safety Net Dental Clinics °http://www.ncdhhs.gov/dph/oralhealth/services/safetynetclinics.htm °  °[facility] (336-562-3123) ° °[facility] (919-933-9087) ° °[facility] ([phone] ext [phone]) ° °[facility] ([phone]) ° °[facility] ([phone]) °This clinic caters to the indigent population and is on a lottery system. °Location: °[facility], [address], [place] °Clinic Hours: °Wednesdays from 6pm - 9pm, patients seen by a lottery system. °For dates, call or go to www.med.[facility].edu/[facility]/patients/Dental-[facility] °Services: °Cleanings, fillings and simple extractions. °Payment Options: °DENTAL WORK IS FREE OF CHARGE. Bring proof of income or support. °Best way to get seen: °Arrive at 5:15 pm - this is a lottery, NOT first come/first serve, so arriving earlier will not increase your chances of being seen. °  °  °[facility] °[phone] °Select option 1 for emergencies °  °Location: °[facility], [address], [place] °Clinic Hours: °No walk-ins accepted - call the day before to schedule an appointment. °Check in times are 9:30 am and 1:30 pm. °Services: °Simple extractions, temporary fillings, pulpectomy/pulp debridement, uncomplicated abscess drainage. °Payment Options: °PAYMENT IS DUE AT THE TIME OF SERVICE.  Fee is usually $100-200, additional surgical procedures (e.g. abscess drainage) may be extra. °Cash, checks, Visa/MasterCard accepted.  Can file Medicaid if patient is covered for dental - patient should call case worker to check. °No discount for [facility] patients. °Best way to get seen: °MUST call the day before and get onto the schedule. Can usually be seen the next 1-2 days. No walk-ins accepted. °  °  °[facility] °[phone] °   °Location: °[facility], [address], [place] °Clinic Hours: °M, W, Th, F 8am or 1:30pm, Tues 9a or 1:30 - first come/first served. °Services: °Simple extractions, temporary fillings, uncomplicated abscess drainage.  You do not need to be an [place] resident. °Payment Options: °PAYMENT IS DUE AT THE TIME OF SERVICE. °Dental insurance, otherwise sliding scale - bring proof of income or support. °Depending on income and treatment needed, cost is usually $50-200. °Best way to get seen: °Arrive early as it is first come/first served. °  °  °[facility] °[phone] °  °Location: °[address] °Clinic Hours: °Mon-Thu 8a-5p °Services: °Most basic dental services including extractions and fillings. °Payment Options: °PAYMENT IS DUE AT THE TIME OF SERVICE. °Sliding scale, up to 50% off - bring proof if income or support. °Medicaid with dental option accepted. °Best way to get seen: °Call to schedule an appointment, can usually be seen within 2 weeks OR they will try to see walk-ins - show up at 8a or 2p (you may have to wait). °  °  °[facility] °919-245-2435 °[place] RESIDENTS ONLY °  °Location: °[facility], [address], [place], [place] [address] °Clinic Hours: By appointment only. °Monday - Thursday 8am-5pm, Friday 8am-12pm °Services: Cleanings, fillings, extractions. °Payment Options: °PAYMENT IS DUE AT THE TIME OF SERVICE. °Cash, Visa or MasterCard. Sliding scale - $30 minimum per service. °Best way to get seen: °Come in to office, complete packet and make an appointment - need proof of income °or support monies for each household member and proof of [place] residence. °Usually takes about a month to get in. °  °  °[facility] °[phone] °  °Location: °[address]   [place] °Clinic Hours: Walk-in Urgent Care Dental Services are offered Monday-Friday  mornings only. °The numbers of emergencies accepted daily is limited to the number of °providers available. °Maximum 15 - Mondays, Wednesdays & Thursdays °Maximum 10 - Tuesdays & Fridays °Services: °You do not need to be a [place] resident to be seen for a dental emergency. °Emergencies are defined as pain, swelling, abnormal bleeding, or dental trauma. Walkins will receive x-rays if needed. °NOTE: Dental cleaning is not an emergency. °Payment Options: °PAYMENT IS DUE AT THE TIME OF SERVICE. °Minimum co-pay is $40.00 for uninsured patients. °Minimum co-pay is $3.00 for Medicaid with dental coverage. °Dental Insurance is accepted and must be presented at time of visit. °Medicare does not cover dental. °Forms of payment: Cash, credit card, checks. °Best way to get seen: °If not previously registered with the clinic, walk-in dental registration begins at 7:15 am and is on a first come/first serve basis. °If previously registered with the clinic, call to make an appointment. °  °  °[facility] °[phone] °[place] RESIDENTS ONLY °  °Location: °[address], [place], [place] °Clinic Hours: °Mon-Thu 10a-2p °Services: Extractions only! °Payment Options: °FREE (donations accepted) - bring proof of income or support °Best way to get seen: °Call and schedule an appointment OR come at 8am on the 1st Monday of every month (except for holidays) when it is first come/first served. °  °  °[facility] °[phone] °  °Location: °[address], [place] °Clinic Hours: °Friday mornings °Services, Payment Options, Best way to get seen: °Call for info °

## 2019-08-12 NOTE — ED Provider Notes (Signed)
[facility] Emergency Department Provider Note  ____________________________________________  Time seen: Approximately 10:58 PM  I have reviewed the triage vital signs and the nursing notes.   HISTORY  Chief Complaint Dental Pain    HPI MARGET OUTTEN is a 33 y.o. female that presents to the emergency department for evaluation of right bottom dental pain chronic that is worse for the last several days.  Patient states that she has known broken teeth.  Patient states that she has to bite her lip so that the wind does not hit her tooth.  Patient does not have a dentist because she has Medicaid and has not yet found a dentist that takes Medicaid.  No known fevers.   Past Medical History:  Diagnosis Date  . Abscess   . Complication of anesthesia    hr/bp dropped after c/s 2015 in [facility]   had spinal  . GERD (gastroesophageal reflux disease)    sometimes throws up when gas gets in her chest  . Shortness of breath dyspnea     Patient Active Problem List   Diagnosis Date Noted  . History of abnormal cervical Pap smear 10/10/2018  . Abdominal bloating 10/10/2018  . Hidradenitis suppurativa   . Status post laparoscopic cholecystectomy 11/25/2014    Past Surgical History:  Procedure Laterality Date  . ABCESS DRAINAGE     BUTTOCK  . ABDOMINAL SURGERY     tubal, hernia and gallbladder  . CESAREAN SECTION    . CESAREAN SECTION  2012 and 2015  . CHOLECYSTECTOMY    . COLPOSCOPY  2015  . DILATION AND CURETTAGE OF UTERUS    . GALLBLADDER SURGERY  2016  . HERNIA REPAIR    . HYDRADENITIS EXCISION Bilateral 06/05/2016   Procedure: , right axilla;  Surgeon: Jules Husbands, MD;  Location: [facility] ORS;  Service: General;  Laterality: Bilateral;  . INGUINAL HERNIA REPAIR  2016  . TUBAL LIGATION    . TUBAL LIGATION    . TUBAL LIGATION  2016    Prior to Admission medications   Medication Sig Start Date End Date Taking? Authorizing Provider  amoxicillin (AMOXIL) 500  MG capsule Take 1 capsule (500 mg total) by mouth 3 (three) times daily. 08/12/19   Laban Emperor, PA-C  cholestyramine Lucrezia Starch) 4 g packet Take 1 packet (4 g total) by mouth daily. 12/18/18   Virgel Manifold, MD  clindamycin (CLEOCIN) 150 MG capsule Take 2 capsules (300 mg total) by mouth 3 (three) times daily. 01/01/18   Fisher, Linden Dolin, PA-C  famotidine (PEPCID) 20 MG tablet Take 1 tablet (20 mg total) by mouth daily. 01/06/19   Virgel Manifold, MD  lidocaine (XYLOCAINE) 2 % solution Use as directed 10 mLs in the mouth or throat as needed. 08/12/19   Laban Emperor, PA-C  promethazine (PHENERGAN) 12.5 MG tablet Take 1 tablet (12.5 mg total) by mouth every 8 (eight) hours as needed for nausea or vomiting. Patient not taking: Reported on 12/18/2018 10/17/18   Venita Lick, NP    Allergies Bactrim [sulfamethoxazole-trimethoprim]  Family History  Problem Relation Age of Onset  . Hypertension Mother   . Breast cancer Maternal Aunt   . Uterine cancer Maternal Grandmother   . Diabetes Maternal Grandfather   . Breast cancer Maternal Grandfather        35    Social History Social History   Tobacco Use  . Smoking status: Current Every Day Smoker    Packs/day: 0.50  Types: Cigarettes  . Smokeless tobacco: Never Used  Substance Use Topics  . Alcohol use: Yes    Alcohol/week: 6.0 standard drinks    Types: 6 Cans of beer per week    Comment: weekly  . Drug use: No     Review of Systems  Constitutional: No fever/chills Respiratory: No SOB. Gastrointestinal: No abdominal pain.  No nausea, no vomiting.  Musculoskeletal: Negative for musculoskeletal pain. Skin: Negative for rash, abrasions, lacerations, ecchymosis. Neurological: Negative for headaches   ____________________________________________   PHYSICAL EXAM:  VITAL SIGNS: ED Triage Vitals [08/12/19 2238]  Enc Vitals Group     BP 121/75     Pulse Rate 76     Resp 20     Temp 99.1 F (37.3 C)     Temp  Source Oral     SpO2 99 %     Weight 200 lb (90.7 kg)     Height 5\' 1"  (1.549 m)     Head Circumference      Peak Flow      Pain Score 10     Pain Loc      Pain Edu?      Excl. in GC?      Constitutional: Alert and oriented. Well appearing and in no acute distress. Eyes: Conjunctivae are normal. PERRL. EOMI. Head: Atraumatic. ENT:      Ears:      Nose: No congestion/rhinnorhea.      Mouth/Throat: Mucous membranes are moist.  Large cavity to right posterior bottom molar.  No visible swelling. Neck: No stridor. Cardiovascular: Normal rate, regular rhythm.  Good peripheral circulation. Respiratory: Normal respiratory effort without tachypnea or retractions. Lungs CTAB. Good air entry to the bases with no decreased or absent breath sounds. Musculoskeletal: Full range of motion to all extremities. No gross deformities appreciated. Neurologic:  Normal speech and language. No gross focal neurologic deficits are appreciated.  Skin:  Skin is warm, dry and intact. No rash noted. Psychiatric: Mood and affect are normal. Speech and behavior are normal. Patient exhibits appropriate insight and judgement.   ____________________________________________   LABS (all labs ordered are listed, but only abnormal results are displayed)  Labs Reviewed - No data to display ____________________________________________  EKG   ____________________________________________  RADIOLOGY  No results found.  ____________________________________________    PROCEDURES  Procedure(s) performed:    Procedures    Medications  ketorolac (TORADOL) 30 MG/ML injection 30 mg (has no administration in time range)  lidocaine (XYLOCAINE) 2 % viscous mouth solution 15 mL (15 mLs Mouth/Throat Given 08/12/19 2312)  amoxicillin (AMOXIL) capsule 500 mg (500 mg Oral Given 08/12/19 2311)     ____________________________________________   INITIAL IMPRESSION / ASSESSMENT AND PLAN / ED COURSE  Pertinent  labs & imaging results that were available during my care of the patient were reviewed by me and considered in my medical decision making (see chart for details).  Review of the [place] CSRS was performed in accordance of the [facility] prior to dispensing any controlled drugs.   Patient's diagnosis is consistent with dental abscess.  Vital signs and exam are reassuring.  Patient will be discharged home with prescriptions for amoxicillin.  Patient is to follow up with dentist as directed.  Resources were given.  Patient is given ED precautions to return to the ED for any worsening or new symptoms.  Arbie Cookeyasheena L Ritchey was evaluated in Emergency Department on 08/12/2019 for the symptoms described in the history of present illness. She was evaluated in  the context of the global COVID-19 pandemic, which necessitated consideration that the patient might be at risk for infection with the SARS-CoV-2 virus that causes COVID-19. Institutional protocols and algorithms that pertain to the evaluation of patients at risk for COVID-19 are in a state of rapid change based on information released by regulatory bodies including the CDC and federal and state organizations. These policies and algorithms were followed during the patient's care in the ED.   ____________________________________________  FINAL CLINICAL IMPRESSION(S) / ED DIAGNOSES  Final diagnoses:  Dental abscess      NEW MEDICATIONS STARTED DURING THIS VISIT:  ED Discharge Orders         Ordered    amoxicillin (AMOXIL) 500 MG capsule  3 times daily     08/12/19 2325    lidocaine (XYLOCAINE) 2 % solution  As needed     08/12/19 2325              This chart was dictated using voice recognition software/Dragon. Despite best efforts to proofread, errors can occur which can change the meaning. Any change was purely unintentional.    Enid Derry, PA-C 08/12/19 2331    Dionne Bucy, MD 08/16/19 [phone]

## 2019-08-12 NOTE — ED Triage Notes (Signed)
Pt in with co toothache to left side for a week, states has tried to make dentist apptm without success.

## 2019-10-14 ENCOUNTER — Encounter: Payer: Medicaid Other | Admitting: Nurse Practitioner

## 2019-11-03 ENCOUNTER — Encounter: Payer: Self-pay | Admitting: Nurse Practitioner

## 2019-11-03 ENCOUNTER — Other Ambulatory Visit: Payer: Self-pay

## 2019-11-03 ENCOUNTER — Ambulatory Visit: Payer: Medicaid Other | Admitting: Nurse Practitioner

## 2019-11-03 VITALS — BP 106/65 | HR 82 | Temp 98.6°F | Ht 61.42 in | Wt 195.8 lb

## 2019-11-03 DIAGNOSIS — L732 Hidradenitis suppurativa: Secondary | ICD-10-CM | POA: Diagnosis not present

## 2019-11-03 DIAGNOSIS — H539 Unspecified visual disturbance: Secondary | ICD-10-CM | POA: Diagnosis not present

## 2019-11-03 DIAGNOSIS — Z114 Encounter for screening for human immunodeficiency virus [HIV]: Secondary | ICD-10-CM | POA: Diagnosis not present

## 2019-11-03 DIAGNOSIS — E66812 Obesity, class 2: Secondary | ICD-10-CM

## 2019-11-03 DIAGNOSIS — Z0000 Encounter for general adult medical examination without abnormal findings: Secondary | ICD-10-CM | POA: Diagnosis not present

## 2019-11-03 DIAGNOSIS — Z6836 Body mass index (BMI) 36.0-36.9, adult: Secondary | ICD-10-CM | POA: Diagnosis not present

## 2019-11-03 DIAGNOSIS — E6609 Other obesity due to excess calories: Secondary | ICD-10-CM | POA: Diagnosis not present

## 2019-11-03 DIAGNOSIS — E669 Obesity, unspecified: Secondary | ICD-10-CM | POA: Insufficient documentation

## 2019-11-03 NOTE — Progress Notes (Signed)
BP 106/65 (BP Location: Right Arm, Patient Position: Sitting, Cuff Size: Normal)   Pulse 82   Temp 98.6 F (37 C) (Oral)   Ht 5' 1.42" (1.56 m)   Wt 195 lb 12.8 oz (88.8 kg)   LMP 10/09/2019   SpO2 96%   BMI 36.50 kg/m    Subjective:    Patient ID: Samantha Orr, female    DOB: 12-Oct-1986, 34 y.o.   MRN: [id number]  HPI: Samantha Orr is a 34 y.o. female presenting on 11/03/2019 for comprehensive medical examination. Current medical complaints include:none  She currently lives with: children Menopausal Symptoms: no   HIDRADENITIS: Has been seen by dermatology in past, 2008 in [place].  Has been treated in past with abx and surgery.  Has been "pretty much been getting surgeries since age 44".  Uses home remedies, Epson bath and "warm rags in microwave".  Continues to have flares under arms and in groin area, does have a little knot under left axilla and she denies drainage.  Does report some discomfort to the area of knot.  Previously has seen Dr. Everlene Farrier for this and is requesting to return to him.  Reports she does not do well with abx in pill form, so often does not take when prescribed. Discussed possible liquid options.  REFERRAL TO OPTHALMOLOGY --- Reports changes in vision and need for new eyeglasses.   Depression Screen done today and results listed below:  Depression screen [facility] 2/9 11/03/2019 11/03/2019  Decreased Interest 0 0  Down, Depressed, Hopeless 0 0  PHQ - 2 Score 0 0  Altered sleeping 0 0  Tired, decreased energy 0 0  Change in appetite 0 0  Feeling bad or failure about yourself  0 0  Trouble concentrating 0 0  Moving slowly or fidgety/restless 0 0  Suicidal thoughts 0 0  PHQ-9 Score 0 0  Difficult doing work/chores - Not difficult at all    The patient does not have a history of falls. I did not complete a risk assessment for falls. A plan of care for falls was not documented.   Past Medical History:  Past Medical History:  Diagnosis Date  . Abscess     . Complication of anesthesia    hr/bp dropped after c/s 2015 in [facility]   had spinal  . GERD (gastroesophageal reflux disease)    sometimes throws up when gas gets in her chest  . Shortness of breath dyspnea     Surgical History:  Past Surgical History:  Procedure Laterality Date  . ABCESS DRAINAGE     BUTTOCK  . ABDOMINAL SURGERY     tubal, hernia and gallbladder  . CESAREAN SECTION    . CESAREAN SECTION  2012 and 2015  . CHOLECYSTECTOMY    . COLPOSCOPY  2015  . DILATION AND CURETTAGE OF UTERUS    . GALLBLADDER SURGERY  2016  . HERNIA REPAIR    . HYDRADENITIS EXCISION Bilateral 06/05/2016   Procedure: , right axilla;  Surgeon: Leafy Ro, MD;  Location: [facility] ORS;  Service: General;  Laterality: Bilateral;  . INGUINAL HERNIA REPAIR  2016  . TUBAL LIGATION    . TUBAL LIGATION    . TUBAL LIGATION  2016    Medications:  Current Outpatient Medications on File Prior to Visit  Medication Sig  . amoxicillin (AMOXIL) 500 MG capsule Take 1 capsule (500 mg total) by mouth 3 (three) times daily.  . cholestyramine (QUESTRAN) 4 g packet Take 1 packet (  4 g total) by mouth daily.  . clindamycin (CLEOCIN) 150 MG capsule Take 2 capsules (300 mg total) by mouth 3 (three) times daily. (Patient not taking: Reported on 11/03/2019)  . famotidine (PEPCID) 20 MG tablet Take 1 tablet (20 mg total) by mouth daily. (Patient not taking: Reported on 11/03/2019)  . lidocaine (XYLOCAINE) 2 % solution Use as directed 10 mLs in the mouth or throat as needed. (Patient not taking: Reported on 11/03/2019)  . promethazine (PHENERGAN) 12.5 MG tablet Take 1 tablet (12.5 mg total) by mouth every 8 (eight) hours as needed for nausea or vomiting. (Patient not taking: Reported on 12/18/2018)   No current facility-administered medications on file prior to visit.    Allergies:  Allergies  Allergen Reactions  . Bactrim [Sulfamethoxazole-Trimethoprim] Hives    Social History:  Social History   Socioeconomic History   . Marital status: Single    Spouse name: Not on file  . Number of children: Not on file  . Years of education: Not on file  . Highest education level: Not on file  Occupational History  . Not on file  Tobacco Use  . Smoking status: Current Every Day Smoker    Packs/day: 0.50    Types: Cigarettes  . Smokeless tobacco: Never Used  Substance and Sexual Activity  . Alcohol use: Yes    Alcohol/week: 6.0 standard drinks    Types: 6 Cans of beer per week    Comment: weekly  . Drug use: No  . Sexual activity: Yes    Birth control/protection: Surgical    Comment: s/p tubal ligation  Other Topics Concern  . Not on file  Social History Narrative  . Not on file   Social Determinants of Health   Financial Resource Strain:   . Difficulty of Paying Living Expenses: Not on file  Food Insecurity:   . Worried About Programme researcher, broadcasting/film/video in the Last Year: Not on file  . Ran Out of Food in the Last Year: Not on file  Transportation Needs:   . Lack of Transportation (Medical): Not on file  . Lack of Transportation (Non-Medical): Not on file  Physical Activity:   . Days of Exercise per Week: Not on file  . Minutes of Exercise per Session: Not on file  Stress:   . Feeling of Stress : Not on file  Social Connections:   . Frequency of Communication with Friends and Family: Not on file  . Frequency of Social Gatherings with Friends and Family: Not on file  . Attends Religious Services: Not on file  . Active Member of Clubs or Organizations: Not on file  . Attends Banker Meetings: Not on file  . Marital Status: Not on file  Intimate Partner Violence:   . Fear of Current or Ex-Partner: Not on file  . Emotionally Abused: Not on file  . Physically Abused: Not on file  . Sexually Abused: Not on file   Social History   Tobacco Use  Smoking Status Current Every Day Smoker  . Packs/day: 0.50  . Types: Cigarettes  Smokeless Tobacco Never Used   Social History   Substance  and Sexual Activity  Alcohol Use Yes  . Alcohol/week: 6.0 standard drinks  . Types: 6 Cans of beer per week   Comment: weekly    Family History:  Family History  Problem Relation Age of Onset  . Hypertension Mother   . Breast cancer Maternal Aunt   . Uterine cancer Maternal Grandmother   .  Diabetes Maternal Grandfather   . Breast cancer Maternal Grandfather        2358    Past medical history, surgical history, medications, allergies, family history and social history reviewed with patient today and changes made to appropriate areas of the chart.   Review of Systems - negative All other ROS negative except what is listed above and in the HPI.      Objective:    BP 106/65 (BP Location: Right Arm, Patient Position: Sitting, Cuff Size: Normal)   Pulse 82   Temp 98.6 F (37 C) (Oral)   Ht 5' 1.42" (1.56 m)   Wt 195 lb 12.8 oz (88.8 kg)   LMP 10/09/2019   SpO2 96%   BMI 36.50 kg/m   Wt Readings from Last 3 Encounters:  11/03/19 195 lb 12.8 oz (88.8 kg)  08/12/19 200 lb (90.7 kg)  12/18/18 205 lb 6.4 oz (93.2 kg)    Physical Exam Constitutional:      General: She is awake. She is not in acute distress.    Appearance: She is well-developed. She is not ill-appearing.  HENT:     Head: Normocephalic and atraumatic.     Right Ear: Hearing, tympanic membrane, ear canal and external ear normal. No drainage.     Left Ear: Hearing, tympanic membrane, ear canal and external ear normal. No drainage.     Nose: Nose normal.     Right Sinus: No maxillary sinus tenderness or frontal sinus tenderness.     Left Sinus: No maxillary sinus tenderness or frontal sinus tenderness.     Mouth/Throat:     Mouth: Mucous membranes are moist.     Pharynx: Oropharynx is clear. Uvula midline. No pharyngeal swelling, oropharyngeal exudate or posterior oropharyngeal erythema.  Eyes:     General: Lids are normal.        Right eye: No discharge.        Left eye: No discharge.     Extraocular  Movements: Extraocular movements intact.     Conjunctiva/sclera: Conjunctivae normal.     Pupils: Pupils are equal, round, and reactive to light.     Visual Fields: Right eye visual fields normal and left eye visual fields normal.  Neck:     Thyroid: No thyromegaly.     Vascular: No carotid bruit.     Trachea: Trachea normal.  Cardiovascular:     Rate and Rhythm: Normal rate and regular rhythm.     Heart sounds: Normal heart sounds. No murmur. No gallop.   Pulmonary:     Effort: Pulmonary effort is normal. No accessory muscle usage or respiratory distress.     Breath sounds: Normal breath sounds.  Chest:     Breasts:        Right: Normal.        Left: Normal.  Abdominal:     General: Bowel sounds are normal.     Palpations: Abdomen is soft. There is no hepatomegaly or splenomegaly.     Tenderness: There is no abdominal tenderness.  Musculoskeletal:        General: Normal range of motion.     Cervical back: Normal range of motion and neck supple.     Right lower leg: No edema.     Left lower leg: No edema.  Lymphadenopathy:     Head:     Right side of head: No submental, submandibular, tonsillar, preauricular or posterior auricular adenopathy.     Left side of head: No submental, submandibular,  tonsillar, preauricular or posterior auricular adenopathy.     Cervical: No cervical adenopathy.     Upper Body:     Right upper body: No supraclavicular, axillary or pectoral adenopathy.     Left upper body: No supraclavicular, axillary or pectoral adenopathy.  Skin:    General: Skin is warm and dry.     Capillary Refill: Capillary refill takes less than 2 seconds.     Findings: No rash.     Comments: Small, approx size of walnut, firm mass noted under left axilla.  Mild tenderness to touch.  No warmth or erythema.  Skin intact with no drainage.  Neurological:     Mental Status: She is alert and oriented to person, place, and time.     Cranial Nerves: Cranial nerves are intact.      Gait: Gait is intact.     Deep Tendon Reflexes: Reflexes are normal and symmetric.     Reflex Scores:      Brachioradialis reflexes are 2+ on the right side and 2+ on the left side.      Patellar reflexes are 2+ on the right side and 2+ on the left side. Psychiatric:        Attention and Perception: Attention normal.        Mood and Affect: Mood normal.        Speech: Speech normal.        Behavior: Behavior normal. Behavior is cooperative.        Thought Content: Thought content normal.        Judgment: Judgment normal.     Results for orders placed or performed in visit on 10/17/18  CBC With Differential/Platelet  Result Value Ref Range   WBC 4.1 3.4 - 10.8 x10E3/uL   RBC 5.56 (H) 3.77 - 5.28 x10E6/uL   Hemoglobin 16.2 (H) 11.1 - 15.9 g/dL   Hematocrit 54.0 (H) 08.6 - 46.6 %   MCV 87 79 - 97 fL   MCH 29.1 26.6 - 33.0 pg   MCHC 33.5 31.5 - 35.7 g/dL   RDW 76.1 95.0 - 93.2 %   Platelets 194 150 - 450 x10E3/uL   Neutrophils 51 Not Estab. %   Lymphs 35 Not Estab. %   MID 16 Not Estab. %   Neutrophils Absolute 2.1 1.4 - 7.0 x10E3/uL   Lymphocytes Absolute 1.4 0.7 - 3.1 x10E3/uL   MID (Absolute) 0.7 0.1 - 1.6 X10E3/uL  Comprehensive metabolic panel  Result Value Ref Range   Glucose 106 (H) 65 - 99 mg/dL   BUN 5 (L) 6 - 20 mg/dL   Creatinine, Ser 6.71 0.57 - 1.00 mg/dL   GFR calc non Af Amer 106 >59 mL/min/1.73   GFR calc Af Amer 122 >59 mL/min/1.73   BUN/Creatinine Ratio 7 (L) 9 - 23   Sodium 140 134 - 144 mmol/L   Potassium 3.6 3.5 - 5.2 mmol/L   Chloride 102 96 - 106 mmol/L   CO2 15 (L) 20 - 29 mmol/L   Calcium 8.8 8.7 - 10.2 mg/dL   Total Protein 7.4 6.0 - 8.5 g/dL   Albumin 4.2 3.5 - 5.5 g/dL   Globulin, Total 3.2 1.5 - 4.5 g/dL   Albumin/Globulin Ratio 1.3 1.2 - 2.2   Bilirubin Total <0.2 0.0 - 1.2 mg/dL   Alkaline Phosphatase 78 39 - 117 IU/L   AST 29 0 - 40 IU/L   ALT 31 0 - 32 IU/L  Veritor Flu A/B Waived  Result Value Ref  Range   Influenza A Negative  Negative   Influenza B Negative Negative      Assessment & Plan:   Problem List Items Addressed This Visit      Musculoskeletal and Integument   Hidradenitis suppurativa    Chronic, ongoing with new area under left axilla.  Area closed at this time.  Will refer to general surgery, Dr. Dahlia Byes, who has treated patient before for further assessment and treatment.  Return to office for worsening or ongoing issues.      Relevant Orders   Ambulatory referral to General Surgery     Other   Obesity    Recommend continued focus on healthy diet choices and regular physical activity (30 minutes 5 days a week).        Other Visit Diagnoses    Annual physical exam    -  Primary   Annual labs to include TSH, CBC, CMP, Lipid   Relevant Orders   HIV Antibody (routine testing w rflx)   Comprehensive metabolic panel   CBC with Differential/Platelet   TSH   Lipid Panel w/o Chol/HDL Ratio   Changes in vision       Referral to OPTHALMOLOGY   Relevant Orders   Ambulatory referral to Ophthalmology   Encounter for screening for HIV       Obtain HIV lab   Relevant Orders   HIV Antibody (routine testing w rflx)       Follow up plan: Return in about 1 year (around 11/02/2020) for Annual physical.   LABORATORY TESTING:  - Pap smear: pap done, done at [facility] January 2020  IMMUNIZATIONS:   - Tdap: Tetanus vaccination status reviewed: last tetanus booster within 10 years. - Influenza: Up to date - Pneumovax: Not applicable - Prevnar: Not applicable - HPV: Not applicable - Zostavax vaccine: Not applicable  SCREENING: -Mammogram: Up to date , done January 2020 - Colonoscopy: Not applicable  - Bone Density: Not applicable  -Hearing Test: Not applicable  -Spirometry: Not applicable   PATIENT COUNSELING:   Advised to take 1 mg of folate supplement per day if capable of pregnancy.   Sexuality: Discussed sexually transmitted diseases, partner selection, use of condoms, avoidance  of unintended pregnancy  and contraceptive alternatives.   Advised to avoid cigarette smoking.  I discussed with the patient that most people either abstain from alcohol or drink within safe limits (<=14/week and <=4 drinks/occasion for males, <=7/weeks and <= 3 drinks/occasion for females) and that the risk for alcohol disorders and other health effects rises proportionally with the number of drinks per week and how often a drinker exceeds daily limits.  Discussed cessation/primary prevention of drug use and availability of treatment for abuse.   Diet: Encouraged to adjust caloric intake to maintain  or achieve ideal body weight, to reduce intake of dietary saturated fat and total fat, to limit sodium intake by avoiding high sodium foods and not adding table salt, and to maintain adequate dietary potassium and calcium preferably from fresh fruits, vegetables, and low-fat dairy products.    stressed the importance of regular exercise  Injury prevention: Discussed safety belts, safety helmets, smoke detector, smoking near bedding or upholstery.   Dental health: Discussed importance of regular tooth brushing, flossing, and dental visits.    NEXT PREVENTATIVE PHYSICAL DUE IN 1 YEAR. Return in about 1 year (around 11/02/2020) for Annual physical.

## 2019-11-03 NOTE — Assessment & Plan Note (Signed)
Recommend continued focus on healthy diet choices and regular physical activity (30 minutes 5 days a week).  

## 2019-11-03 NOTE — Patient Instructions (Signed)
Healthy Eating Following a healthy eating pattern may help you to achieve and maintain a healthy body weight, reduce the risk of chronic disease, and live a long and productive life. It is important to follow a healthy eating pattern at an appropriate calorie level for your body. Your nutritional needs should be met primarily through food by choosing a variety of nutrient-rich foods. What are tips for following this plan? Reading food labels  Read labels and choose the following: ? Reduced or low sodium. ? Juices with 100% fruit juice. ? Foods with low saturated fats and high polyunsaturated and monounsaturated fats. ? Foods with whole grains, such as whole wheat, cracked wheat, brown rice, and wild rice. ? Whole grains that are fortified with folic acid. This is recommended for women who are pregnant or who want to become pregnant.  Read labels and avoid the following: ? Foods with a lot of added sugars. These include foods that contain brown sugar, corn sweetener, corn syrup, dextrose, fructose, glucose, high-fructose corn syrup, honey, invert sugar, lactose, malt syrup, maltose, molasses, raw sugar, sucrose, trehalose, or turbinado sugar.  Do not eat more than the following amounts of added sugar per day:  6 teaspoons (25 g) for women.  9 teaspoons (38 g) for men. ? Foods that contain processed or refined starches and grains. ? Refined grain products, such as white flour, degermed cornmeal, white bread, and white rice. Shopping  Choose nutrient-rich snacks, such as vegetables, whole fruits, and nuts. Avoid high-calorie and high-sugar snacks, such as potato chips, fruit snacks, and candy.  Use oil-based dressings and spreads on foods instead of solid fats such as butter, stick margarine, or cream cheese.  Limit pre-made sauces, mixes, and "instant" products such as flavored rice, instant noodles, and ready-made pasta.  Try more plant-protein sources, such as tofu, tempeh, black beans,  edamame, lentils, nuts, and seeds.  Explore eating plans such as the Mediterranean diet or vegetarian diet. Cooking  Use oil to saut or stir-fry foods instead of solid fats such as butter, stick margarine, or lard.  Try baking, boiling, grilling, or broiling instead of frying.  Remove the fatty part of meats before cooking.  Steam vegetables in water or broth. Meal planning   At meals, imagine dividing your plate into fourths: ? One-half of your plate is fruits and vegetables. ? One-fourth of your plate is whole grains. ? One-fourth of your plate is protein, especially lean meats, poultry, eggs, tofu, beans, or nuts.  Include low-fat dairy as part of your daily diet. Lifestyle  Choose healthy options in all settings, including home, work, school, restaurants, or stores.  Prepare your food safely: ? Wash your hands after handling raw meats. ? Keep food preparation surfaces clean by regularly washing with hot, soapy water. ? Keep raw meats separate from ready-to-eat foods, such as fruits and vegetables. ? Cook seafood, meat, poultry, and eggs to the recommended internal temperature. ? Store foods at safe temperatures. In general:  Keep cold foods at 80F (4.4C) or below.  Keep hot foods at 180F (60C) or above.  Keep your freezer at [facility] (-17.8C) or below.  Foods are no longer safe to eat when they have been between the temperatures of 40-180F (4.4-60C) for more than 2 hours. What foods should I eat? Fruits Aim to eat 2 cup-equivalents of fresh, canned (in natural juice), or frozen fruits each day. Examples of 1 cup-equivalent of fruit include 1 small apple, 8 large strawberries, 1 cup canned fruit,  cup  dried fruit, or 1 cup 100% juice. Vegetables Aim to eat 2-3 cup-equivalents of fresh and frozen vegetables each day, including different varieties and colors. Examples of 1 cup-equivalent of vegetables include 2 medium carrots, 2 cups raw, leafy greens, 1 cup chopped  vegetable (raw or cooked), or 1 medium baked potato. Grains Aim to eat 6 ounce-equivalents of whole grains each day. Examples of 1 ounce-equivalent of grains include 1 slice of bread, 1 cup ready-to-eat cereal, 3 cups popcorn, or  cup cooked rice, pasta, or cereal. Meats and other proteins Aim to eat 5-6 ounce-equivalents of protein each day. Examples of 1 ounce-equivalent of protein include 1 egg, 1/2 cup nuts or seeds, or 1 tablespoon (16 g) peanut butter. A cut of meat or fish that is the size of a deck of cards is about 3-4 ounce-equivalents.  Of the protein you eat each week, try to have at least 8 ounces come from seafood. This includes salmon, trout, herring, and anchovies. Dairy Aim to eat 3 cup-equivalents of fat-free or low-fat dairy each day. Examples of 1 cup-equivalent of dairy include 1 cup (240 mL) milk, 8 ounces (250 g) yogurt, 1 ounces (44 g) natural cheese, or 1 cup (240 mL) fortified soy milk. Fats and oils  Aim for about 5 teaspoons (21 g) per day. Choose monounsaturated fats, such as canola and olive oils, avocados, peanut butter, and most nuts, or polyunsaturated fats, such as sunflower, corn, and soybean oils, walnuts, pine nuts, sesame seeds, sunflower seeds, and flaxseed. Beverages  Aim for six 8-oz glasses of water per day. Limit coffee to three to five 8-oz cups per day.  Limit caffeinated beverages that have added calories, such as soda and energy drinks.  Limit alcohol intake to no more than 1 drink a day for nonpregnant women and 2 drinks a day for men. One drink equals 12 oz of beer (355 mL), 5 oz of wine (148 mL), or 1 oz of hard liquor (44 mL). Seasoning and other foods  Avoid adding excess amounts of salt to your foods. Try flavoring foods with herbs and spices instead of salt.  Avoid adding sugar to foods.  Try using oil-based dressings, sauces, and spreads instead of solid fats. This information is based on general [place] nutrition guidelines. For more  information, visit [employer]. Exact amounts may vary based on your nutrition needs. Summary  A healthy eating plan may help you to maintain a healthy weight, reduce the risk of chronic diseases, and stay active throughout your life.  Plan your meals. Make sure you eat the right portions of a variety of nutrient-rich foods.  Try baking, boiling, grilling, or broiling instead of frying.  Choose healthy options in all settings, including home, work, school, restaurants, or stores. This information is not intended to replace advice given to you by your health care provider. Make sure you discuss any questions you have with your health care provider. Document Revised: 01/13/2018 Document Reviewed: 01/13/2018 Elsevier Patient Education  [place].

## 2019-11-03 NOTE — Assessment & Plan Note (Signed)
Chronic, ongoing with new area under left axilla.  Area closed at this time.  Will refer to general surgery, Dr. Everlene Farrier, who has treated patient before for further assessment and treatment.  Return to office for worsening or ongoing issues.

## 2019-11-04 LAB — COMPREHENSIVE METABOLIC PANEL WITH GFR
ALT: 25 IU/L (ref 0–32)
AST: 18 IU/L (ref 0–40)
Albumin/Globulin Ratio: 1.4 (ref 1.2–2.2)
Albumin: 3.7 g/dL — ABNORMAL LOW (ref 3.8–4.8)
Alkaline Phosphatase: 86 IU/L (ref 39–117)
BUN/Creatinine Ratio: 10 (ref 9–23)
BUN: 7 mg/dL (ref 6–20)
Bilirubin Total: <0.2 mg/dL (ref 0.0–1.2)
CO2: 21 mmol/L (ref 20–29)
Calcium: 8.7 mg/dL (ref 8.7–10.2)
Chloride: 106 mmol/L (ref 96–106)
Creatinine, Ser: 0.71 mg/dL (ref 0.57–1.00)
GFR calc Af Amer: 128 mL/min/1.73
GFR calc non Af Amer: 111 mL/min/1.73
Globulin, Total: 2.7 g/dL (ref 1.5–4.5)
Glucose: 85 mg/dL (ref 65–99)
Potassium: 4 mmol/L (ref 3.5–5.2)
Sodium: 138 mmol/L (ref 134–144)
Total Protein: 6.4 g/dL (ref 6.0–8.5)

## 2019-11-04 LAB — CBC WITH DIFFERENTIAL/PLATELET
Basophils Absolute: 0 x10E3/uL (ref 0.0–0.2)
Basos: 1 %
EOS (ABSOLUTE): 0.3 x10E3/uL (ref 0.0–0.4)
Eos: 3 %
Hematocrit: 36.7 % (ref 34.0–46.6)
Hemoglobin: 12.4 g/dL (ref 11.1–15.9)
Immature Grans (Abs): 0 x10E3/uL (ref 0.0–0.1)
Immature Granulocytes: 0 %
Lymphocytes Absolute: 1.9 x10E3/uL (ref 0.7–3.1)
Lymphs: 21 %
MCH: 29 pg (ref 26.6–33.0)
MCHC: 33.8 g/dL (ref 31.5–35.7)
MCV: 86 fL (ref 79–97)
Monocytes Absolute: 0.6 x10E3/uL (ref 0.1–0.9)
Monocytes: 7 %
Neutrophils Absolute: 5.9 x10E3/uL (ref 1.4–7.0)
Neutrophils: 68 %
Platelets: 205 x10E3/uL (ref 150–450)
RBC: 4.27 x10E6/uL (ref 3.77–5.28)
RDW: 12.8 % (ref 11.7–15.4)
WBC: 8.7 x10E3/uL (ref 3.4–10.8)

## 2019-11-04 LAB — TSH: TSH: 1.28 u[IU]/mL (ref 0.450–4.500)

## 2019-11-04 LAB — LIPID PANEL W/O CHOL/HDL RATIO
Cholesterol, Total: 106 mg/dL (ref 100–199)
HDL: 43 mg/dL
LDL Chol Calc (NIH): 50 mg/dL (ref 0–99)
Triglycerides: 56 mg/dL (ref 0–149)
VLDL Cholesterol Cal: 13 mg/dL (ref 5–40)

## 2019-11-04 LAB — HIV ANTIBODY (ROUTINE TESTING W REFLEX): HIV Screen 4th Generation wRfx: NONREACTIVE

## 2019-11-04 NOTE — Progress Notes (Signed)
Normal test results noted.  Please call patient and make them aware of normal results and will continue to monitor at regular visits.  Have a great day.  Look forward to seeing you at your next visit.

## 2019-11-11 ENCOUNTER — Encounter: Payer: Self-pay | Admitting: Surgery

## 2019-11-11 ENCOUNTER — Ambulatory Visit (INDEPENDENT_AMBULATORY_CARE_PROVIDER_SITE_OTHER): Payer: Medicaid Other | Admitting: Surgery

## 2019-11-11 ENCOUNTER — Other Ambulatory Visit: Payer: Self-pay

## 2019-11-11 DIAGNOSIS — N6452 Nipple discharge: Secondary | ICD-10-CM | POA: Diagnosis not present

## 2019-11-11 NOTE — Patient Instructions (Addendum)
We will set you up for a breast MRI and possible a mammogram first. The [facility] will contact you to schedule this.  We will have you follow up here in about 2 weeks to discuss further care.

## 2019-11-12 NOTE — Progress Notes (Signed)
Surgical Consultation  11/12/2019  Samantha Orr is an 34 y.o. female.   Chief Complaint  Patient presents with  . Nausea    Hidradenitis     HPI: This prior is a 34 year old female seen for hidradenitis of the left axilla and nipple discharge in the left side. She does have history known for hidradenitis and I excised her and saw her more than 3-1/2 years ago and she has done well on the right axilla.  She now reports that her left axilla hurts and she reports intermittent sharp pain that is mild to moderate intensity.  No fevers no chills no evidence of an active infection.  For over about a year or so she has also had nipple discharge sometimes white sometimes serous and sometimes a different color.  Does experience some breast discomfort but no masses.  She Did have an ultrasound and mammogram that I personally reviewed showing no evidence of any suspicious lesions. He does continue to smoke. CBC and a CMP was reviewed showing normal values  Past Medical History:  Diagnosis Date  . Abscess   . Complication of anesthesia    hr/bp dropped after c/s 2015 in pacu   had spinal  . GERD (gastroesophageal reflux disease)    sometimes throws up when gas gets in her chest  . Shortness of breath dyspnea     Past Surgical History:  Procedure Laterality Date  . ABCESS DRAINAGE     BUTTOCK  . ABDOMINAL SURGERY     tubal, hernia and gallbladder  . CESAREAN SECTION    . CESAREAN SECTION  2012 and 2015  . CHOLECYSTECTOMY    . COLPOSCOPY  2015  . DILATION AND CURETTAGE OF UTERUS    . GALLBLADDER SURGERY  2016  . HERNIA REPAIR    . HYDRADENITIS EXCISION Bilateral 06/05/2016   Procedure: , right axilla;  Surgeon: Leafy Ro, MD;  Location: [facility] ORS;  Service: General;  Laterality: Bilateral;  . INGUINAL HERNIA REPAIR  2016  . TUBAL LIGATION    . TUBAL LIGATION    . TUBAL LIGATION  2016    Family History  Problem Relation Age of Onset  . Hypertension Mother   . Breast cancer  Maternal Aunt   . Uterine cancer Maternal Grandmother   . Diabetes Maternal Grandfather   . Breast cancer Maternal Grandfather        79    Social History:  reports that she has been smoking cigarettes. She has been smoking about 0.50 packs per day. She has never used smokeless tobacco. She reports current alcohol use of about 6.0 standard drinks of alcohol per week. She reports that she does not use drugs.  Allergies:  Allergies  Allergen Reactions  . Bactrim [Sulfamethoxazole-Trimethoprim] Hives    Medications reviewed.     ROS Full ROS performed and is otherwise negative other than what is stated in the HPI    BP 110/70   Pulse 83   Temp (!) 97.5 F (36.4 C)   Ht 5\' 2"  (1.575 m)   Wt 191 lb (86.6 kg)   LMP 11/09/2019   SpO2 96%   BMI 34.93 kg/m   Physical Exam Vitals and nursing note reviewed. Exam conducted with a chaperone present.  Constitutional:      General: She is not in acute distress.    Appearance: Normal appearance. She is normal weight.  Eyes:     General: No scleral icterus.       Right  eye: No discharge.        Left eye: No discharge.     Extraocular Movements: Extraocular movements intact.  Cardiovascular:     Rate and Rhythm: Normal rate and regular rhythm.  Pulmonary:     Effort: Pulmonary effort is normal. No respiratory distress.     Breath sounds: Normal breath sounds. No stridor. No wheezing.     Comments: BREAST: No evidence of suspicious palpable masses.  Normal skin and nipple.  No evidence of lymphadenopathy.  I was unable to express any discharge from the left side. The left axilla there is evidence of hidradenitis but without abscess there is no evidence of drainage.  It is very tender to palpation Abdominal:     General: Abdomen is flat. There is no distension.     Palpations: There is no mass.     Tenderness: There is no abdominal tenderness. There is no left CVA tenderness, guarding or rebound.     Hernia: No hernia is present.   Musculoskeletal:        General: Normal range of motion.     Cervical back: Normal range of motion and neck supple. No rigidity.  Skin:    General: Skin is warm and dry.     Capillary Refill: Capillary refill takes less than 2 seconds.  Neurological:     General: No focal deficit present.     Mental Status: She is alert and oriented to person, place, and time.  Psychiatric:        Mood and Affect: Mood normal.        Behavior: Behavior normal.        Thought Content: Thought content normal.        Judgment: Judgment normal.     Assessment/Plan: 34 year old female with multiple problems including left persistent nipple discharge.  No mammographic or ultrasonic findings suspicious for any particular pathology.  We will need to order MRI as the next step.  Given that he has labs at least a year we may have to repeat the mammogram and ultrasound depending on our protocol by our breast center.  Regarding the left hidradenitis within the axilla she will probably require excision but I will make sure that she does not have any active breast pathology that warrants surgical intervention.  Extensive counseling provided to the patient and she understands.  We will see her back in a few weeks after she completes the mammogram as well as the MRI.   Caroleen Hamman, MD [facility] General Surgeon

## 2019-11-13 ENCOUNTER — Ambulatory Visit
Admission: RE | Admit: 2019-11-13 | Discharge: 2019-11-13 | Disposition: A | Discharge status: Home / Self Care | Payer: Medicaid Other | Source: Ambulatory Visit | Attending: Surgery | Admitting: Surgery

## 2019-11-13 DIAGNOSIS — R928 Other abnormal and inconclusive findings on diagnostic imaging of breast: Secondary | ICD-10-CM | POA: Diagnosis not present

## 2019-11-13 DIAGNOSIS — N6489 Other specified disorders of breast: Secondary | ICD-10-CM | POA: Diagnosis not present

## 2019-11-13 DIAGNOSIS — N6452 Nipple discharge: Secondary | ICD-10-CM

## 2019-11-16 ENCOUNTER — Telehealth: Payer: Self-pay | Admitting: Emergency Medicine

## 2019-11-16 NOTE — Telephone Encounter (Signed)
-----   Message from Leafy Ro, MD sent at 11/16/2019  8:56 AM EST ----- Please let the pt know mammo is nml. She still needs bilateral MRI before she sees me back. ----- Message ----- From: Interface, Rad Results In Sent: 11/13/2019   5:50 PM EST To: Leafy Ro, MD

## 2019-11-16 NOTE — Telephone Encounter (Signed)
Pt made aware. Verbalized understanding. No questions or concerns at this time.

## 2019-11-27 ENCOUNTER — Ambulatory Visit
Admission: RE | Admit: 2019-11-27 | Discharge: 2019-11-27 | Disposition: A | Discharge status: Home / Self Care | Payer: Medicaid Other | Source: Ambulatory Visit | Attending: Surgery | Admitting: Surgery

## 2019-11-27 ENCOUNTER — Other Ambulatory Visit: Payer: Self-pay

## 2019-11-27 ENCOUNTER — Encounter: Payer: Self-pay | Admitting: Radiology

## 2019-11-27 DIAGNOSIS — N6311 Unspecified lump in the right breast, upper outer quadrant: Secondary | ICD-10-CM | POA: Diagnosis not present

## 2019-11-27 DIAGNOSIS — N6452 Nipple discharge: Secondary | ICD-10-CM | POA: Diagnosis not present

## 2019-11-27 DIAGNOSIS — N6321 Unspecified lump in the left breast, upper outer quadrant: Secondary | ICD-10-CM | POA: Diagnosis not present

## 2019-11-27 MED ORDER — GADOBUTROL 1 MMOL/ML IV SOLN
7.5000 mL | Freq: Once | INTRAVENOUS | Status: AC | PRN
  Administered 2019-11-27: 7.5 mL via INTRAVENOUS

## 2019-11-30 ENCOUNTER — Encounter: Payer: Self-pay | Admitting: Surgery

## 2019-11-30 ENCOUNTER — Telehealth: Payer: Self-pay | Admitting: Surgery

## 2019-11-30 ENCOUNTER — Ambulatory Visit: Payer: Medicaid Other | Admitting: Surgery

## 2019-11-30 ENCOUNTER — Other Ambulatory Visit: Payer: Self-pay

## 2019-11-30 ENCOUNTER — Ambulatory Visit (INDEPENDENT_AMBULATORY_CARE_PROVIDER_SITE_OTHER): Payer: Medicaid Other | Admitting: Surgery

## 2019-11-30 VITALS — BP 119/82 | HR 89 | Temp 97.5°F | Resp 12 | Ht 62.0 in | Wt 188.4 lb

## 2019-11-30 DIAGNOSIS — L732 Hidradenitis suppurativa: Secondary | ICD-10-CM

## 2019-11-30 DIAGNOSIS — N6452 Nipple discharge: Secondary | ICD-10-CM

## 2019-11-30 NOTE — Patient Instructions (Signed)
We will schedule you to have a MRI guided biopsy done for both Left and Right Breast and an Ultrasound of the Left Breast done. This will be done in [place] and it will be completed on different days.  Our office will contact you with the appointment dates.    Our Surgery scheduler will contact you to schedule your surgery.   Please have the [facility] Sheet available when she calls you.   Please contact the office if you have any questions or concerns.

## 2019-11-30 NOTE — Telephone Encounter (Signed)
Pt has been advised of pre admission date/time, Covid Testing date and Surgery date.  Surgery Date: 12/03/19 Preadmission Testing Date: 2 hrs prior to sx arrival time Covid Testing Date: 12/01/19 - patient [facility] to [facility] ([address] [facility])  Patient has been made aware to call [phone], between 1-3:00pm the day before surgery, to find out what time to arrive.

## 2019-11-30 NOTE — Progress Notes (Signed)
Outpatient Surgical Follow Up  11/30/2019  Samantha Orr is an 34 y.o. female.   Chief Complaint  Patient presents with  . Follow-up    hidradenitis suppurativa left axillary and left nipple d/c, MRI Breast 11/27/19    HPI: Samantha Orr is a 34 year old female well-known to me with history of recurrent hidradenitis.  Her main issue is hidradenitis in the left axilla.  Now it is getting bigger and having more pain.  No discharge.  No fevers no chills.  MRI personally reviewed showing evidence of 2 breast abnormalities on the left side and another masslike clonality on the right side.  She will need biopsies of all these 3 lesions 2 on the left side and one on the right side.  There is also evidence of lymphadenopathy this is likely reactive to the hidradenitis of the axilla.  Past Medical History:  Diagnosis Date  . Abscess   . Complication of anesthesia    hr/bp dropped after c/s 2015 in pacu   had spinal  . GERD (gastroesophageal reflux disease)    sometimes throws up when gas gets in her chest  . Shortness of breath dyspnea     Past Surgical History:  Procedure Laterality Date  . ABCESS DRAINAGE     BUTTOCK  . ABDOMINAL SURGERY     tubal, hernia and gallbladder  . CESAREAN SECTION    . CESAREAN SECTION  2012 and 2015  . CHOLECYSTECTOMY    . COLPOSCOPY  2015  . DILATION AND CURETTAGE OF UTERUS    . GALLBLADDER SURGERY  2016  . HERNIA REPAIR    . HYDRADENITIS EXCISION Bilateral 06/05/2016   Procedure: , right axilla;  Surgeon: Jules Husbands, MD;  Location: [facility] ORS;  Service: General;  Laterality: Bilateral;  . INGUINAL HERNIA REPAIR  2016  . TUBAL LIGATION    . TUBAL LIGATION    . TUBAL LIGATION  2016    Family History  Problem Relation Age of Onset  . Hypertension Mother   . Breast cancer Maternal Aunt   . Uterine cancer Maternal Grandmother   . Diabetes Maternal Grandfather   . Breast cancer Maternal Grandfather        21    Social History:  reports that she has  been smoking cigarettes. She has been smoking about 0.50 packs per day. She has never used smokeless tobacco. She reports current alcohol use of about 6.0 standard drinks of alcohol per week. She reports that she does not use drugs.  Allergies:  Allergies  Allergen Reactions  . Bactrim [Sulfamethoxazole-Trimethoprim] Hives    Medications reviewed.    ROS Full ROS performed and is otherwise negative other than what is stated in HPI   BP 119/82   Pulse 89   Temp (!) 97.5 F (36.4 C)   Resp 12   Ht 5\' 2"  (1.575 m)   Wt 188 lb 6.4 oz (85.5 kg)   LMP 11/09/2019   SpO2 97%   BMI 34.46 kg/m   Physical Exam Vitals and nursing note reviewed. Exam conducted with a chaperone present.  Constitutional:      General: She is not in acute distress.    Appearance: Normal appearance. She is normal weight.  Eyes:     General: No scleral icterus.       Right eye: No discharge.        Left eye: No discharge.  Cardiovascular:     Rate and Rhythm: Normal rate and regular rhythm.  Pulses: Normal pulses.     Heart sounds: Normal heart sounds. No murmur.  Pulmonary:     Effort: Pulmonary effort is normal. No respiratory distress.     Breath sounds: Normal breath sounds. No stridor. No wheezing.     Comments: BREAST: No palpable masses.  Axillary hidradenitis on the left side tender to palpation.  No evidence of necrotizing infection. Abdominal:     General: Abdomen is flat. There is no distension.     Palpations: There is no mass.     Tenderness: There is no abdominal tenderness. There is no rebound.     Hernia: No hernia is present.  Musculoskeletal:     Cervical back: Normal range of motion and neck supple. No rigidity.  Skin:    General: Skin is warm and dry.  Neurological:     General: No focal deficit present.     Mental Status: She is alert and oriented to person, place, and time.  Psychiatric:        Mood and Affect: Mood normal.        Thought Content: Thought content  normal.        Judgment: Judgment normal.     Assessment/Plan: 34 year old female with worsening hidradenitis of the left axilla in need for excision.  Discussed with the patient in detail about the procedure.  Risk benefit and possible indications she understands.  Given all the breast issues we will also perform biopsy.  Regarding breast nipple discharge and abnormalities on MRI she will need bilateral MRI guided biopsies of lesions and I will have some point time will have to follow-up with an ultrasound of the left axilla.  I do think that the left axillary lymphadenopathy is likely reactive to the active hidradenitis.   Greater than 50% of the 40 minutes  visit was spent in counseling/coordination of care   Sterling Big, MD [facility] General Surgeon

## 2019-11-30 NOTE — H&P (View-Only) (Signed)
Outpatient Surgical Follow Up  11/30/2019  Samantha Orr is an 34 y.o. female.   Chief Complaint  Patient presents with  . Follow-up    hidradenitis suppurativa left axillary and left nipple d/c, MRI Breast 11/27/19    HPI: [name] is a 34-year-old female well-known to me with history of recurrent hidradenitis.  Her main issue is hidradenitis in the left axilla.  Now it is getting bigger and having more pain.  No discharge.  No fevers no chills.  MRI personally reviewed showing evidence of 2 breast abnormalities on the left side and another masslike clonality on the right side.  She will need biopsies of all these 3 lesions 2 on the left side and one on the right side.  There is also evidence of lymphadenopathy this is likely reactive to the hidradenitis of the axilla.  Past Medical History:  Diagnosis Date  . Abscess   . Complication of anesthesia    hr/bp dropped after c/s 2015 in pacu   had spinal  . GERD (gastroesophageal reflux disease)    sometimes throws up when gas gets in her chest  . Shortness of breath dyspnea     Past Surgical History:  Procedure Laterality Date  . ABCESS DRAINAGE     BUTTOCK  . ABDOMINAL SURGERY     tubal, hernia and gallbladder  . CESAREAN SECTION    . CESAREAN SECTION  2012 and 2015  . CHOLECYSTECTOMY    . COLPOSCOPY  2015  . DILATION AND CURETTAGE OF UTERUS    . GALLBLADDER SURGERY  2016  . HERNIA REPAIR    . HYDRADENITIS EXCISION Bilateral 06/05/2016   Procedure: , right axilla;  Surgeon: Diego F Pabon, MD;  Location: [facility] ORS;  Service: General;  Laterality: Bilateral;  . INGUINAL HERNIA REPAIR  2016  . TUBAL LIGATION    . TUBAL LIGATION    . TUBAL LIGATION  2016    Family History  Problem Relation Age of Onset  . Hypertension Mother   . Breast cancer Maternal Aunt   . Uterine cancer Maternal Grandmother   . Diabetes Maternal Grandfather   . Breast cancer Maternal Grandfather        58    Social History:  reports that she has  been smoking cigarettes. She has been smoking about 0.50 packs per day. She has never used smokeless tobacco. She reports current alcohol use of about 6.0 standard drinks of alcohol per week. She reports that she does not use drugs.  Allergies:  Allergies  Allergen Reactions  . Bactrim [Sulfamethoxazole-Trimethoprim] Hives    Medications reviewed.    ROS Full ROS performed and is otherwise negative other than what is stated in HPI   BP 119/82   Pulse 89   Temp (!) 97.5 F (36.4 C)   Resp 12   Ht 5' 2" (1.575 m)   Wt 188 lb 6.4 oz (85.5 kg)   LMP 11/09/2019   SpO2 97%   BMI 34.46 kg/m   Physical Exam Vitals and nursing note reviewed. Exam conducted with a chaperone present.  Constitutional:      General: She is not in acute distress.    Appearance: Normal appearance. She is normal weight.  Eyes:     General: No scleral icterus.       Right eye: No discharge.        Left eye: No discharge.  Cardiovascular:     Rate and Rhythm: Normal rate and regular rhythm.       Pulses: Normal pulses.     Heart sounds: Normal heart sounds. No murmur.  Pulmonary:     Effort: Pulmonary effort is normal. No respiratory distress.     Breath sounds: Normal breath sounds. No stridor. No wheezing.     Comments: BREAST: No palpable masses.  Axillary hidradenitis on the left side tender to palpation.  No evidence of necrotizing infection. Abdominal:     General: Abdomen is flat. There is no distension.     Palpations: There is no mass.     Tenderness: There is no abdominal tenderness. There is no rebound.     Hernia: No hernia is present.  Musculoskeletal:     Cervical back: Normal range of motion and neck supple. No rigidity.  Skin:    General: Skin is warm and dry.  Neurological:     General: No focal deficit present.     Mental Status: She is alert and oriented to person, place, and time.  Psychiatric:        Mood and Affect: Mood normal.        Thought Content: Thought content  normal.        Judgment: Judgment normal.     Assessment/Plan: 34 year old female with worsening hidradenitis of the left axilla in need for excision.  Discussed with the patient in detail about the procedure.  Risk benefit and possible indications she understands.  Given all the breast issues we will also perform biopsy.  Regarding breast nipple discharge and abnormalities on MRI she will need bilateral MRI guided biopsies of lesions and I will have some point time will have to follow-up with an ultrasound of the left axilla.  I do think that the left axillary lymphadenopathy is likely reactive to the active hidradenitis.   Greater than 50% of the 40 minutes  visit was spent in counseling/coordination of care   Sterling Big, MD [facility] General Surgeon

## 2019-12-01 ENCOUNTER — Telehealth: Payer: Self-pay | Admitting: Emergency Medicine

## 2019-12-01 ENCOUNTER — Other Ambulatory Visit: Payer: Self-pay

## 2019-12-01 ENCOUNTER — Other Ambulatory Visit: Payer: Self-pay | Admitting: Surgery

## 2019-12-01 ENCOUNTER — Other Ambulatory Visit
Admission: RE | Admit: 2019-12-01 | Discharge: 2019-12-01 | Disposition: A | Discharge status: Home / Self Care | Payer: Medicaid Other | Source: Ambulatory Visit | Attending: Surgery | Admitting: Surgery

## 2019-12-01 ENCOUNTER — Other Ambulatory Visit: Payer: Self-pay | Admitting: Emergency Medicine

## 2019-12-01 DIAGNOSIS — N6320 Unspecified lump in the left breast, unspecified quadrant: Secondary | ICD-10-CM

## 2019-12-01 DIAGNOSIS — N6310 Unspecified lump in the right breast, unspecified quadrant: Secondary | ICD-10-CM

## 2019-12-01 DIAGNOSIS — Z01812 Encounter for preprocedural laboratory examination: Secondary | ICD-10-CM | POA: Diagnosis not present

## 2019-12-01 DIAGNOSIS — Z20822 Contact with and (suspected) exposure to covid-19: Secondary | ICD-10-CM | POA: Insufficient documentation

## 2019-12-01 DIAGNOSIS — L732 Hidradenitis suppurativa: Secondary | ICD-10-CM

## 2019-12-01 LAB — SARS CORONAVIRUS 2 (TAT 6-24 HRS): SARS Coronavirus 2: NEGATIVE

## 2019-12-01 NOTE — Telephone Encounter (Signed)
Spoke to [facility]) to schedule pt for her breast biopsies and U/S.   Patient is schedule 12/09/19 starting at 7am for a MRI Biopsy of L breast and will have another MRI Biopsy of L breast later that day. Pt is to go to [facility] ([address] [place]) at 7am first then go to the [facility] in [place] ([address] [place]) after.   Patient is schedule 12/10/19 starting at 7am for a MRI Biopsy of R Breast. She is to go to [facility] at 7am first then go to the [facility] in [place] after .   Pt is scheduled for a U/S of L axilla on 12/17/19 at 10:30am at the [facility] in [place].   Called patient at this time and made her aware of following appointments. Pt verbalized understanding. Pt advised to call the office if she has any questions in regards to the above appointments.

## 2019-12-02 MED ORDER — CLINDAMYCIN PHOSPHATE 900 MG/50ML IV SOLN
900.0000 mg | INTRAVENOUS | Status: AC
  Administered 2019-12-03: 900 mg via INTRAVENOUS

## 2019-12-03 ENCOUNTER — Ambulatory Visit: Payer: Medicaid Other | Admitting: Anesthesiology

## 2019-12-03 ENCOUNTER — Other Ambulatory Visit: Payer: Self-pay

## 2019-12-03 ENCOUNTER — Encounter: Payer: Self-pay | Admitting: Surgery

## 2019-12-03 ENCOUNTER — Ambulatory Visit
Admission: RE | Admit: 2019-12-03 | Discharge: 2019-12-03 | Disposition: A | Discharge status: Home / Self Care | Payer: Medicaid Other | Attending: Surgery | Admitting: Surgery

## 2019-12-03 ENCOUNTER — Encounter
Admission: RE | Disposition: A | Discharge status: Home / Self Care | Payer: Self-pay | Source: Home / Self Care | Attending: Surgery

## 2019-12-03 DIAGNOSIS — L732 Hidradenitis suppurativa: Secondary | ICD-10-CM | POA: Diagnosis not present

## 2019-12-03 DIAGNOSIS — N6452 Nipple discharge: Secondary | ICD-10-CM

## 2019-12-03 DIAGNOSIS — F17210 Nicotine dependence, cigarettes, uncomplicated: Secondary | ICD-10-CM | POA: Diagnosis not present

## 2019-12-03 DIAGNOSIS — R928 Other abnormal and inconclusive findings on diagnostic imaging of breast: Secondary | ICD-10-CM | POA: Diagnosis not present

## 2019-12-03 DIAGNOSIS — K219 Gastro-esophageal reflux disease without esophagitis: Secondary | ICD-10-CM | POA: Diagnosis not present

## 2019-12-03 HISTORY — PX: HYDRADENITIS EXCISION: SHX5243

## 2019-12-03 LAB — POCT PREGNANCY, URINE: Preg Test, Ur: NEGATIVE

## 2019-12-03 SURGERY — EXCISION, HIDRADENITIS, AXILLA
Anesthesia: General | Site: Axilla | Laterality: Left

## 2019-12-03 MED ORDER — ONDANSETRON HCL 4 MG/2ML IJ SOLN
INTRAMUSCULAR | Status: DC | PRN
  Administered 2019-12-03: 4 mg via INTRAVENOUS

## 2019-12-03 MED ORDER — CHLORHEXIDINE GLUCONATE CLOTH 2 % EX PADS
6.0000 | MEDICATED_PAD | Freq: Once | CUTANEOUS | Status: AC
  Administered 2019-12-03: 11:00:00 6 via TOPICAL

## 2019-12-03 MED ORDER — DEXAMETHASONE SODIUM PHOSPHATE 10 MG/ML IJ SOLN
INTRAMUSCULAR | Status: AC
  Filled 2019-12-03: qty 1

## 2019-12-03 MED ORDER — BUPIVACAINE LIPOSOME 1.3 % IJ SUSP
INTRAMUSCULAR | Status: AC
  Filled 2019-12-03: qty 20

## 2019-12-03 MED ORDER — HYDROCODONE-ACETAMINOPHEN 5-325 MG PO TABS: 1.0000 | ORAL_TABLET | ORAL | 0 refills | Status: DC | PRN

## 2019-12-03 MED ORDER — ROCURONIUM BROMIDE 50 MG/5ML IV SOLN
INTRAVENOUS | Status: AC
  Filled 2019-12-03: qty 1

## 2019-12-03 MED ORDER — ONDANSETRON HCL 4 MG/2ML IJ SOLN: 4.0000 mg | Freq: Once | INTRAMUSCULAR | Status: DC | PRN

## 2019-12-03 MED ORDER — LIDOCAINE HCL (PF) 2 % IJ SOLN
INTRAMUSCULAR | Status: AC
  Filled 2019-12-03: qty 10

## 2019-12-03 MED ORDER — BUPIVACAINE-EPINEPHRINE 0.25% -1:200000 IJ SOLN
INTRAMUSCULAR | Status: DC | PRN
  Administered 2019-12-03: 30 mL

## 2019-12-03 MED ORDER — ONDANSETRON HCL 4 MG/2ML IJ SOLN
INTRAMUSCULAR | Status: AC
  Filled 2019-12-03: qty 2

## 2019-12-03 MED ORDER — DEXAMETHASONE SODIUM PHOSPHATE 10 MG/ML IJ SOLN
INTRAMUSCULAR | Status: DC | PRN
  Administered 2019-12-03: 8 mg via INTRAVENOUS

## 2019-12-03 MED ORDER — KETOROLAC TROMETHAMINE 30 MG/ML IJ SOLN
INTRAMUSCULAR | Status: AC
  Filled 2019-12-03: qty 1

## 2019-12-03 MED ORDER — BUPIVACAINE LIPOSOME 1.3 % IJ SUSP
INTRAMUSCULAR | Status: DC | PRN
  Administered 2019-12-03: 20 mL

## 2019-12-03 MED ORDER — GABAPENTIN 300 MG PO CAPS: 300.0000 mg | ORAL_CAPSULE | ORAL | Status: AC

## 2019-12-03 MED ORDER — CLINDAMYCIN PHOSPHATE 900 MG/50ML IV SOLN
INTRAVENOUS | Status: AC
  Filled 2019-12-03: qty 50

## 2019-12-03 MED ORDER — MIDAZOLAM HCL 2 MG/2ML IJ SOLN
INTRAMUSCULAR | Status: AC
  Filled 2019-12-03: qty 2

## 2019-12-03 MED ORDER — GLYCOPYRROLATE 0.2 MG/ML IJ SOLN
INTRAMUSCULAR | Status: AC
  Filled 2019-12-03: qty 1

## 2019-12-03 MED ORDER — LIDOCAINE HCL (CARDIAC) PF 100 MG/5ML IV SOSY
PREFILLED_SYRINGE | INTRAVENOUS | Status: DC | PRN
  Administered 2019-12-03: 100 mg via INTRAVENOUS

## 2019-12-03 MED ORDER — PROPOFOL 10 MG/ML IV BOLUS
INTRAVENOUS | Status: AC
  Filled 2019-12-03: qty 20

## 2019-12-03 MED ORDER — LACTATED RINGERS IV SOLN: INTRAVENOUS | Status: DC

## 2019-12-03 MED ORDER — FENTANYL CITRATE (PF) 100 MCG/2ML IJ SOLN: 25.0000 ug | INTRAMUSCULAR | Status: DC | PRN

## 2019-12-03 MED ORDER — FENTANYL CITRATE (PF) 100 MCG/2ML IJ SOLN
INTRAMUSCULAR | Status: AC
  Filled 2019-12-03: qty 2

## 2019-12-03 MED ORDER — ACETAMINOPHEN 500 MG PO TABS
ORAL_TABLET | ORAL | Status: AC
  Administered 2019-12-03: 1000 mg via ORAL
  Filled 2019-12-03: qty 2

## 2019-12-03 MED ORDER — MIDAZOLAM HCL 2 MG/2ML IJ SOLN
INTRAMUSCULAR | Status: DC | PRN
  Administered 2019-12-03: 2 mg via INTRAVENOUS

## 2019-12-03 MED ORDER — KETOROLAC TROMETHAMINE 30 MG/ML IJ SOLN
INTRAMUSCULAR | Status: DC | PRN
  Administered 2019-12-03: 30 mg via INTRAVENOUS

## 2019-12-03 MED ORDER — GABAPENTIN 300 MG PO CAPS
ORAL_CAPSULE | ORAL | Status: AC
  Administered 2019-12-03: 300 mg via ORAL
  Filled 2019-12-03: qty 1

## 2019-12-03 MED ORDER — ROCURONIUM BROMIDE 100 MG/10ML IV SOLN
INTRAVENOUS | Status: DC | PRN
  Administered 2019-12-03: 5 mg via INTRAVENOUS

## 2019-12-03 MED ORDER — FENTANYL CITRATE (PF) 100 MCG/2ML IJ SOLN
INTRAMUSCULAR | Status: DC | PRN
  Administered 2019-12-03 (×2): 25 ug via INTRAVENOUS
  Administered 2019-12-03: 100 ug via INTRAVENOUS

## 2019-12-03 MED ORDER — ACETAMINOPHEN 500 MG PO TABS: 1000.0000 mg | ORAL_TABLET | ORAL | Status: AC

## 2019-12-03 MED ORDER — SUCCINYLCHOLINE CHLORIDE 20 MG/ML IJ SOLN
INTRAMUSCULAR | Status: DC | PRN
  Administered 2019-12-03: 120 mg via INTRAVENOUS

## 2019-12-03 MED ORDER — EPINEPHRINE PF 1 MG/ML IJ SOLN
INTRAMUSCULAR | Status: AC
  Filled 2019-12-03: qty 1

## 2019-12-03 MED ORDER — PROPOFOL 10 MG/ML IV BOLUS
INTRAVENOUS | Status: DC | PRN
  Administered 2019-12-03: 180 mg via INTRAVENOUS

## 2019-12-03 MED ORDER — BUPIVACAINE HCL (PF) 0.25 % IJ SOLN
INTRAMUSCULAR | Status: AC
  Filled 2019-12-03: qty 30

## 2019-12-03 MED ORDER — SEVOFLURANE IN SOLN
RESPIRATORY_TRACT | Status: AC
  Filled 2019-12-03: qty 250

## 2019-12-03 MED ORDER — SUCCINYLCHOLINE CHLORIDE 20 MG/ML IJ SOLN
INTRAMUSCULAR | Status: AC
  Filled 2019-12-03: qty 1

## 2019-12-03 SURGICAL SUPPLY — 25 items
APPLIER CLIP 9.375 SM OPEN (CLIP) ×3
BLADE SURG 15 STRL LF DISP TIS (BLADE) ×1 IMPLANT
BLADE SURG 15 STRL SS (BLADE) ×2
CANISTER SUCT 1200ML W/VALVE (MISCELLANEOUS) ×3 IMPLANT
CHLORAPREP W/TINT 26 (MISCELLANEOUS) ×3 IMPLANT
CLIP APPLIE 9.375 SM OPEN (CLIP) ×1 IMPLANT
COVER WAND RF STERILE (DRAPES) ×3 IMPLANT
DERMABOND ADVANCED (GAUZE/BANDAGES/DRESSINGS) ×2
DERMABOND ADVANCED .7 DNX12 (GAUZE/BANDAGES/DRESSINGS) ×1 IMPLANT
DRAPE LAPAROTOMY TRNSV 106X77 (MISCELLANEOUS) ×3 IMPLANT
GAUZE PACKING IODOFORM 1/2 (PACKING) ×3 IMPLANT
GAUZE SPONGE 4X4 12PLY STRL (GAUZE/BANDAGES/DRESSINGS) ×3 IMPLANT
GLOVE BIO SURGEON STRL SZ7 (GLOVE) ×3 IMPLANT
GOWN STRL REUS W/ TWL LRG LVL3 (GOWN DISPOSABLE) ×2 IMPLANT
GOWN STRL REUS W/TWL LRG LVL3 (GOWN DISPOSABLE) ×4
LABEL OR SOLS (LABEL) ×3 IMPLANT
NEEDLE HYPO 22GX1.5 SAFETY (NEEDLE) ×3 IMPLANT
NS IRRIG 500ML POUR BTL (IV SOLUTION) ×3 IMPLANT
PACK BASIN MINOR ARMC (MISCELLANEOUS) ×3 IMPLANT
SPONGE LAP 18X18 RF (DISPOSABLE) ×3 IMPLANT
SUT MNCRL 4-0 (SUTURE) ×2
SUT MNCRL 4-0 27XMFL (SUTURE) ×1
SUT VIC AB 2-0 CT2 27 (SUTURE) ×6 IMPLANT
SUTURE MNCRL 4-0 27XMF (SUTURE) ×1 IMPLANT
SYR 10ML LL (SYRINGE) ×6 IMPLANT

## 2019-12-03 NOTE — Op Note (Signed)
  Pre-operative Diagnosis: Hidradenitis Left axilla with abscess I the setting of potential breast CA   Post-operative Diagnosis: Same   Anesthesia: GETA  Procedure:  Excision complex hidradenitis left axilla Axilla lymph node biopsy axilla Excisional debridement of skin/fascia 24cm2   Findings: Hidradenitis in the left axilla with abscess.  Reactive lymphadenopathy  Estimated Blood Loss: Minimal         Drains: None         Specimens: Hidradenitis sinus, reactive lymph node left axilla deep       Complications: None                 Condition: Stable   Procedure Details  The patient was seen again in the Holding Room. The benefits, complications, treatment options, and expected outcomes were discussed with the patient. The risks of bleeding, infection, recurrence of symptoms, failure to resolve symptoms, hematoma, seroma, open wound, cosmetic deformity, and the need for further surgery were discussed.  The patient was taken to Operating Room, identified as Samantha Orr and the procedure verified.  A Time Out was held and the above information confirmed.  Prior to the induction of general anesthesia, antibiotic prophylaxis was administered. VTE prophylaxis was in place. Appropriate anesthesia was then administered and tolerated well. The Axilla was prepped with Chloraprep and draped in the sterile fashion. The patient was positioned in the supine position.    Elliptical incision was created and we encounter about 5 cc of pus.  This was cultured appropriately.  Were able to excise the sinus of the hidradenitis using electrocautery.  A good amount of tissue was removed from the subcutaneous tissue.  We also proceeded to do some excisional debridement of the fascia of the pectoralis since there was some inflammatory response.  This was performed using a curette. There was a reactive lymph node within the axilla that because of potential concern of DCIS I decided to perform an  excisional biopsy.  I proceeded to clip the lymphatic channels and sent for specimen.  Wound was irrigated and a wet-to-dry was placed. Liposomal Marcaine was used for postoperative analgesia   D. Ily Denno MD, FACS

## 2019-12-03 NOTE — Interval H&P Note (Signed)
History and Physical Interval Note:  12/03/2019 10:39 AM  Samantha Orr  has presented today for surgery, with the diagnosis of Hidradenitis L.  The various methods of treatment have been discussed with the patient and family. After consideration of risks, benefits and other options for treatment, the patient has consented to  Procedure(s): EXCISION HIDRADENITIS AXILLA Left (Left) as a surgical intervention.  The patient's history has been reviewed, patient examined, no change in status, stable for surgery.  I have reviewed the patient's chart and labs.  Questions were answered to the patient's satisfaction.     Michiah Masse F Aydeen Blume

## 2019-12-03 NOTE — OR Nursing (Signed)
On PACU hold

## 2019-12-03 NOTE — Anesthesia Procedure Notes (Signed)
Procedure Name: Intubation Date/Time: 12/03/2019 11:58 AM Performed by: Malva Cogan, CRNA Pre-anesthesia Checklist: Patient identified, Patient being monitored, Timeout performed, Emergency Drugs available and Suction available Patient Re-evaluated:Patient Re-evaluated prior to induction Oxygen Delivery Method: Circle system utilized Preoxygenation: Pre-oxygenation with 100% oxygen Induction Type: IV induction Ventilation: Mask ventilation without difficulty Laryngoscope Size: 3 and McGraph Grade View: Grade I Tube type: Oral Tube size: 7.0 mm Number of attempts: 1 Airway Equipment and Method: Stylet Placement Confirmation: ETT inserted through vocal cords under direct vision,  positive ETCO2 and breath sounds checked- equal and bilateral Secured at: 21 cm Tube secured with: Tape Dental Injury: Teeth and Oropharynx as per pre-operative assessment

## 2019-12-03 NOTE — Transfer of Care (Signed)
Immediate Anesthesia Transfer of Care Note  Patient: Samantha Orr  Procedure(s) Performed: EXCISION HIDRADENITIS AXILLA Left (Left Axilla)  Patient Location: PACU  Anesthesia Type:General  Level of Consciousness: awake and alert   Airway & Oxygen Therapy: Patient Spontanous Breathing and Patient connected to face mask oxygen  Post-op Assessment: Report given to RN and Post -op Vital signs reviewed and stable  Post vital signs: Reviewed and stable  Last Vitals:  Vitals Value Taken Time  BP    Temp    Pulse 84 12/03/19 1311  Resp 21 12/03/19 1311  SpO2 98 % 12/03/19 1311  Vitals shown include unvalidated device data.  Last Pain:  Vitals:   12/03/19 1110  TempSrc: Temporal  PainSc: 6          Complications: No apparent anesthesia complications

## 2019-12-03 NOTE — Discharge Instructions (Addendum)
AMBULATORY SURGERY  DISCHARGE INSTRUCTIONS   1) The drugs that you were given will stay in your system until tomorrow so for the next 24 hours you should not:  A) Drive an automobile B) Make any legal decisions C) Drink any alcoholic beverage   2) You may resume regular meals tomorrow.  Today it is better to start with liquids and gradually work up to solid foods.  You may eat anything you prefer, but it is better to start with liquids, then soup and crackers, and gradually work up to solid foods.   3) Please notify your doctor immediately if you have any unusual bleeding, trouble breathing, redness and pain at the surgery site, drainage, fever, or pain not relieved by medication.    4) Additional Instructions:        Please contact your physician with any problems or Same Day Surgery at [phone], Monday through Friday 6 am to 4 pm, or [place] [facility] number at [phone].Hidradenitis Suppurativa Hidradenitis suppurativa is a long-term (chronic) skin disease. It is similar to a severe form of acne, but it affects areas of the body where acne would be unusual, especially areas of the body where skin rubs against skin and becomes moist. These include:  Underarms.  Groin.  Genital area.  Buttocks.  Upper thighs.  Breasts. Hidradenitis suppurativa may start out as small lumps or pimples caused by blocked sweat glands or hair follicles. Pimples may develop into deep sores that break open (rupture) and drain pus. Over time, affected areas of skin may thicken and become scarred. This condition is rare and does not spread from person to person (non-contagious). What are the causes? The exact cause of this condition is not known. It may be related to:  Female and female hormones.  An overactive disease-fighting system (immune system). The immune system may over-react to blocked hair follicles or sweat glands and cause swelling and pus-filled  sores. What increases the risk? You are more likely to develop this condition if you:  Are female.  Are 14-86 years old.  Have a family history of hidradenitis suppurativa.  Have a personal history of acne.  Are overweight.  Smoke.  Take the medicine lithium. What are the signs or symptoms? The first symptoms are usually painful bumps in the skin, similar to pimples. The condition may get worse over time (progress), or it may only cause mild symptoms. If the disease progresses, symptoms may include:  Skin bumps getting bigger and growing deeper into the skin.  Bumps rupturing and draining pus.  Itchy, infected skin.  Skin getting thicker and scarred.  Tunnels under the skin (fistulas) where pus drains from a bump.  Pain during daily activities, such as pain during walking if your groin area is affected.  Emotional problems, such as stress or depression. This condition may affect your appearance and your ability or willingness to wear certain clothes or do certain activities. How is this diagnosed? This condition is diagnosed by a health care provider who specializes in skin diseases (dermatologist). You may be diagnosed based on:  Your symptoms and medical history.  A physical exam.  Testing a pus sample for infection.  Blood tests. How is this treated? Your treatment will depend on how severe your symptoms are. The same treatment will not work for everybody with this condition. You may need to try several treatments to find what works best for you. Treatment may include:  Cleaning and bandaging (dressing) your wounds  as needed.  Lifestyle changes, such as new skin care routines.  Taking medicines, such as: ? Antibiotics. ? Acne medicines. ? Medicines to reduce the activity of the immune system. ? A diabetes medicine (metformin). ? Birth control pills, for women. ? Steroids to reduce swelling and pain.  Working with a mental health care provider, if you  experience emotional distress due to this condition. If you have severe symptoms that do not get better with medicine, you may need surgery. Surgery may involve:  Using a laser to clear the skin and remove hair follicles.  Opening and draining deep sores.  Removing the areas of skin that are diseased and scarred. Follow these instructions at home: Medicines   Take over-the-counter and prescription medicines only as told by your health care provider.  If you were prescribed an antibiotic medicine, take it as told by your health care provider. Do not stop taking the antibiotic even if your condition improves. Skin care  If you have open wounds, cover them with a clean dressing as told by your health care provider. Keep wounds clean by washing them gently with soap and water when you bathe.  Do not shave the areas where you get hidradenitis suppurativa.  Do not wear deodorant.  Wear loose-fitting clothes.  Try to avoid getting overheated or sweaty. If you get sweaty or wet, change into clean, dry clothes as soon as you can.  To help relieve pain and itchiness, cover sore areas with a warm, clean washcloth (warm compress) for 5-10 minutes as often as needed.  If told by your health care provider, take a bleach bath twice a week: ? Fill your bathtub halfway with water. ? Pour in  cup of unscented household bleach. ? Soak in the tub for 5-10 minutes. ? Only soak from the neck down. Avoid water on your face and hair. ? Shower to rinse off the bleach from your skin. General instructions  Learn as much as you can about your disease so that you have an active role in your treatment. Work closely with your health care provider to find treatments that work for you.  If you are overweight, work with your health care provider to lose weight as recommended.  Do not use any products that contain nicotine or tobacco, such as cigarettes and e-cigarettes. If you need help quitting, ask your  health care provider.  If you struggle with living with this condition, talk with your health care provider or work with a mental health care provider as recommended.  Keep all follow-up visits as told by your health care provider. This is important. Where to find more information  Hidradenitis Suppurativa Foundation, Inc.: https://www.hs-foundation.org/ Contact a health care provider if you have:  A flare-up of hidradenitis suppurativa.  A fever or chills.  Trouble controlling your symptoms at home.  Trouble doing your daily activities because of your symptoms.  Trouble dealing with emotional problems related to your condition. Summary  Hidradenitis suppurativa is a long-term (chronic) skin disease. It is similar to a severe form of acne, but it affects areas of the body where acne would be unusual.  The first symptoms are usually painful bumps in the skin, similar to pimples. The condition may get worse over time (progress), or it may only cause mild symptoms.  If you have open wounds, cover them with a clean dressing as told by your health care provider. Keep wounds clean by washing them gently with soap and water when you bathe.  Besides skin care, treatment may include medicines, laser treatment, and surgery. This information is not intended to replace advice given to you by your health care provider. Make sure you discuss any questions you have with your health care provider. Document Revised: 10/09/2017 Document Reviewed: 10/09/2017 Elsevier Patient Education  2020 [employer].

## 2019-12-03 NOTE — Anesthesia Postprocedure Evaluation (Signed)
Anesthesia Post Note  Patient: Samantha Orr  Procedure(s) Performed: EXCISION HIDRADENITIS AXILLA Left (Left Axilla)  Patient location during evaluation: PACU Anesthesia Type: General Level of consciousness: awake and alert Pain management: pain level controlled Vital Signs Assessment: post-procedure vital signs reviewed and stable Respiratory status: spontaneous breathing and respiratory function stable Cardiovascular status: stable Anesthetic complications: no     Last Vitals:  Vitals:   12/03/19 1417 12/03/19 1446  BP: 116/64 (!) 102/57  Pulse: 74 76  Resp: 16 16  Temp: 36.4 C   SpO2: 100% 99%    Last Pain:  Vitals:   12/03/19 1446  TempSrc:   PainSc: 2                  Latora Quarry K

## 2019-12-03 NOTE — Anesthesia Preprocedure Evaluation (Addendum)
Anesthesia Evaluation  Patient identified by MRN, date of birth, ID band Patient awake    Reviewed: Allergy & Precautions, NPO status , Patient's Chart, lab work & pertinent test results  History of Anesthesia Complications (+) history of anesthetic complications (blood pressure drop with c-section)  Airway Mallampati: III     Mouth opening: Limited Mouth Opening  Dental  (+) Loose, Chipped, Missing,    Pulmonary neg sleep apnea, neg COPD, Current Smoker,           Cardiovascular (-) hypertension(-) Past MI and (-) CHF (-) dysrhythmias (-) Valvular Problems/Murmurs     Neuro/Psych neg Seizures    GI/Hepatic Neg liver ROS, GERD  Medicated,  Endo/Other  neg diabetes  Renal/GU negative Renal ROS     Musculoskeletal   Abdominal   Peds  Hematology   Anesthesia Other Findings   Reproductive/Obstetrics                            Anesthesia Physical Anesthesia Plan  ASA: II  Anesthesia Plan: General   Post-op Pain Management:    Induction: Intravenous  PONV Risk Score and Plan: 2 and Ondansetron and Dexamethasone  Airway Management Planned: Oral ETT  Additional Equipment:   Intra-op Plan:   Post-operative Plan:   Informed Consent: I have reviewed the patients History and Physical, chart, labs and discussed the procedure including the risks, benefits and alternatives for the proposed anesthesia with the patient or authorized representative who has indicated his/her understanding and acceptance.       Plan Discussed with:   Anesthesia Plan Comments:         Anesthesia Quick Evaluation

## 2019-12-04 LAB — SURGICAL PATHOLOGY

## 2019-12-07 ENCOUNTER — Other Ambulatory Visit: Payer: Self-pay | Admitting: Surgery

## 2019-12-07 DIAGNOSIS — N6320 Unspecified lump in the left breast, unspecified quadrant: Secondary | ICD-10-CM

## 2019-12-07 DIAGNOSIS — N6310 Unspecified lump in the right breast, unspecified quadrant: Secondary | ICD-10-CM

## 2019-12-08 ENCOUNTER — Other Ambulatory Visit: Payer: Self-pay | Admitting: Surgery

## 2019-12-08 ENCOUNTER — Encounter: Payer: Self-pay | Admitting: Emergency Medicine

## 2019-12-08 ENCOUNTER — Telehealth: Payer: Self-pay

## 2019-12-08 ENCOUNTER — Emergency Department
Admission: EM | Admit: 2019-12-08 | Discharge: 2019-12-08 | Disposition: A | Discharge status: Home / Self Care | Payer: Medicaid Other | Attending: Emergency Medicine | Admitting: Emergency Medicine

## 2019-12-08 ENCOUNTER — Other Ambulatory Visit: Payer: Self-pay

## 2019-12-08 DIAGNOSIS — F17210 Nicotine dependence, cigarettes, uncomplicated: Secondary | ICD-10-CM | POA: Diagnosis not present

## 2019-12-08 DIAGNOSIS — G8918 Other acute postprocedural pain: Secondary | ICD-10-CM | POA: Diagnosis not present

## 2019-12-08 DIAGNOSIS — Z79899 Other long term (current) drug therapy: Secondary | ICD-10-CM | POA: Insufficient documentation

## 2019-12-08 DIAGNOSIS — Z4889 Encounter for other specified surgical aftercare: Secondary | ICD-10-CM

## 2019-12-08 DIAGNOSIS — Z4801 Encounter for change or removal of surgical wound dressing: Secondary | ICD-10-CM | POA: Diagnosis not present

## 2019-12-08 DIAGNOSIS — N6452 Nipple discharge: Secondary | ICD-10-CM

## 2019-12-08 LAB — AEROBIC/ANAEROBIC CULTURE W GRAM STAIN (SURGICAL/DEEP WOUND)

## 2019-12-08 MED ORDER — LIDOCAINE HCL (PF) 1 % IJ SOLN
INTRAMUSCULAR | Status: AC
  Administered 2019-12-08: 06:00:00 5 mL
  Filled 2019-12-08: qty 5

## 2019-12-08 MED ORDER — OXYCODONE-ACETAMINOPHEN 5-325 MG PO TABS: 1.0000 | ORAL_TABLET | ORAL | 0 refills | Status: DC | PRN

## 2019-12-08 MED ORDER — LIDOCAINE HCL (PF) 1 % IJ SOLN: 5.0000 mL | Freq: Once | INTRAMUSCULAR | Status: AC

## 2019-12-08 MED ORDER — OXYCODONE-ACETAMINOPHEN 5-325 MG PO TABS
2.0000 | ORAL_TABLET | Freq: Once | ORAL | Status: AC
  Administered 2019-12-08: 2 via ORAL
  Filled 2019-12-08: qty 2

## 2019-12-08 MED ORDER — MORPHINE SULFATE (PF) 2 MG/ML IV SOLN: 2.0000 mg | Freq: Once | INTRAVENOUS | Status: AC

## 2019-12-08 MED ORDER — MORPHINE SULFATE (PF) 2 MG/ML IV SOLN
INTRAVENOUS | Status: AC
  Administered 2019-12-08: 2 mg via INTRAVENOUS
  Filled 2019-12-08: qty 1

## 2019-12-08 NOTE — Telephone Encounter (Signed)
Spoke with patient-due to transportation issues she is unable to come for nurse visits- however per Dr.Pabon she doesn't need to pack wound but will need to wash the area with soap and water daily and place dry dressing and secure with tape. Patient is scheduled for left breast biopsy 12/09/19 and follow up appointment scheduled 12/23/19 to see Dr.Pabon

## 2019-12-08 NOTE — ED Notes (Signed)
NAD noted at time of D/C. Pt denies questions or concerns. Pt ambulatory to the lobby at this time.  

## 2019-12-08 NOTE — ED Provider Notes (Signed)
[facility] Emergency Department Provider Note  ____________________________________________   First MD Initiated Contact with Patient 12/08/19 [phone]     (approximate)  I have reviewed the triage vital signs and the nursing notes.   HISTORY  Chief Complaint Post-op Problem    HPI Samantha Orr is a 34 y.o. female with below list of previous medical conditions including hidradenitis suppurativa status post "complex excision of hidradenitis in the left axilla presents to the emergency department secondary to  postoperative pain.  Patient states that she has not changed her dressing since surgery on 12/03/2019 secondary to inability to do so.  Patient states that she does not have a follow-up appointment.  Patient denies any fever no noted drainage from the wound.        Past Medical History:  Diagnosis Date  . Abscess   . Complication of anesthesia    hr/bp dropped after c/s 2015 in pacu   had spinal  . GERD (gastroesophageal reflux disease)    sometimes throws up when gas gets in her chest  . Shortness of breath dyspnea     Patient Active Problem List   Diagnosis Date Noted  . Obesity 11/03/2019  . History of abnormal cervical Pap smear 10/10/2018  . Abdominal bloating 10/10/2018  . Hidradenitis suppurativa   . Status post laparoscopic cholecystectomy 11/25/2014    Past Surgical History:  Procedure Laterality Date  . ABCESS DRAINAGE     BUTTOCK  . ABDOMINAL SURGERY     tubal, hernia and gallbladder  . CESAREAN SECTION    . CESAREAN SECTION  2012 and 2015  . CHOLECYSTECTOMY    . COLPOSCOPY  2015  . DILATION AND CURETTAGE OF UTERUS    . GALLBLADDER SURGERY  2016  . HERNIA REPAIR    . HYDRADENITIS EXCISION Bilateral 06/05/2016   Procedure: , right axilla;  Surgeon: Leafy Ro, MD;  Location: [facility] ORS;  Service: General;  Laterality: Bilateral;  . HYDRADENITIS EXCISION Left 12/03/2019   Procedure: EXCISION HIDRADENITIS AXILLA Left;   Surgeon: Leafy Ro, MD;  Location: [facility] ORS;  Service: General;  Laterality: Left;  . INGUINAL HERNIA REPAIR  2016  . TUBAL LIGATION    . TUBAL LIGATION    . TUBAL LIGATION  2016    Prior to Admission medications   Medication Sig Start Date End Date Taking? Authorizing Provider  clindamycin (CLEOCIN) 150 MG capsule Take 2 capsules (300 mg total) by mouth 3 (three) times daily. Patient taking differently: Take 300 mg by mouth 2 (two) times daily.  01/01/18   Fisher, Roselyn Bering, PA-C  HYDROcodone-acetaminophen (NORCO/VICODIN) 5-325 MG tablet Take 1-2 tablets by mouth every 4 (four) hours as needed for moderate pain. 12/03/19   Pabon, Merri Ray, MD    Allergies Bactrim [sulfamethoxazole-trimethoprim]  Family History  Problem Relation Age of Onset  . Hypertension Mother   . Breast cancer Maternal Aunt   . Uterine cancer Maternal Grandmother   . Diabetes Maternal Grandfather   . Breast cancer Maternal Grandfather        59    Social History Social History   Tobacco Use  . Smoking status: Current Every Day Smoker    Packs/day: 0.50    Types: Cigarettes  . Smokeless tobacco: Never Used  Substance Use Topics  . Alcohol use: Yes    Alcohol/week: 6.0 standard drinks    Types: 6 Cans of beer per week    Comment: weekly  . Drug use: No  Review of Systems Constitutional: No fever/chills Eyes: No visual changes. ENT: No sore throat. Cardiovascular: Denies chest pain. Respiratory: Denies shortness of breath. Gastrointestinal: No abdominal pain.  No nausea, no vomiting.  No diarrhea.  No constipation. Genitourinary: Negative for dysuria. Musculoskeletal: Negative for neck pain.  Negative for back pain. Integumentary: Negative for rash.  Positive for surgical left axilla wound Neurological: Negative for headaches, focal weakness or numbness.   ____________________________________________   PHYSICAL EXAM:  VITAL SIGNS: ED Triage Vitals  Enc Vitals Group     BP 12/08/19  0516 123/68     Pulse Rate 12/08/19 0516 84     Resp 12/08/19 0516 20     Temp 12/08/19 0516 97.8 F (36.6 C)     Temp Source 12/08/19 0516 Oral     SpO2 12/08/19 0516 99 %     Weight 12/08/19 0512 85.7 kg (189 lb)     Height 12/08/19 0512 1.575 m (5\' 2" )     Head Circumference --      Peak Flow --      Pain Score 12/08/19 0512 10     Pain Loc --      Pain Edu? --      Excl. in GC? --      Constitutional: Alert and oriented.  Eyes: Conjunctivae are normal.  Mouth/Throat: Patient is wearing a mask. Neck: No stridor.  No meningeal signs.   Cardiovascular: Normal rate, regular rhythm. Good peripheral circulation. Grossly normal heart sounds. Respiratory: Normal respiratory effort.  No retractions. Gastrointestinal: Soft and nontender. No distention.  Musculoskeletal: Large left axilla wound with gauze packing in place no active signs of infection. Neurologic:  Normal speech and language. No gross focal neurologic deficits are appreciated.  Skin:  Skin is warm, dry and intact. Psychiatric: Mood and affect are normal. Speech and behavior are normal.    Procedures   ____________________________________________   INITIAL IMPRESSION / MDM / ASSESSMENT AND PLAN / ED COURSE  As part of my medical decision making, I reviewed the following data within the electronic MEDICAL RECORD NUMBER   34 year old female presented with above-stated history and physical exam secondary to left axilla wound.  Wet-to-dry dressing was changed in the emergency department.  Patient received IV morphine 2 mg and subsequently Percocet five 325 tablet.  Patient will be referred to Dr. 31 and the wound care center for outpatient follow-up  ____________________________________________  FINAL CLINICAL IMPRESSION(S) / ED DIAGNOSES  Final diagnoses:  Encounter for postoperative wound care     MEDICATIONS GIVEN DURING THIS VISIT:  Medications  oxyCODONE-acetaminophen (PERCOCET/ROXICET) 5-325 MG per  tablet 2 tablet (2 tablets Oral Given 12/08/19 0607)  lidocaine (PF) (XYLOCAINE) 1 % injection 5 mL (5 mLs Other Given by Other 12/08/19 0610)  morphine 2 MG/ML injection 2 mg (2 mg Intravenous Given 12/08/19 12/10/19)     ED Discharge Orders    None      *Please note:  MARLYN TONDREAU was evaluated in Emergency Department on 12/08/2019 for the symptoms described in the history of present illness. She was evaluated in the context of the global COVID-19 pandemic, which necessitated consideration that the patient might be at risk for infection with the SARS-CoV-2 virus that causes COVID-19. Institutional protocols and algorithms that pertain to the evaluation of patients at risk for COVID-19 are in a state of rapid change based on information released by regulatory bodies including the CDC and federal and state organizations. These policies and algorithms were followed during the  patient's care in the ED.  Some ED evaluations and interventions may be delayed as a result of limited staffing during the pandemic.*  Note:  This document was prepared using Dragon voice recognition software and may include unintentional dictation errors.   Gregor Hams, MD 12/08/19 [phone]

## 2019-12-08 NOTE — ED Triage Notes (Signed)
Patient ambulatory to triage with steady gait, without difficulty or distress noted, mask in place; pt reports hydradenitis excision last Thursday, left axillae; c/o postop pain and unable to change dressing

## 2019-12-09 ENCOUNTER — Ambulatory Visit
Admission: RE | Admit: 2019-12-09 | Discharge: 2019-12-09 | Disposition: A | Discharge status: Home / Self Care | Payer: Medicaid Other | Source: Ambulatory Visit | Attending: Surgery | Admitting: Surgery

## 2019-12-09 ENCOUNTER — Other Ambulatory Visit (HOSPITAL_COMMUNITY): Payer: Self-pay | Admitting: Diagnostic Radiology

## 2019-12-09 DIAGNOSIS — N6320 Unspecified lump in the left breast, unspecified quadrant: Secondary | ICD-10-CM

## 2019-12-09 DIAGNOSIS — N6321 Unspecified lump in the left breast, upper outer quadrant: Secondary | ICD-10-CM | POA: Diagnosis not present

## 2019-12-09 DIAGNOSIS — R928 Other abnormal and inconclusive findings on diagnostic imaging of breast: Secondary | ICD-10-CM | POA: Diagnosis not present

## 2019-12-09 DIAGNOSIS — N6012 Diffuse cystic mastopathy of left breast: Secondary | ICD-10-CM | POA: Diagnosis not present

## 2019-12-09 DIAGNOSIS — N6325 Unspecified lump in the left breast, overlapping quadrants: Secondary | ICD-10-CM | POA: Diagnosis not present

## 2019-12-09 MED ORDER — GADOBUTROL 1 MMOL/ML IV SOLN
10.0000 mL | Freq: Once | INTRAVENOUS | Status: AC | PRN
  Administered 2019-12-09: 10 mL via INTRAVENOUS

## 2019-12-10 ENCOUNTER — Other Ambulatory Visit: Payer: Medicaid Other

## 2019-12-15 ENCOUNTER — Inpatient Hospital Stay: Admission: RE | Admit: 2019-12-15 | Payer: Medicaid Other | Source: Ambulatory Visit

## 2019-12-15 ENCOUNTER — Other Ambulatory Visit: Payer: Medicaid Other

## 2019-12-16 ENCOUNTER — Telehealth: Payer: Self-pay | Admitting: Emergency Medicine

## 2019-12-16 ENCOUNTER — Ambulatory Visit: Payer: Self-pay | Admitting: Surgery

## 2019-12-16 NOTE — Telephone Encounter (Signed)
I also advised Lanora Manis that pt was adament of not having bx done so we just needed to wait on rescheduling pt.   Dr Everlene Farrier made aware of this. States to hold off on rescheduling pt and to have her come in to the office.   Pt is scheduled 12/23/19 for a f/u. Called pt at this time and made her aware of the above. She states that she does not want to have bx done but she will come in for the follow up.

## 2019-12-16 NOTE — Telephone Encounter (Signed)
Called [facility] and spoke to [place] in regards to pt Biopsy appt. She stated pt No showed appt.   I called pt and asked if there was a reason of the no show. Pt stated she was not feeling well and she did not want to get the Biopsy done. Pt stated when she got her Left bx the radiologist told her that she can do the R breast Ultrasound first and then determine if the bx was needed. She states that if she has to have a bx she does not want to get it done.  I called back to [facility] and spoke to [place] and per recommendations that it was said R breast ultrasound with possible bx.

## 2019-12-17 ENCOUNTER — Other Ambulatory Visit: Payer: Medicaid Other

## 2019-12-23 ENCOUNTER — Other Ambulatory Visit: Payer: Self-pay

## 2019-12-23 ENCOUNTER — Encounter: Payer: Self-pay | Admitting: Surgery

## 2019-12-23 ENCOUNTER — Ambulatory Visit (INDEPENDENT_AMBULATORY_CARE_PROVIDER_SITE_OTHER): Payer: Medicaid Other | Admitting: Surgery

## 2019-12-23 ENCOUNTER — Encounter: Payer: Self-pay | Admitting: Emergency Medicine

## 2019-12-23 VITALS — BP 120/83 | HR 78 | Temp 97.9°F | Resp 12 | Ht 62.0 in | Wt 186.0 lb

## 2019-12-23 DIAGNOSIS — N644 Mastodynia: Secondary | ICD-10-CM

## 2019-12-23 DIAGNOSIS — S20112A Abrasion of breast, left breast, initial encounter: Secondary | ICD-10-CM

## 2019-12-23 DIAGNOSIS — L732 Hidradenitis suppurativa: Secondary | ICD-10-CM

## 2019-12-23 MED ORDER — DOXYCYCLINE HYCLATE 100 MG PO CAPS: 200.0000 mg | ORAL_CAPSULE | Freq: Two times a day (BID) | ORAL | 0 refills | Status: AC

## 2019-12-23 NOTE — Patient Instructions (Addendum)
Buy large bandaids to cover the area under your left arm. You will need to change these daily.   Your are scheduled for a Left Breast Ultrasound on 01/01/20 at 10:40am Arrival 15 minutes prior at the [facility].   Pick up your prescription at your local pharmacy.   We will see you for a follow up. Please see your appointment below.

## 2019-12-25 NOTE — Progress Notes (Signed)
This prior is following up after excision of hidradenitis in the left axilla.  She has been unable to tolerate any MRI guided biopsies.  She now complains of some breast pain.  No fevers no chills.  The hidradenitis healing well.  PE: NAD Breast: There is tender breast and says to be indurated however exam is very limited due to tenderness.  No evidence of obvious cellulitis, abscess or necrotizing infection.  Her axilla is healing well without evidence of infection.  A/P we will obtain breast U/S to assess for abscess, may need aspiration vs drainge Doxycycline course RTC 1-2 weeks I Spen at least 15 minutes in this encounter w > 50% spent in coordination and counseling. Please note that breast issue is independent of hidradenitis and we will need to reassess.

## 2020-01-01 ENCOUNTER — Other Ambulatory Visit: Payer: Medicaid Other

## 2020-01-08 ENCOUNTER — Telehealth: Payer: Self-pay

## 2020-01-08 NOTE — Telephone Encounter (Signed)
Patient no showed for Ultrasound -need to r/s -call [phone] and then call office to r/s with Dr.Pabon.  Tried calling however VM not set up and could not leave message.

## 2020-01-11 ENCOUNTER — Ambulatory Visit: Payer: Medicaid Other | Admitting: Surgery

## 2020-03-02 ENCOUNTER — Encounter: Payer: Self-pay | Admitting: *Deleted

## 2020-03-21 DIAGNOSIS — Z202 Contact with and (suspected) exposure to infections with a predominantly sexual mode of transmission: Secondary | ICD-10-CM | POA: Diagnosis not present

## 2020-03-21 DIAGNOSIS — Z872 Personal history of diseases of the skin and subcutaneous tissue: Secondary | ICD-10-CM | POA: Diagnosis not present

## 2020-03-23 ENCOUNTER — Ambulatory Visit: Payer: Medicaid Other | Admitting: Surgery

## 2020-03-23 ENCOUNTER — Telehealth: Payer: Self-pay

## 2020-03-23 ENCOUNTER — Other Ambulatory Visit: Payer: Self-pay

## 2020-03-23 ENCOUNTER — Encounter: Payer: Self-pay | Admitting: Surgery

## 2020-03-23 VITALS — BP 108/72 | HR 98 | Temp 97.9°F | Resp 12 | Ht 62.0 in | Wt 181.0 lb

## 2020-03-23 DIAGNOSIS — N644 Mastodynia: Secondary | ICD-10-CM

## 2020-03-23 NOTE — Patient Instructions (Addendum)
I will schedule the Breast Ultrasound and call you later today with the appointment information. If you have not heard from me by 2:00 pm please call our office.   Please see your follow up appointment listed below.

## 2020-03-23 NOTE — Telephone Encounter (Signed)
Patient notified of 03/29/20 @ 10:20 am appointment for left breast and left axilla ultrasound.  Patient verbalized understanding.

## 2020-03-28 NOTE — Progress Notes (Signed)
Outpatient Surgical Follow Up  03/28/2020  Samantha Orr is an 34 y.o. female.   Chief Complaint  Patient presents with  . Follow-up  . left axilla hidredenitis    HPI: Samantha Orr is a 34 year old female well-known to me with a prior history of hidradenitis bilateral axilla.  She has chronic left axillary numbness and pain as well as left breast pain.  He did have breast MRI on February showing evidence of reactive chronic inflammation She denies any fevers any chills but she is has persistent breast pain..  Past Medical History:  Diagnosis Date  . Abscess   . Complication of anesthesia    hr/bp dropped after c/s 2015 in pacu   had spinal  . GERD (gastroesophageal reflux disease)    sometimes throws up when gas gets in her chest  . Shortness of breath dyspnea     Past Surgical History:  Procedure Laterality Date  . ABCESS DRAINAGE     BUTTOCK  . ABDOMINAL SURGERY     tubal, hernia and gallbladder  . CESAREAN SECTION    . CESAREAN SECTION  2012 and 2015  . CHOLECYSTECTOMY    . COLPOSCOPY  2015  . DILATION AND CURETTAGE OF UTERUS    . GALLBLADDER SURGERY  2016  . HERNIA REPAIR    . HYDRADENITIS EXCISION Bilateral 06/05/2016   Procedure: , right axilla;  Surgeon: Leafy Ro, MD;  Location: [facility] ORS;  Service: General;  Laterality: Bilateral;  . HYDRADENITIS EXCISION Left 12/03/2019   Procedure: EXCISION HIDRADENITIS AXILLA Left;  Surgeon: Leafy Ro, MD;  Location: [facility] ORS;  Service: General;  Laterality: Left;  . INGUINAL HERNIA REPAIR  2016  . TUBAL LIGATION    . TUBAL LIGATION    . TUBAL LIGATION  2016    Family History  Problem Relation Age of Onset  . Hypertension Mother   . Breast cancer Maternal Aunt   . Uterine cancer Maternal Grandmother   . Diabetes Maternal Grandfather   . Breast cancer Maternal Grandfather        35    Social History:  reports that she has been smoking cigarettes. She has been smoking about 0.50 packs per day. She has never  used smokeless tobacco. She reports current alcohol use of about 6.0 standard drinks of alcohol per week. She reports that she does not use drugs.  Allergies:  Allergies  Allergen Reactions  . Bactrim [Sulfamethoxazole-Trimethoprim] Hives    Medications reviewed.    ROS Full ROS performed and is otherwise negative other than what is stated in HPI   BP 108/72   Pulse 98   Temp 97.9 F (36.6 C) (Oral)   Resp 12   Ht 5\' 2"  (1.575 m)   Wt 181 lb (82.1 kg)   SpO2 94%   BMI 33.11 kg/m   Physical Exam Vitals and nursing note reviewed. Exam conducted with a chaperone present.  Constitutional:      General: She is not in acute distress.    Appearance: Normal appearance. She is not ill-appearing.  Cardiovascular:     Rate and Rhythm: Normal rate.     Pulses: Normal pulses.  Pulmonary:     Effort: Pulmonary effort is normal. No respiratory distress.     Breath sounds: Normal breath sounds. No stridor. No wheezing or rales.     Comments: Breast: There is no evidence of any palpable masses.  There is no evidence of mastitis.  The both axilla have no evidence  of active hidradenitis abscess or masses.  Extremities reveal no evidence of lymphedema or any edema in the upper extremity. Abdominal:     General: Abdomen is flat. There is no distension.     Palpations: There is no mass.     Tenderness: There is no abdominal tenderness. There is no guarding or rebound.     Hernia: No hernia is present.  Musculoskeletal:        General: Normal range of motion.     Cervical back: Normal range of motion and neck supple. No rigidity.  Skin:    General: Skin is warm and dry.     Capillary Refill: Capillary refill takes less than 2 seconds.  Neurological:     Mental Status: She is alert and oriented to person, place, and time.  Psychiatric:        Mood and Affect: Mood normal.        Behavior: Behavior normal.        Thought Content: Thought content normal.        Judgment: Judgment  normal.      Assessment/Plan: Prior is a 34 year old female well-known to me with a history of hidradenitis bilateral axilla.  She now also has some chronic left breast pain as well as left upper extremity pain. Physical exam there is no obvious evidence of anatomical abnormalities to explain her symptoms.  We will start with ultrasound of the left axilla left breast to see if there is any potential pathology.  There is no need for surgical intervention at this time. Greater than 50% of the 30 minutes  visit was spent in counseling/coordination of care   Caroleen Hamman, MD [place] [facility]

## 2020-03-29 ENCOUNTER — Other Ambulatory Visit: Payer: Medicaid Other

## 2020-03-30 DIAGNOSIS — H52223 Regular astigmatism, bilateral: Secondary | ICD-10-CM | POA: Diagnosis not present

## 2020-03-30 DIAGNOSIS — H5213 Myopia, bilateral: Secondary | ICD-10-CM | POA: Diagnosis not present

## 2020-03-31 ENCOUNTER — Ambulatory Visit: Payer: Medicaid Other | Admitting: Nurse Practitioner

## 2020-04-01 ENCOUNTER — Ambulatory Visit: Payer: Medicaid Other | Admitting: Nurse Practitioner

## 2020-04-04 ENCOUNTER — Emergency Department (HOSPITAL_COMMUNITY)
Admission: EM | Admit: 2020-04-04 | Discharge: 2020-04-04 | Disposition: A | Discharge status: Home / Self Care | Payer: Medicaid Other | Attending: Emergency Medicine | Admitting: Emergency Medicine

## 2020-04-04 ENCOUNTER — Encounter (HOSPITAL_COMMUNITY): Payer: Self-pay

## 2020-04-04 ENCOUNTER — Other Ambulatory Visit: Payer: Self-pay

## 2020-04-04 DIAGNOSIS — R438 Other disturbances of smell and taste: Secondary | ICD-10-CM | POA: Diagnosis not present

## 2020-04-04 DIAGNOSIS — L750 Bromhidrosis: Secondary | ICD-10-CM

## 2020-04-04 DIAGNOSIS — B349 Viral infection, unspecified: Secondary | ICD-10-CM

## 2020-04-04 DIAGNOSIS — Z20822 Contact with and (suspected) exposure to covid-19: Secondary | ICD-10-CM

## 2020-04-04 DIAGNOSIS — Z882 Allergy status to sulfonamides status: Secondary | ICD-10-CM | POA: Diagnosis not present

## 2020-04-04 DIAGNOSIS — F17210 Nicotine dependence, cigarettes, uncomplicated: Secondary | ICD-10-CM | POA: Insufficient documentation

## 2020-04-04 DIAGNOSIS — R0981 Nasal congestion: Secondary | ICD-10-CM | POA: Diagnosis present

## 2020-04-04 LAB — SARS CORONAVIRUS 2 BY RT PCR (HOSPITAL ORDER, PERFORMED IN ~~LOC~~ HOSPITAL LAB): SARS Coronavirus 2: NEGATIVE

## 2020-04-04 NOTE — ED Provider Notes (Signed)
[place] DEPT Provider Note   CSN: [id number] Arrival date & time: 04/04/20  6945     History Chief Complaint  Patient presents with  . Nasal Congestion  . Shortness of Breath    Samantha Orr is a 34 y.o. female.  Presents to ER with myriad complaints.  Patient states that since March she feels like she has had increased nasal congestion and decreased sense of smell.  No cough, no difficulty breathing, no fevers.  Would like to be tested for COVID-19.  No known exposures.  Patient states that she has noticed bad body odor over the past couple weeks.  No new rash, no other illnesses besides the a forementioned congestion.  Reports that she had a surgery for hidradenitis suppurativa in February, no known complications.  Had followed up with a primary doctor and general surgery regarding her foul smell from her body and she was prescribed a course of doxycycline.  Reports that she has been compliant with this but has not noted any improvement.  HPI     Past Medical History:  Diagnosis Date  . Abscess   . Complication of anesthesia    hr/bp dropped after c/s 2015 in pacu   had spinal  . GERD (gastroesophageal reflux disease)    sometimes throws up when gas gets in her chest  . Shortness of breath dyspnea     Patient Active Problem List   Diagnosis Date Noted  . Obesity 11/03/2019  . History of abnormal cervical Pap smear 10/10/2018  . Abdominal bloating 10/10/2018  . Hidradenitis suppurativa   . Status post laparoscopic cholecystectomy 11/25/2014    Past Surgical History:  Procedure Laterality Date  . ABCESS DRAINAGE     BUTTOCK  . ABDOMINAL SURGERY     tubal, hernia and gallbladder  . CESAREAN SECTION    . CESAREAN SECTION  2012 and 2015  . CHOLECYSTECTOMY    . COLPOSCOPY  2015  . DILATION AND CURETTAGE OF UTERUS    . GALLBLADDER SURGERY  2016  . HERNIA REPAIR    . HYDRADENITIS EXCISION Bilateral 06/05/2016   Procedure: , right  axilla;  Surgeon: Jules Husbands, MD;  Location: [facility] ORS;  Service: General;  Laterality: Bilateral;  . HYDRADENITIS EXCISION Left 12/03/2019   Procedure: EXCISION HIDRADENITIS AXILLA Left;  Surgeon: Jules Husbands, MD;  Location: [facility] ORS;  Service: General;  Laterality: Left;  . INGUINAL HERNIA REPAIR  2016  . TUBAL LIGATION    . TUBAL LIGATION    . TUBAL LIGATION  2016     OB History    Gravida  1   Para      Term      Preterm      AB      Living        SAB      TAB      Ectopic      Multiple      Live Births              Family History  Problem Relation Age of Onset  . Hypertension Mother   . Breast cancer Maternal Aunt   . Uterine cancer Maternal Grandmother   . Diabetes Maternal Grandfather   . Breast cancer Maternal Grandfather        13    Social History   Tobacco Use  . Smoking status: Current Every Day Smoker    Packs/day: 0.50    Types: Cigarettes  .  Smokeless tobacco: Never Used  Vaping Use  . Vaping Use: Never used  Substance Use Topics  . Alcohol use: Yes    Alcohol/week: 6.0 standard drinks    Types: 6 Cans of beer per week    Comment: weekly  . Drug use: No    Home Medications Prior to Admission medications   Medication Sig Start Date End Date Taking? Authorizing Provider  doxycycline (VIBRAMYCIN) 100 MG capsule Take by mouth. 03/21/20 04/04/20  [provider]    Allergies    Bactrim [sulfamethoxazole-trimethoprim]  Review of Systems   Review of Systems  Constitutional: Negative for chills and fever.  HENT: Positive for rhinorrhea. Negative for ear pain and sore throat.   Eyes: Negative for pain and visual disturbance.  Respiratory: Negative for cough and shortness of breath.   Cardiovascular: Negative for chest pain and palpitations.  Gastrointestinal: Negative for abdominal pain and vomiting.  Genitourinary: Negative for dysuria and hematuria.  Musculoskeletal: Negative for arthralgias and back pain.  Skin:  Negative for color change and rash.  Neurological: Negative for seizures and syncope.  All other systems reviewed and are negative.   Physical Exam Updated Vital Signs BP (!) 123/96 (BP Location: Left Arm)   Pulse 86   Temp 99.2 F (37.3 C) (Oral)   Resp 16   Ht 5' 2"  (1.575 m)   Wt 82.1 kg   LMP 04/04/2020   SpO2 99%   BMI 33.11 kg/m   Physical Exam Vitals and nursing note reviewed.  Constitutional:      General: She is not in acute distress.    Appearance: She is well-developed.  HENT:     Head: Normocephalic and atraumatic.  Eyes:     Conjunctiva/sclera: Conjunctivae normal.  Cardiovascular:     Rate and Rhythm: Normal rate and regular rhythm.     Heart sounds: No murmur heard.   Pulmonary:     Effort: Pulmonary effort is normal. No respiratory distress.     Breath sounds: Normal breath sounds.  Abdominal:     Palpations: Abdomen is soft.     Tenderness: There is no abdominal tenderness.  Musculoskeletal:     Cervical back: Neck supple.  Skin:    General: Skin is warm and dry.     Capillary Refill: Capillary refill takes less than 2 seconds.     Comments: No erythema or abscess noted in bilateral axillary regions  Neurological:     General: No focal deficit present.     Mental Status: She is alert and oriented to person, place, and time.     ED Results / Procedures / Treatments   Labs (all labs ordered are listed, but only abnormal results are displayed) Labs Reviewed  SARS CORONAVIRUS 2 BY RT PCR (HOSPITAL ORDER, [facility])    EKG None  Radiology No results found.  Procedures Procedures (including critical care time)  Medications Ordered in ED Medications - No data to display  ED Course  I have reviewed the triage vital signs and the nursing notes.  Pertinent labs & imaging results that were available during my care of the patient were reviewed by me and considered in my medical decision making (see chart for  details).    MDM Rules/Calculators/A&P                          34 year old lady presenting to ER with concern for nasal congestion as well as increased  body odor.  Will test for COVID-19 however have relatively low clinical suspicion given the long duration of the symptoms.  Suspect more likely allergies.  Regarding concern for increased body odor, no appreciable abnormalities identified on skin exam, no obvious body odor while in room.  I recommended that she have follow-up again with her primary doctor, she reports that her primary doctor may be referring her to dermatology which I think is reasonable.  Given her overall well appearance and current physical exam, believe she can be discharged and managed in the outpatient setting.    After the discussed management above, the patient was determined to be safe for discharge.  The patient was in agreement with this plan and all questions regarding their care were answered.  ED return precautions were discussed and the patient will return to the ED with any significant worsening of condition.   Final Clinical Impression(s) / ED Diagnoses Final diagnoses:  Viral syndrome  Suspected COVID-19 virus infection  Body odor    Rx / DC Orders ED Discharge Orders    None       Lucrezia Starch, MD 04/04/20 1027

## 2020-04-04 NOTE — Discharge Instructions (Signed)
Recommend following up with primary doctor regarding concerns about bad odor.  If you develop chest pain, difficulty breathing, fever, other new concerning symptom, recommend return to ER for reassessment.

## 2020-04-04 NOTE — ED Triage Notes (Signed)
Patient states she went to a [facility] in [place] because when she would have sweat she would have a bad odor.  Patient denies a cough and SOB, but checked in with registration with that complaint. Patient states she gets tingling of her feet when she sweats and her "nose gets stopped up real bad."

## 2020-04-08 ENCOUNTER — Other Ambulatory Visit: Payer: Self-pay

## 2020-04-08 ENCOUNTER — Encounter: Payer: Self-pay | Admitting: Nurse Practitioner

## 2020-04-08 ENCOUNTER — Ambulatory Visit (INDEPENDENT_AMBULATORY_CARE_PROVIDER_SITE_OTHER): Payer: Medicaid Other | Admitting: Nurse Practitioner

## 2020-04-08 VITALS — BP 115/80 | HR 84 | Temp 98.2°F | Wt 187.6 lb

## 2020-04-08 DIAGNOSIS — L732 Hidradenitis suppurativa: Secondary | ICD-10-CM | POA: Diagnosis not present

## 2020-04-08 DIAGNOSIS — Z6836 Body mass index (BMI) 36.0-36.9, adult: Secondary | ICD-10-CM | POA: Diagnosis not present

## 2020-04-08 DIAGNOSIS — E6609 Other obesity due to excess calories: Secondary | ICD-10-CM

## 2020-04-08 NOTE — Assessment & Plan Note (Signed)
Recommended eating smaller high protein, low fat meals more frequently and exercising 30 mins a day 5 times a week with a goal of 10-15lb weight loss in the next 3 months. Patient voiced their understanding and motivation to adhere to these recommendations.  

## 2020-04-08 NOTE — Assessment & Plan Note (Signed)
Referral placed to dermatology per patient request, this is appropriate due to long standing issues with disease process and ongoing abscesses.  She is interested in maintenance therapy and options.

## 2020-04-08 NOTE — Patient Instructions (Signed)
Hidradenitis Suppurativa Hidradenitis suppurativa is a long-term (chronic) skin disease. It is similar to a severe form of acne, but it affects areas of the body where acne would be unusual, especially areas of the body where skin rubs against skin and becomes moist. These include:  Underarms.  Groin.  Genital area.  Buttocks.  Upper thighs.  Breasts. Hidradenitis suppurativa may start out as small lumps or pimples caused by blocked sweat glands or hair follicles. Pimples may develop into deep sores that break open (rupture) and drain pus. Over time, affected areas of skin may thicken and become scarred. This condition is rare and does not spread from person to person (non-contagious). What are the causes? The exact cause of this condition is not known. It may be related to:  Female and female hormones.  An overactive disease-fighting system (immune system). The immune system may over-react to blocked hair follicles or sweat glands and cause swelling and pus-filled sores. What increases the risk? You are more likely to develop this condition if you:  Are female.  Are 11-55 years old.  Have a family history of hidradenitis suppurativa.  Have a personal history of acne.  Are overweight.  Smoke.  Take the medicine lithium. What are the signs or symptoms? The first symptoms are usually painful bumps in the skin, similar to pimples. The condition may get worse over time (progress), or it may only cause mild symptoms. If the disease progresses, symptoms may include:  Skin bumps getting bigger and growing deeper into the skin.  Bumps rupturing and draining pus.  Itchy, infected skin.  Skin getting thicker and scarred.  Tunnels under the skin (fistulas) where pus drains from a bump.  Pain during daily activities, such as pain during walking if your groin area is affected.  Emotional problems, such as stress or depression. This condition may affect your appearance and your  ability or willingness to wear certain clothes or do certain activities. How is this diagnosed? This condition is diagnosed by a health care provider who specializes in skin diseases (dermatologist). You may be diagnosed based on:  Your symptoms and medical history.  A physical exam.  Testing a pus sample for infection.  Blood tests. How is this treated? Your treatment will depend on how severe your symptoms are. The same treatment will not work for everybody with this condition. You may need to try several treatments to find what works best for you. Treatment may include:  Cleaning and bandaging (dressing) your wounds as needed.  Lifestyle changes, such as new skin care routines.  Taking medicines, such as: ? Antibiotics. ? Acne medicines. ? Medicines to reduce the activity of the immune system. ? A diabetes medicine (metformin). ? Birth control pills, for women. ? Steroids to reduce swelling and pain.  Working with a mental health care provider, if you experience emotional distress due to this condition. If you have severe symptoms that do not get better with medicine, you may need surgery. Surgery may involve:  Using a laser to clear the skin and remove hair follicles.  Opening and draining deep sores.  Removing the areas of skin that are diseased and scarred. Follow these instructions at home: Medicines   Take over-the-counter and prescription medicines only as told by your health care provider.  If you were prescribed an antibiotic medicine, take it as told by your health care provider. Do not stop taking the antibiotic even if your condition improves. Skin care  If you have open wounds, cover   them with a clean dressing as told by your health care provider. Keep wounds clean by washing them gently with soap and water when you bathe.  Do not shave the areas where you get hidradenitis suppurativa.  Do not wear deodorant.  Wear loose-fitting clothes.  Try to avoid  getting overheated or sweaty. If you get sweaty or wet, change into clean, dry clothes as soon as you can.  To help relieve pain and itchiness, cover sore areas with a warm, clean washcloth (warm compress) for 5-10 minutes as often as needed.  If told by your health care provider, take a bleach bath twice a week: ? Fill your bathtub halfway with water. ? Pour in  cup of unscented household bleach. ? Soak in the tub for 5-10 minutes. ? Only soak from the neck down. Avoid water on your face and hair. ? Shower to rinse off the bleach from your skin. General instructions  Learn as much as you can about your disease so that you have an active role in your treatment. Work closely with your health care provider to find treatments that work for you.  If you are overweight, work with your health care provider to lose weight as recommended.  Do not use any products that contain nicotine or tobacco, such as cigarettes and e-cigarettes. If you need help quitting, ask your health care provider.  If you struggle with living with this condition, talk with your health care provider or work with a mental health care provider as recommended.  Keep all follow-up visits as told by your health care provider. This is important. Where to find more information  Hidradenitis Suppurativa Foundation, Inc.: https://www.hs-foundation.org/ Contact a health care provider if you have:  A flare-up of hidradenitis suppurativa.  A fever or chills.  Trouble controlling your symptoms at home.  Trouble doing your daily activities because of your symptoms.  Trouble dealing with emotional problems related to your condition. Summary  Hidradenitis suppurativa is a long-term (chronic) skin disease. It is similar to a severe form of acne, but it affects areas of the body where acne would be unusual.  The first symptoms are usually painful bumps in the skin, similar to pimples. The condition may get worse over time  (progress), or it may only cause mild symptoms.  If you have open wounds, cover them with a clean dressing as told by your health care provider. Keep wounds clean by washing them gently with soap and water when you bathe.  Besides skin care, treatment may include medicines, laser treatment, and surgery. This information is not intended to replace advice given to you by your health care provider. Make sure you discuss any questions you have with your health care provider. Document Revised: 10/09/2017 Document Reviewed: 10/09/2017 Elsevier Patient Education  2020 [employer]  

## 2020-04-08 NOTE — Progress Notes (Signed)
BP 115/80   Pulse 84   Temp 98.2 F (36.8 C) (Oral)   Wt 187 lb 9.6 oz (85.1 kg)   LMP 04/04/2020   SpO2 96%   BMI 34.31 kg/m    Subjective:    Patient ID: Samantha Orr, female    DOB: 01-Jan-1986, 34 y.o.   MRN: [id number]  HPI: Samantha Orr is a 34 y.o. female  Chief Complaint  Patient presents with  . skin odor    pt states everytime she sweats she has a skin odor, requesting referral to dermatology    HIDRADENITIS SUPPURTIVA: Has been seen by dermatology in past,2008 in [place]. Has been treated in past with abx and surgery. Has been "pretty much been getting surgeries since age 12". Uses home remedies, Epson bath and warm rags in microwave. Continues to have flares on occasion, currently to breast. Does report some discomfort to the area of knot -- has upcoming imaging on 04/14/20 which was ordered by general surgery. Previously has seen Dr. Dahlia Byes for this and last saw 03/23/20.  Reports she does not do well with abx in pill form, but did take whole Doxycycline prescription this time.    She reports getting a strong aroma from her skin, especially when she sweats -- feels it is like a strong "abscess" like smell.    Relevant past medical, surgical, family and social history reviewed and updated as indicated. Interim medical history since our last visit reviewed. Allergies and medications reviewed and updated.  Review of Systems  Constitutional: Negative for activity change, appetite change, diaphoresis, fatigue and fever.  Respiratory: Negative for cough, chest tightness and shortness of breath.   Cardiovascular: Negative for chest pain, palpitations and leg swelling.  Psychiatric/Behavioral: Negative.     Per HPI unless specifically indicated above     Objective:    BP 115/80   Pulse 84   Temp 98.2 F (36.8 C) (Oral)   Wt 187 lb 9.6 oz (85.1 kg)   LMP 04/04/2020   SpO2 96%   BMI 34.31 kg/m   Wt Readings from Last 3 Encounters:  04/08/20 187 lb 9.6 oz  (85.1 kg)  04/04/20 181 lb (82.1 kg)  03/23/20 181 lb (82.1 kg)    Physical Exam Vitals and nursing note reviewed.  Constitutional:      General: She is awake. She is not in acute distress.    Appearance: She is well-developed and well-groomed. She is obese. She is not ill-appearing.  HENT:     Head: Normocephalic.     Right Ear: Hearing normal.     Left Ear: Hearing normal.     Nose: Nose normal.     Mouth/Throat:     Mouth: Mucous membranes are moist.  Eyes:     General: Lids are normal.        Right eye: No discharge.        Left eye: No discharge.     Conjunctiva/sclera: Conjunctivae normal.     Pupils: Pupils are equal, round, and reactive to light.  Cardiovascular:     Rate and Rhythm: Normal rate and regular rhythm.     Heart sounds: Normal heart sounds. No murmur heard.  No gallop.   Pulmonary:     Effort: Pulmonary effort is normal. No accessory muscle usage or respiratory distress.     Breath sounds: Normal breath sounds.  Abdominal:     General: Bowel sounds are normal.     Palpations: Abdomen is soft.  Musculoskeletal:     Cervical back: Normal range of motion and neck supple.     Right lower leg: No edema.     Left lower leg: No edema.  Skin:    General: Skin is warm and dry.       Neurological:     Mental Status: She is alert and oriented to person, place, and time.  Psychiatric:        Attention and Perception: Attention normal.        Mood and Affect: Mood normal.        Speech: Speech normal.        Behavior: Behavior normal. Behavior is cooperative.        Thought Content: Thought content normal.     Results for orders placed or performed during the hospital encounter of 04/04/20  SARS Coronavirus 2 by RT PCR (hospital order, performed in [facility] hospital lab) Nasopharyngeal Nasopharyngeal Swab   Specimen: Nasopharyngeal Swab  Result Value Ref Range   SARS Coronavirus 2 NEGATIVE NEGATIVE      Assessment & Plan:   Problem List Items  Addressed This Visit      Musculoskeletal and Integument   Hidradenitis suppurativa - Primary    Referral placed to dermatology per patient request, this is appropriate due to long standing issues with disease process and ongoing abscesses.  She is interested in maintenance therapy and options.      Relevant Orders   Ambulatory referral to Dermatology     Other   Obesity    Recommended eating smaller high protein, low fat meals more frequently and exercising 30 mins a day 5 times a week with a goal of 10-15lb weight loss in the next 3 months. Patient voiced their understanding and motivation to adhere to these recommendations.           Follow up plan: Return if symptoms worsen or fail to improve.

## 2020-04-13 ENCOUNTER — Ambulatory Visit: Payer: Self-pay | Admitting: Surgery

## 2020-04-14 ENCOUNTER — Other Ambulatory Visit: Payer: Medicaid Other

## 2020-04-21 ENCOUNTER — Telehealth: Payer: Self-pay

## 2020-04-21 NOTE — Telephone Encounter (Signed)
Spoke with patient and she will call [facility] today to reschedule the mammogram and then call our office to schedule an appointment.

## 2020-04-25 ENCOUNTER — Ambulatory Visit: Payer: Medicaid Other | Admitting: Dermatology

## 2020-04-25 ENCOUNTER — Other Ambulatory Visit: Payer: Self-pay

## 2020-04-25 DIAGNOSIS — L74510 Primary focal hyperhidrosis, axilla: Secondary | ICD-10-CM | POA: Diagnosis not present

## 2020-04-25 DIAGNOSIS — L750 Bromhidrosis: Secondary | ICD-10-CM

## 2020-04-25 DIAGNOSIS — L732 Hidradenitis suppurativa: Secondary | ICD-10-CM | POA: Diagnosis not present

## 2020-04-25 DIAGNOSIS — L905 Scar conditions and fibrosis of skin: Secondary | ICD-10-CM | POA: Diagnosis not present

## 2020-04-25 DIAGNOSIS — R61 Generalized hyperhidrosis: Secondary | ICD-10-CM

## 2020-04-25 MED ORDER — CLINDAMYCIN PHOSPHATE 1 % EX SOLN: CUTANEOUS | 2 refills | Status: DC

## 2020-04-25 MED ORDER — MINOCYCLINE HCL 100 MG PO CAPS: 100.0000 mg | ORAL_CAPSULE | Freq: Two times a day (BID) | ORAL | 2 refills | Status: DC

## 2020-04-25 NOTE — Progress Notes (Signed)
   Follow-Up Visit   Subjective  Samantha Orr is a 34 y.o. female who presents for the following: hidradentitis suppurativa (pt c/o of painful flares hx of Hidradentis since age 49, she took Doxycyline prescribed by her PCP with a poor reponse, her last surgery was 5 months ago at the L axilla, pt had surgery in the bilateral groin years ago she is now flaring back up in the groin).  The following portions of the chart were reviewed this encounter and updated as appropriate:  Tobacco  Allergies  Meds  Problems  Med Hx  Surg Hx  Fam Hx     Review of Systems:  No other skin or systemic complaints except as noted in HPI or Assessment and Plan.  Objective  Well appearing patient in no apparent distress; mood and affect are within normal limits.  A focused examination was performed including face, axillae, groin, abdomen . Relevant physical exam findings are noted in the Assessment and Plan.  Objective  Left axilla, Right Abdomen (side) - Lower: Well healed scar   Objective  Left Abdomen (side) - Lower: Very tender nodules on the pubic area, groin, thigh and L breast areola  with scarring   Objective  Right Forearm - Anterior: Excessive sweating with odor   Assessment & Plan    Scar (2) Left axilla; Right Abdomen (side) - Lower Post Hidradenitis surgery   Hidradenitis suppurativa - Severe with current active Abscesses of abdomen and groin (pt declines I&D today); poorly responsive to recent oral doxycycline.  S/P surgical excisions of bilateral groin and Left axilla.  Left Abdomen (side) - Lower  Long Hx of Hidradenitis since the age of 37, hx of several surgeries some with a good response, some with a poor response   Advised Hidradenitis condition not curable, but treatable and goal is improved control.  Discussed treatment options for Hidradenitis:  topical meds; oral meds; antibiotics, Isotretinoin, Humira; surgery.   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   IAngelique Orr, CMA, am acting as scribe for Samantha Sans, MD .  Documentation: I have reviewed the above documentation for accuracy and completeness, and I agree with the above.  Samantha Sans, MD

## 2020-04-25 NOTE — Patient Instructions (Addendum)
Start over the counter CLN wash  For Pain try over the counter Voltaren cream

## 2020-04-26 ENCOUNTER — Encounter: Payer: Self-pay | Admitting: Dermatology

## 2020-05-09 ENCOUNTER — Ambulatory Visit: Payer: Medicaid Other | Admitting: Dermatology

## 2020-05-25 ENCOUNTER — Ambulatory Visit: Payer: Medicaid Other | Admitting: Dermatology

## 2020-06-02 ENCOUNTER — Telehealth: Payer: Self-pay

## 2020-06-02 NOTE — Telephone Encounter (Signed)
Letter mailed to call office to reschedule mammogram and follow up appointment with Dr.Pabon.

## 2020-06-21 DIAGNOSIS — Z20822 Contact with and (suspected) exposure to covid-19: Secondary | ICD-10-CM | POA: Diagnosis not present

## 2020-06-21 DIAGNOSIS — R61 Generalized hyperhidrosis: Secondary | ICD-10-CM | POA: Diagnosis not present

## 2020-06-21 DIAGNOSIS — L750 Bromhidrosis: Secondary | ICD-10-CM | POA: Diagnosis not present

## 2020-06-27 ENCOUNTER — Encounter: Payer: Self-pay | Admitting: *Deleted

## 2020-07-07 ENCOUNTER — Ambulatory Visit: Payer: Medicaid Other | Admitting: Unknown Physician Specialty

## 2020-07-11 DIAGNOSIS — N611 Abscess of the breast and nipple: Secondary | ICD-10-CM | POA: Diagnosis not present

## 2020-07-13 ENCOUNTER — Ambulatory Visit: Payer: Medicaid Other | Admitting: Dermatology

## 2020-07-21 ENCOUNTER — Ambulatory Visit: Payer: Self-pay | Admitting: Unknown Physician Specialty

## 2020-08-03 ENCOUNTER — Encounter: Payer: Self-pay | Admitting: Nurse Practitioner

## 2020-08-03 ENCOUNTER — Ambulatory Visit: Payer: Medicaid Other | Admitting: Nurse Practitioner

## 2020-08-03 ENCOUNTER — Other Ambulatory Visit: Payer: Self-pay

## 2020-08-03 VITALS — BP 121/78 | HR 77 | Temp 98.7°F | Resp 16 | Ht 62.0 in | Wt 196.0 lb

## 2020-08-03 DIAGNOSIS — E6609 Other obesity due to excess calories: Secondary | ICD-10-CM

## 2020-08-03 DIAGNOSIS — R062 Wheezing: Secondary | ICD-10-CM | POA: Diagnosis not present

## 2020-08-03 DIAGNOSIS — L732 Hidradenitis suppurativa: Secondary | ICD-10-CM | POA: Diagnosis not present

## 2020-08-03 DIAGNOSIS — Z6835 Body mass index (BMI) 35.0-35.9, adult: Secondary | ICD-10-CM

## 2020-08-03 DIAGNOSIS — F17210 Nicotine dependence, cigarettes, uncomplicated: Secondary | ICD-10-CM | POA: Insufficient documentation

## 2020-08-03 MED ORDER — CLINDAMYCIN PHOSPHATE 1 % EX SOLN: CUTANEOUS | 2 refills | Status: DC

## 2020-08-03 MED ORDER — ALBUTEROL SULFATE HFA 108 (90 BASE) MCG/ACT IN AERS: 2.0000 | INHALATION_SPRAY | Freq: Four times a day (QID) | RESPIRATORY_TRACT | 2 refills | Status: DC | PRN

## 2020-08-03 NOTE — Assessment & Plan Note (Signed)
Referral placed to dermatology at [facility] for second opinion per patient request, this is appropriate due to long standing issues with disease process and ongoing abscesses -- would like to see if other treatment options available.  She is interested in maintenance therapy and options.

## 2020-08-03 NOTE — Patient Instructions (Signed)
[address] [place], [place] [address] -- imaging location    Chronic Obstructive Pulmonary Disease Chronic obstructive pulmonary disease (COPD) is a long-term (chronic) lung problem. When you have COPD, it is hard for air to get in and out of your lungs. Usually the condition gets worse over time, and your lungs will never return to normal. There are things you can do to keep yourself as healthy as possible.  Your doctor may treat your condition with: ? Medicines. ? Oxygen. ? Lung surgery.  Your doctor may also recommend: ? Rehabilitation. This includes steps to make your body work better. It may involve a team of specialists. ? Quitting smoking, if you smoke. ? Exercise and changes to your diet. ? Comfort measures (palliative care). Follow these instructions at home: Medicines  Take over-the-counter and prescription medicines only as told by your doctor.  Talk to your doctor before taking any cough or allergy medicines. You may need to avoid medicines that cause your lungs to be dry. Lifestyle  If you smoke, stop. Smoking makes the problem worse. If you need help quitting, ask your doctor.  Avoid being around things that make your breathing worse. This may include smoke, chemicals, and fumes.  Stay active, but remember to rest as well.  Learn and use tips on how to relax.  Make sure you get enough sleep. Most adults need at least 7 hours of sleep every night.  Eat healthy foods. Eat smaller meals more often. Rest before meals. Controlled breathing Learn and use tips on how to control your breathing as told by your doctor. Try:  Breathing in (inhaling) through your nose for 1 second. Then, pucker your lips and breath out (exhale) through your lips for 2 seconds.  Putting one hand on your belly (abdomen). Breathe in slowly through your nose for 1 second. Your hand on your belly should move out. Pucker your lips and breathe out slowly through your lips. Your hand on your belly should  move in as you breathe out.  Controlled coughing Learn and use controlled coughing to clear mucus from your lungs. Follow these steps: 1. Lean your head a little forward. 2. Breathe in deeply. 3. Try to hold your breath for 3 seconds. 4. Keep your mouth slightly open while coughing 2 times. 5. Spit any mucus out into a tissue. 6. Rest and do the steps again 1 or 2 times as needed. General instructions  Make sure you get all the shots (vaccines) that your doctor recommends. Ask your doctor about a flu shot and a pneumonia shot.  Use oxygen therapy and pulmonary rehabilitation if told by your doctor. If you need home oxygen therapy, ask your doctor if you should buy a tool to measure your oxygen level (oximeter).  Make a COPD action plan with your doctor. This helps you to know what to do if you feel worse than usual.  Manage any other conditions you have as told by your doctor.  Avoid going outside when it is very hot, cold, or humid.  Avoid people who have a sickness you can catch (contagious).  Keep all follow-up visits as told by your doctor. This is important. Contact a doctor if:  You cough up more mucus than usual.  There is a change in the color or thickness of the mucus.  It is harder to breathe than usual.  Your breathing is faster than usual.  You have trouble sleeping.  You need to use your medicines more often than  usual.  You have trouble doing your normal activities such as getting dressed or walking around the house. Get help right away if:  You have shortness of breath while resting.  You have shortness of breath that stops you from: ? Being able to talk. ? Doing normal activities.  Your chest hurts for longer than 5 minutes.  Your skin color is more blue than usual.  Your pulse oximeter shows that you have low oxygen for longer than 5 minutes.  You have a fever.  You feel too tired to breathe normally. Summary  Chronic obstructive pulmonary  disease (COPD) is a long-term lung problem.  The way your lungs work will never return to normal. Usually the condition gets worse over time. There are things you can do to keep yourself as healthy as possible.  Take over-the-counter and prescription medicines only as told by your doctor.  If you smoke, stop. Smoking makes the problem worse. This information is not intended to replace advice given to you by your health care provider. Make sure you discuss any questions you have with your health care provider. Document Revised: 09/13/2017 Document Reviewed: 11/05/2016 Elsevier Patient Education  2020 [employer].

## 2020-08-03 NOTE — Assessment & Plan Note (Signed)
Recommended eating smaller high protein, low fat meals more frequently and exercising 30 mins a day 5 times a week with a goal of 10-15lb weight loss in the next 3 months. Patient voiced their understanding and motivation to adhere to these recommendations.  

## 2020-08-03 NOTE — Assessment & Plan Note (Signed)
I have recommended complete cessation of tobacco use. I have discussed various options available for assistance with tobacco cessation including over the counter methods (Nicotine gum, patch and lozenges). We also discussed prescription options (Chantix, Nicotine Inhaler / Nasal Spray). The patient is not interested in pursuing any prescription tobacco cessation options at this time.  Will try Albuterol inhaler as needed, script sent, suspect some underlying emphysema.  Plan on CXR to further assess lungs and spirometry next visit.

## 2020-08-03 NOTE — Progress Notes (Signed)
BP 121/78 (BP Location: Right Arm, Patient Position: Sitting, Cuff Size: Normal)   Pulse 77   Temp 98.7 F (37.1 C) (Oral)   Resp 16   Ht 5\' 2"  (1.575 m)   Wt 196 lb (88.9 kg)   SpO2 98%   BMI 35.85 kg/m    Subjective:    Patient ID: , female    DOB: September 30, 1986, 34 y.o.   MRN: [id number]  HPI: Samantha Orr is a 34 y.o. female  Chief Complaint  Patient presents with  . Follow-up   HIDRADENITIS SUPPURTIVA: Had visit with [facility], Dr. 31, on 04/25/20. Has been treated in past with abx and surgery. Has been "pretty much been getting surgeries since age 22". Uses home remedies, Epson bath and warm rags in microwave. Continues to have flares on occasion, currently to breast. Reports she does not do well with abx in pill form.  Dermatology ordered Seysara 60 MG BID last visit with them, and reported if not covered by insurance would order Minocycline + they started Clindamycin solution and Voltaren cream for pain.  Reports the Minocycline is not working.  She has had two visits at ER after this on 06/21/20 and 07/11/20 -- for hidradenitis suppurativa -- she reports being told in ER that this is emphysema related, but did not have imaging -- she smokes 1/2 PPD, started smoking at age 60.  Does endorse occasional wheezing and SOB.  Had recent abscess drained in ER on 07/11/20, she reports ongoing discomfort to area and now other areas popping up.  She reports the Clindamycin solution works well, but has no refills left on this.    She reports getting a strong aroma from her skin, especially when she sweats -- feels it is like a strong "abscess" like smell.    Relevant past medical, surgical, family and social history reviewed and updated as indicated. Interim medical history since our last visit reviewed. Allergies and medications reviewed and updated.  Review of Systems  Constitutional: Negative for activity change, appetite change, diaphoresis,  fatigue and fever.  Respiratory: Negative for cough, chest tightness and shortness of breath.   Cardiovascular: Negative for chest pain, palpitations and leg swelling.  Psychiatric/Behavioral: Negative.     Per HPI unless specifically indicated above     Objective:    BP 121/78 (BP Location: Right Arm, Patient Position: Sitting, Cuff Size: Normal)   Pulse 77   Temp 98.7 F (37.1 C) (Oral)   Resp 16   Ht 5\' 2"  (1.575 m)   Wt 196 lb (88.9 kg)   SpO2 98%   BMI 35.85 kg/m   Wt Readings from Last 3 Encounters:  08/03/20 196 lb (88.9 kg)  04/08/20 187 lb 9.6 oz (85.1 kg)  04/04/20 181 lb (82.1 kg)    Physical Exam Vitals and nursing note reviewed.  Constitutional:      General: She is awake. She is not in acute distress.    Appearance: She is well-developed and well-groomed. She is obese. She is not ill-appearing.  HENT:     Head: Normocephalic.     Right Ear: Hearing normal.     Left Ear: Hearing normal.     Nose: Nose normal.     Mouth/Throat:     Mouth: Mucous membranes are moist.  Eyes:     General: Lids are normal.        Right eye: No discharge.        Left eye: No discharge.  Conjunctiva/sclera: Conjunctivae normal.     Pupils: Pupils are equal, round, and reactive to light.  Cardiovascular:     Rate and Rhythm: Normal rate and regular rhythm.     Heart sounds: Normal heart sounds. No murmur heard.  No gallop.   Pulmonary:     Effort: Pulmonary effort is normal. No accessory muscle usage or respiratory distress.     Breath sounds: Normal breath sounds.  Abdominal:     General: Bowel sounds are normal.     Palpations: Abdomen is soft.  Musculoskeletal:     Cervical back: Normal range of motion and neck supple.     Right lower leg: No edema.     Left lower leg: No edema.  Skin:    General: Skin is warm and dry.          Comments: Small, approx 2 cm abscess noted under right axilla and left axilla.  Both intact and firm to touch, no warmth or erythema.    Neurological:     Mental Status: She is alert and oriented to person, place, and time.  Psychiatric:        Attention and Perception: Attention normal.        Mood and Affect: Mood normal.        Speech: Speech normal.        Behavior: Behavior normal. Behavior is cooperative.        Thought Content: Thought content normal.     Results for orders placed or performed during the hospital encounter of 04/04/20  SARS Coronavirus 2 by RT PCR (hospital order, performed in [facility] hospital lab) Nasopharyngeal Nasopharyngeal Swab   Specimen: Nasopharyngeal Swab  Result Value Ref Range   SARS Coronavirus 2 NEGATIVE NEGATIVE      Assessment & Plan:   Problem List Items Addressed This Visit      Musculoskeletal and Integument   Hidradenitis suppurativa - Primary    Referral placed to dermatology at [facility] for second opinion per patient request, this is appropriate due to long standing issues with disease process and ongoing abscesses -- would like to see if other treatment options available.  She is interested in maintenance therapy and options.      Relevant Medications   clindamycin (CLEOCIN T) 1 % external solution   Other Relevant Orders   Ambulatory referral to Dermatology     Other   Obesity    Recommended eating smaller high protein, low fat meals more frequently and exercising 30 mins a day 5 times a week with a goal of 10-15lb weight loss in the next 3 months. Patient voiced their understanding and motivation to adhere to these recommendations.       Nicotine dependence, cigarettes, uncomplicated    I have recommended complete cessation of tobacco use. I have discussed various options available for assistance with tobacco cessation including over the counter methods (Nicotine gum, patch and lozenges). We also discussed prescription options (Chantix, Nicotine Inhaler / Nasal Spray). The patient is not interested in pursuing any prescription tobacco cessation options at this  time.  Will try Albuterol inhaler as needed, script sent, suspect some underlying emphysema.  Plan on CXR to further assess lungs and spirometry next visit.        Relevant Orders   DG Chest 2 View    Other Visit Diagnoses    Wheezing       Will obtain CXR and trial Albuterol, suspect some underlying emphysema.  Spirometry next visit.  Relevant Orders   DG Chest 2 View       Follow up plan: Return in about 3 months (around 11/03/2020) for Skin and Smoking -- with spirometry.

## 2020-08-05 ENCOUNTER — Encounter: Payer: Self-pay | Admitting: Nurse Practitioner

## 2020-08-05 DIAGNOSIS — J449 Chronic obstructive pulmonary disease, unspecified: Secondary | ICD-10-CM | POA: Insufficient documentation

## 2020-11-07 ENCOUNTER — Ambulatory Visit: Payer: Medicaid Other | Admitting: Nurse Practitioner

## 2020-12-29 ENCOUNTER — Telehealth: Payer: Self-pay | Admitting: Nurse Practitioner

## 2020-12-29 NOTE — Telephone Encounter (Signed)
If abscess and leaking of drainage I would recommend she head back to general surgery, who she has seen before, first as not sure they will perform mammogram if open area present.  They may after area has healed.

## 2020-12-29 NOTE — Telephone Encounter (Signed)
Pt is calling to request a referral for [facility]. And pt reports that she abscesses in the nipple and it is leaking with pain again in the left breast. [phone]

## 2020-12-30 NOTE — Telephone Encounter (Signed)
Notified patient of Dr.Jolene's recommendations. Patient states she spoke with her general surgeon about the drainage she is currently experiencing. Patient states she now has excessive secretions coming from multiple areas including her thighs and other areas. Informed patient since she had not spoken with [facility], that she would need to contact their office and see what they recommend for patient as she is having drainage from a previous area that she has had excise before. Patient then stated she would like to scheduled an appointment with Dr.Jolene and patient was transferred to front desk staff to get a appointment scheduled.

## 2020-12-30 NOTE — Telephone Encounter (Signed)
Pt transferred to me from clinical to schedule an appt. She states that she was told that she needs to go to [facility] by her general surgeon. She wants a referral to them to get her mammogram.

## 2020-12-30 NOTE — Telephone Encounter (Signed)
Called pt to see about scheduling no answer phone hung up

## 2020-12-30 NOTE — Telephone Encounter (Signed)
Okay if Lauren or Clydie Braun have space today to see her.  Thank you.

## 2021-01-03 NOTE — Telephone Encounter (Signed)
Called pt to check in she states that she has 2 appt scheduled one on the 25th and the 30th and one of them are with [facility]

## 2021-01-03 NOTE — Telephone Encounter (Signed)
Noted  

## 2021-02-01 ENCOUNTER — Other Ambulatory Visit: Payer: Self-pay

## 2021-02-01 DIAGNOSIS — N6452 Nipple discharge: Secondary | ICD-10-CM

## 2021-03-09 DIAGNOSIS — N611 Abscess of the breast and nipple: Secondary | ICD-10-CM | POA: Diagnosis not present

## 2021-03-10 ENCOUNTER — Telehealth: Payer: Self-pay

## 2021-03-10 NOTE — Telephone Encounter (Signed)
Transition Care Management Unsuccessful Follow-up Telephone Call  Date of discharge and from where:  03/09/2021 from [facility]  Attempts:  1st Attempt  Reason for unsuccessful TCM follow-up call:  Left voice message     

## 2021-03-13 NOTE — Telephone Encounter (Signed)
Transition Care Management Unsuccessful Follow-up Telephone Call  Date of discharge and from where:  03/09/2021 from [facility]  Attempts:  2nd Attempt  Reason for unsuccessful TCM follow-up call:  Unable to leave message

## 2021-03-14 NOTE — Telephone Encounter (Signed)
Transition Care Management Unsuccessful Follow-up Telephone Call  Date of discharge and from where:  03/09/2021 from [facility]  Attempts:  3rd Attempt  Reason for unsuccessful TCM follow-up call:  Unable to reach patient     

## 2021-06-06 ENCOUNTER — Emergency Department: Payer: Medicaid Other

## 2021-06-06 ENCOUNTER — Encounter: Payer: Self-pay | Admitting: Emergency Medicine

## 2021-06-06 ENCOUNTER — Other Ambulatory Visit: Payer: Self-pay

## 2021-06-06 ENCOUNTER — Emergency Department
Admission: EM | Admit: 2021-06-06 | Discharge: 2021-06-06 | Disposition: A | Discharge status: Home / Self Care | Payer: Medicaid Other | Attending: Student in an Organized Health Care Education/Training Program | Admitting: Student in an Organized Health Care Education/Training Program

## 2021-06-06 DIAGNOSIS — N63 Unspecified lump in unspecified breast: Secondary | ICD-10-CM

## 2021-06-06 DIAGNOSIS — N644 Mastodynia: Secondary | ICD-10-CM | POA: Insufficient documentation

## 2021-06-06 DIAGNOSIS — F17210 Nicotine dependence, cigarettes, uncomplicated: Secondary | ICD-10-CM | POA: Insufficient documentation

## 2021-06-06 DIAGNOSIS — N611 Abscess of the breast and nipple: Secondary | ICD-10-CM | POA: Diagnosis not present

## 2021-06-06 DIAGNOSIS — J449 Chronic obstructive pulmonary disease, unspecified: Secondary | ICD-10-CM | POA: Diagnosis not present

## 2021-06-06 MED ORDER — PROBIOTIC 250 MG PO CAPS: 1.0000 | ORAL_CAPSULE | Freq: Two times a day (BID) | ORAL | 0 refills | Status: DC

## 2021-06-06 MED ORDER — CLINDAMYCIN PALMITATE HCL 75 MG/5ML PO SOLR: 300.0000 mg | Freq: Three times a day (TID) | ORAL | 0 refills | Status: AC

## 2021-06-06 NOTE — ED Notes (Signed)
See triage note  Presents with possible area to left nibble  States area this has had area lanced couple of times

## 2021-06-06 NOTE — ED Triage Notes (Signed)
Pt reports that she has an abscess on her left breast. She had a biopsy on the same breast a year ago. She gets abscess everywhere but she did not start getting one there until last year. She has an area on her areola where they did the biopsy that has small amount of purulent drainage but no redness. Her nipple appears to have a split in the middle that could have some white drainage.

## 2021-06-06 NOTE — ED Provider Notes (Signed)
[facility] Emergency Department Provider Note    Event Date/Time   First MD Initiated Contact with Patient 06/06/21 1042     (approximate)  I have reviewed the triage vital signs and the nursing notes.   HISTORY  Chief Complaint Abscess    HPI Samantha Orr is a 35 y.o. female with a history of recurrent breast abscesses as well as hidradenitis suppurativa currently on clindamycin presents to the ER for pain and drainage from the left nipple.  Reportedly had recent biopsy which was negative for malignancy.  Returns today because she feels like the abscesses recurred.  No fevers.  Describes episode as an itching sensation.  Past Medical History:  Diagnosis Date   Abscess    Complication of anesthesia    hr/bp dropped after c/s 2015 in pacu   had spinal   GERD (gastroesophageal reflux disease)    sometimes throws up when gas gets in her chest   Shortness of breath dyspnea    Family History  Problem Relation Age of Onset   Hypertension Mother    Breast cancer Maternal Aunt    Uterine cancer Maternal Grandmother    Diabetes Maternal Grandfather    Breast cancer Maternal Grandfather        71   Past Surgical History:  Procedure Laterality Date   ABCESS DRAINAGE     BUTTOCK   ABDOMINAL SURGERY     tubal, hernia and gallbladder   CESAREAN SECTION     CESAREAN SECTION  2012 and 2015   CHOLECYSTECTOMY     COLPOSCOPY  2015   DILATION AND CURETTAGE OF UTERUS     GALLBLADDER SURGERY  2016   HERNIA REPAIR     HYDRADENITIS EXCISION Bilateral 06/05/2016   Procedure: , right axilla;  Surgeon: Leafy Ro, MD;  Location: [facility] ORS;  Service: General;  Laterality: Bilateral;   HYDRADENITIS EXCISION Left 12/03/2019   Procedure: EXCISION HIDRADENITIS AXILLA Left;  Surgeon: Leafy Ro, MD;  Location: [facility] ORS;  Service: General;  Laterality: Left;   INGUINAL HERNIA REPAIR  2016   TUBAL LIGATION     TUBAL LIGATION     TUBAL LIGATION  2016    Patient Active Problem List   Diagnosis Date Noted   COPD (chronic obstructive pulmonary disease) (HCC) 08/05/2020   Nicotine dependence, cigarettes, uncomplicated 08/03/2020   Obesity 11/03/2019   History of abnormal cervical Pap smear 10/10/2018   Hidradenitis suppurativa    Status post laparoscopic cholecystectomy 11/25/2014      Prior to Admission medications   Medication Sig Start Date End Date Taking? Authorizing Provider  clindamycin (CLEOCIN) 75 MG/5ML solution Take 20 mLs (300 mg total) by mouth 3 (three) times daily for 5 days. 06/06/21 06/11/21 Yes Willy Eddy, MD  Saccharomyces boulardii (PROBIOTIC) 250 MG CAPS Take 1 capsule by mouth in the morning and at bedtime. 06/06/21  Yes Willy Eddy, MD  albuterol (VENTOLIN HFA) 108 (90 Base) MCG/ACT inhaler Inhale 2 puffs into the lungs every 6 (six) hours as needed for wheezing or shortness of breath. 08/03/20   Cannady, Corrie Dandy T, NP  minocycline (MINOCIN) 100 MG capsule Take 1 capsule (100 mg total) by mouth in the morning and at bedtime. Patient not taking: Reported on 08/03/2020 04/25/20   Deirdre Evener, MD    Allergies Bactrim [sulfamethoxazole-trimethoprim]    Social History Social History   Tobacco Use   Smoking status: Every Day    Packs/day: 0.50  Types: Cigarettes   Smokeless tobacco: Never  Vaping Use   Vaping Use: Never used  Substance Use Topics   Alcohol use: Yes    Alcohol/week: 6.0 standard drinks    Types: 6 Cans of beer per week    Comment: weekly   Drug use: No    Review of Systems Patient denies headaches, rhinorrhea, blurry vision, numbness, shortness of breath, chest pain, edema, cough, abdominal pain, nausea, vomiting, diarrhea, dysuria, fevers, rashes or hallucinations unless otherwise stated above in HPI. ____________________________________________   PHYSICAL EXAM:  VITAL SIGNS: Vitals:   06/06/21 1030  BP: 133/82  Pulse: 62  Resp: 20  Temp: 98 F (36.7 C)   SpO2: 99%    Constitutional: Alert and oriented.  Eyes: Conjunctivae are normal.  Head: Atraumatic. Nose: No congestion/rhinnorhea. Mouth/Throat: Mucous membranes are moist.   Neck: No stridor. Painless ROM.  Cardiovascular: Normal rate, regular rhythm. Grossly normal heart sounds.  Good peripheral circulation. Respiratory: Normal respiratory effort.  No retractions. Lungs CTAB. Gastrointestinal: Soft and nontender. No distention. No abdominal bruits. No CVA tenderness. Genitourinary:  Musculoskeletal: No lower extremity tenderness nor edema.  No joint effusions. Neurologic:  Normal speech and language. No gross focal neurologic deficits are appreciated. No facial droop Skin:  Breast exam performed with RN chaperone.  Skin is warm, dry and intact. No rash noted.  Small area behind the nipple and areola that is firm mobile, center of the left nipple was sunken with some white purulence but unable to express any purulence there does appear to be healing scar from biopsy lateral left of the areola.  No overlying warmth or cellulitis. Psychiatric: Mood and affect are normal. Speech and behavior are normal.  ____________________________________________   LABS (all labs ordered are listed, but only abnormal results are displayed)  No results found for this or any previous visit (from the past 24 hour(s)). ____________________________________________  EKG____________________________________________  RADIOLOGY  I personally reviewed all radiographic images ordered to evaluate for the above acute complaints and reviewed radiology reports and findings.  These findings were personally discussed with the patient.  Please see medical record for radiology report.  ____________________________________________   PROCEDURES  Procedure(s) performed:  Procedures    Critical Care performed: no ____________________________________________   INITIAL IMPRESSION / ASSESSMENT AND PLAN / ED  COURSE  Pertinent labs & imaging results that were available during my care of the patient were reviewed by me and considered in my medical decision making (see chart for details).   DDX: abscess, cellulitis, mastitis, mass  Samantha Orr is a 35 y.o. who presents to the ED with pain and discharge of left breast.  Has had extensive work-up for recurrent abscesses in the left side as well as history of hidradenitis suppurativa.  She is not septic appearing vital signs are stable.  Will order ultrasound to further evaluate.  Clinical Course as of 06/06/21 1211  Tue Jun 06, 2021  1158 Ultrasound does not show evidence of abscess or fluid collection.  Patient states she has not been taking clindamycin as she does not tolerate pills but would be able to tolerate solution.  We will send prescription for clindamycin solution to her pharmacy.  Patient also says that she supposed to follow-up with [facility] dermatology which I think is appropriate next step given her history of hidradenitis suppurativa.  States she is not able to tolerate doxycycline.  Discussed strict return precautions. [PR]    Clinical Course User Index [PR] Willy Eddy, MD  The patient was evaluated in Emergency Department today for the symptoms described in the history of present illness. He/she was evaluated in the context of the global COVID-19 pandemic, which necessitated consideration that the patient might be at risk for infection with the SARS-CoV-2 virus that causes COVID-19. Institutional protocols and algorithms that pertain to the evaluation of patients at risk for COVID-19 are in a state of rapid change based on information released by regulatory bodies including the CDC and federal and state organizations. These policies and algorithms were followed during the patient's care in the ED.  As part of my medical decision making, I reviewed the following data within the electronic MEDICAL RECORD NUMBER Nursing notes reviewed and  incorporated, Labs reviewed, notes from prior ED visits and [place] Controlled Substance Database   ____________________________________________   FINAL CLINICAL IMPRESSION(S) / ED DIAGNOSES  Final diagnoses:  Breast swelling      NEW MEDICATIONS STARTED DURING THIS VISIT:  New Prescriptions   CLINDAMYCIN (CLEOCIN) 75 MG/5ML SOLUTION    Take 20 mLs (300 mg total) by mouth 3 (three) times daily for 5 days.   SACCHAROMYCES BOULARDII (PROBIOTIC) 250 MG CAPS    Take 1 capsule by mouth in the morning and at bedtime.     Note:  This document was prepared using Dragon voice recognition software and may include unintentional dictation errors.    Willy Eddy, MD 06/06/21 [id number]

## 2021-06-15 IMAGING — MG DIGITAL DIAGNOSTIC BILAT W/ TOMO W/ CAD
6 of 10 series · 6 of 30 positions shown · non-contrast
Comparison: Previous exam(s).

CLINICAL DATA: 33-year-old female who initially presented to breast
imaging 11/12/2018 with spontaneous left nipple discharge which had
been occurring for approximately 6 months. She states that there is
a duct in the central nipple which elicits clear discharge, while
other ducts illicit thick white discharge. The nipple discharge has
continued over the course of the year and half. Over the [REDACTED], she
had a an inflamed and or infected sebaceous cyst at the left nipple
which ruptured.

EXAM:
DIGITAL DIAGNOSTIC BILATERAL MAMMOGRAM WITH CAD AND TOMO
ULTRASOUND LEFT BREAST

[L MLO synth-2D (1 of 2)]
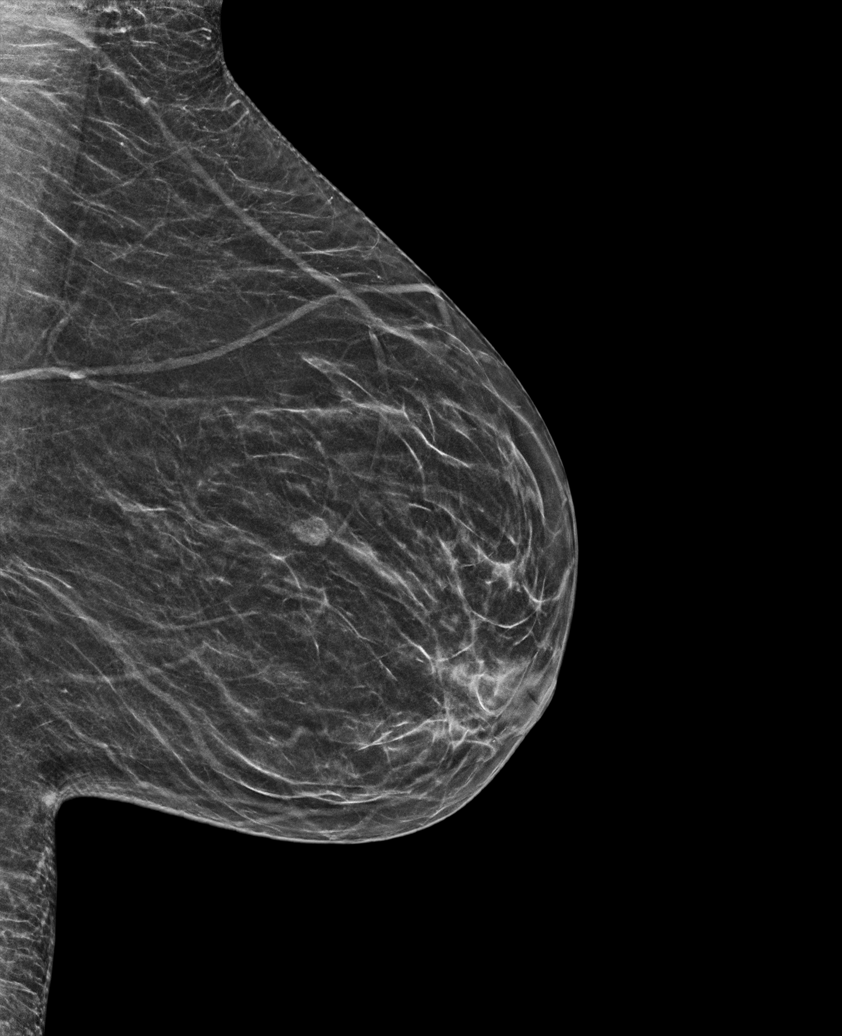

[L CC synth-2D]
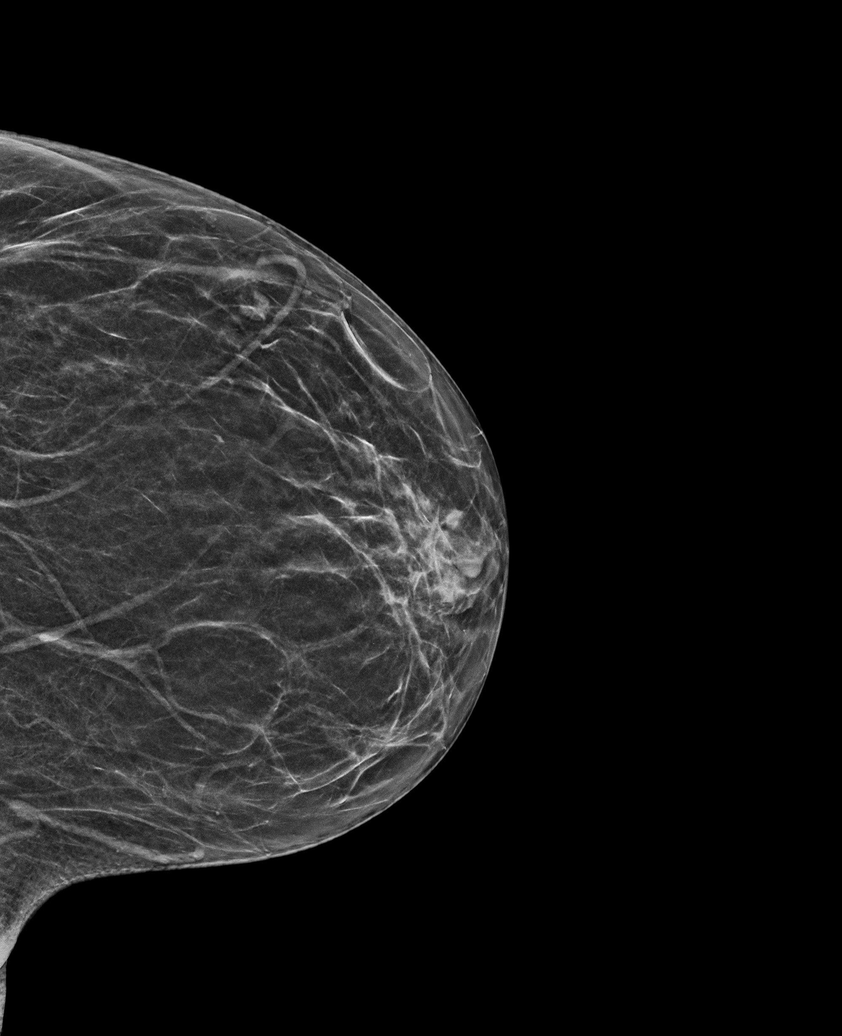

[R MLO synth-2D]
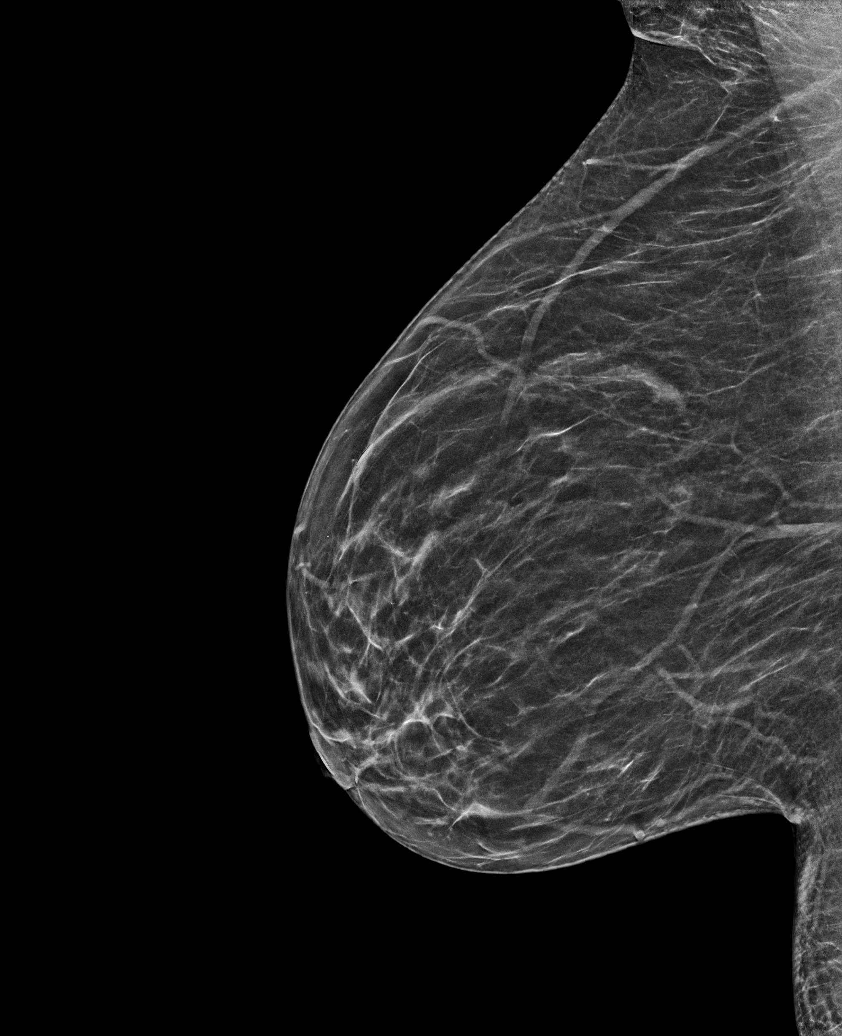

[L MLO synth-2D (2 of 2)]
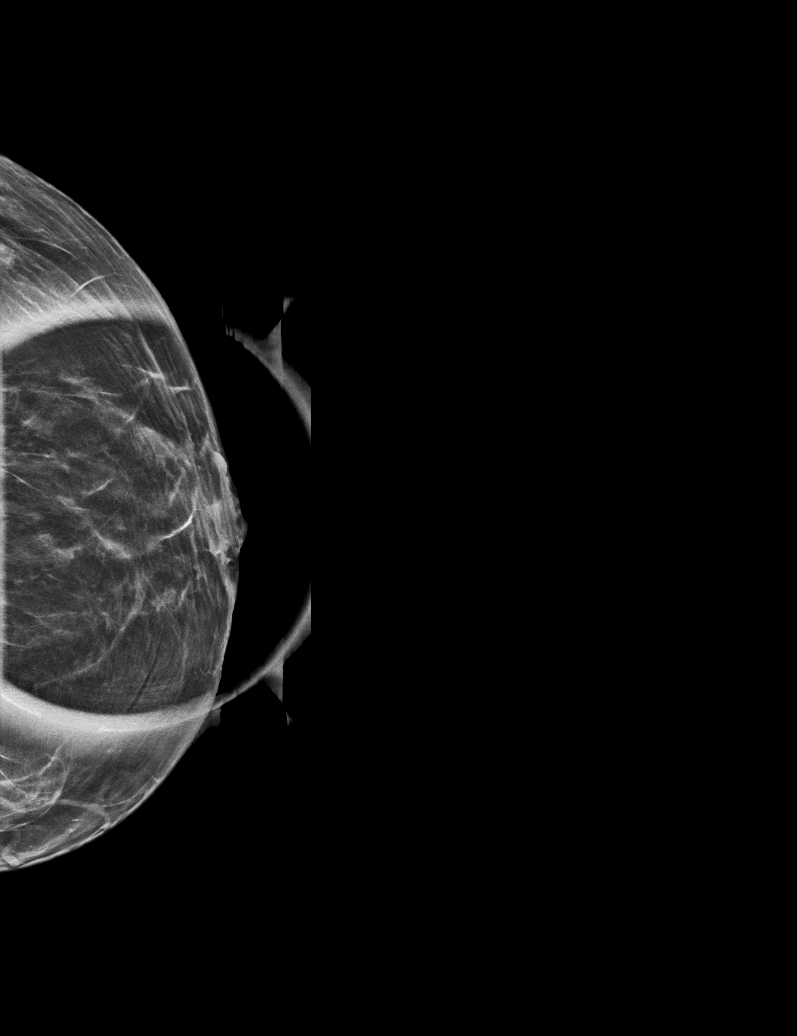

[R CC synth-2D]
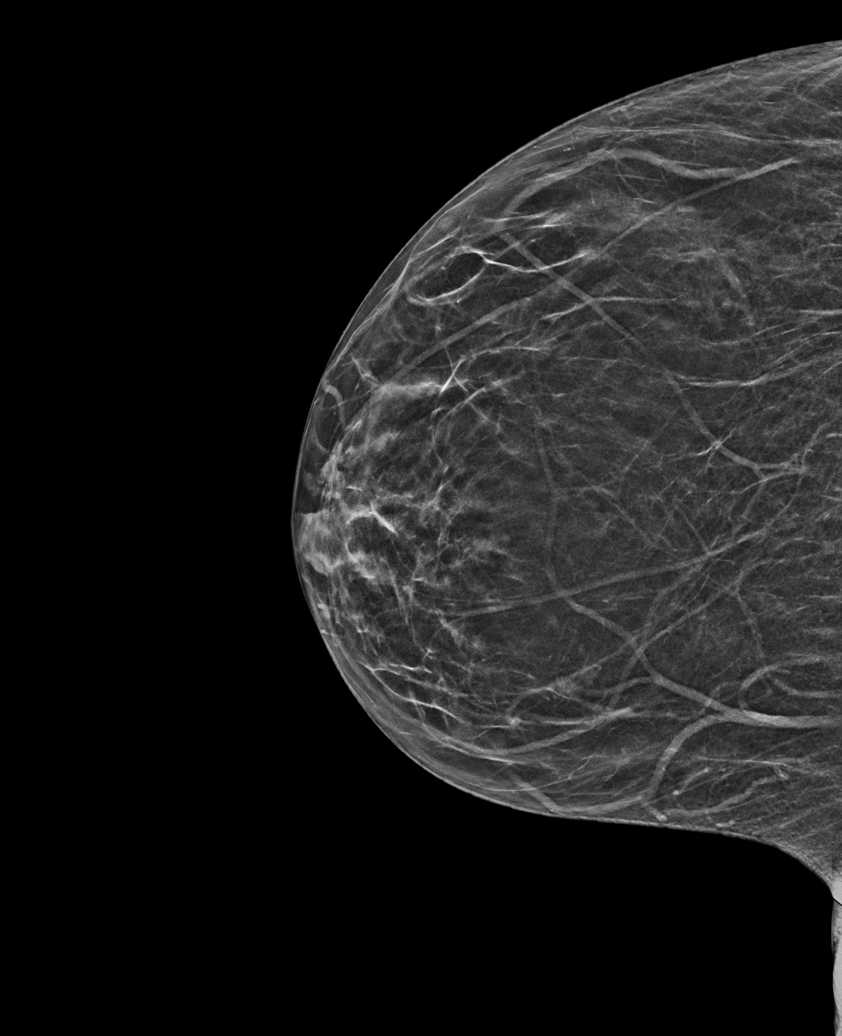

[L MLO tomo · tomo slice 25/49.0]
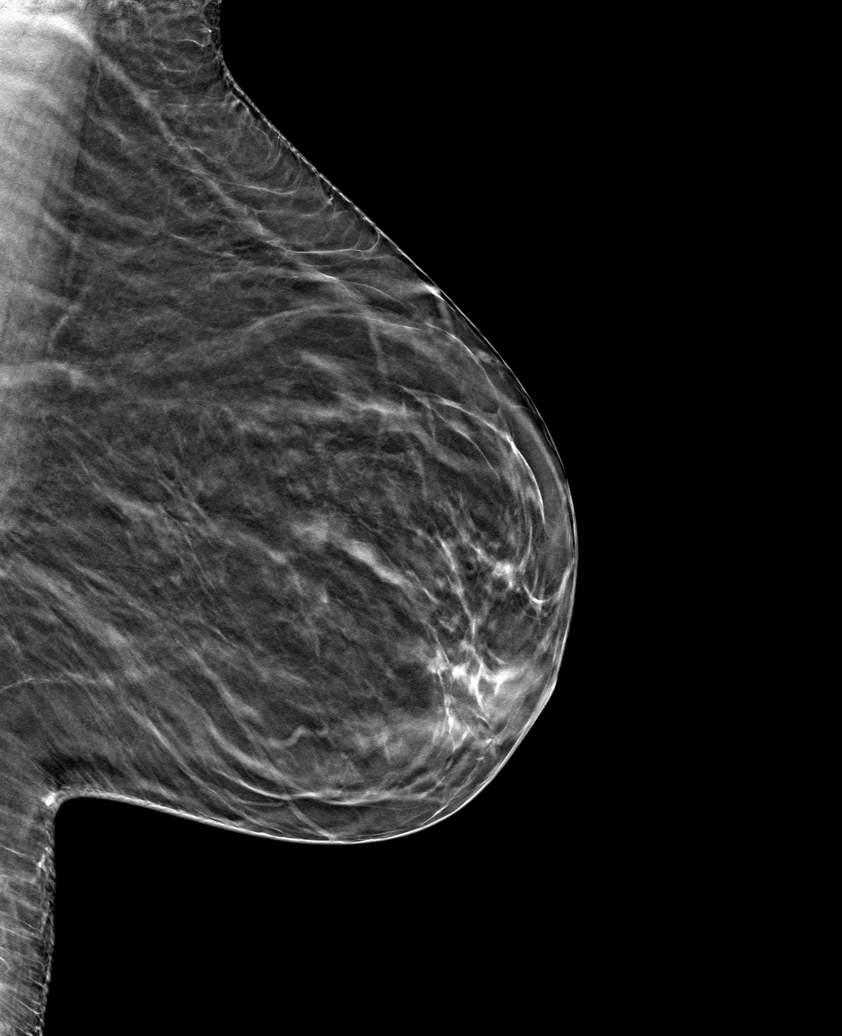

[6 of 30 positions shown; findings below may reference images not displayed]

ACR Breast Density Category b: There are scattered areas of
fibroglandular density.
FINDINGS: No suspicious calcifications, masses or areas of distortion are seen
in the bilateral breasts.

Mammographic images were processed with CAD.

On physical exam, there is slit like central inversion of the
nipple, which the patient feels is new and started before the time
of the nipple abscess she had last [REDACTED]. There are a few prominent
protruding duct ostia which appear to have a head of underlying
white discharge, though I couldn't express any on exam today.

Targeted ultrasound is performed of the retroareolar left breast,
showing a hypoechoic oval lesion within the nipple measuring 6 mm,
which extend into a duct slightly beyond the nipple. This may
represent the thick debris which the patient is able to manually
express. No blood flow is seen within this hypoechoic structure. No
suspicious retroareolar parenchymal lesions are identified.
IMPRESSION: 1. There are no mammographic or definite targeted sonographic
findings to explain the patient's left nipple discharge. There is a
hypoechoic structure within the nipple which may represent
inflammatory debris, though a papilloma is not excluded.

2.  No mammographic evidence of malignancy in the bilateral breasts.

RECOMMENDATION:
1. Surgical consultation and bilateral breast MRI is recommended for
further evaluation of the spontaneous left nipple discharge, and to
determine if there is enhancement within the nipple corresponding to
the lesion seen on ultrasound.

2. If surgery is not performed on the left breast, six-month
follow-up left breast ultrasound is recommended to monitor the
likely benign lesion within the left nipple.

I have discussed the findings and recommendations with the patient.
If applicable, a reminder letter will be sent to the patient
regarding the next appointment.

BI-RADS CATEGORY  3: Probably benign.

## 2021-07-20 ENCOUNTER — Ambulatory Visit: Payer: Medicaid Other | Admitting: Nurse Practitioner

## 2021-07-20 ENCOUNTER — Encounter: Payer: Self-pay | Admitting: Nurse Practitioner

## 2021-07-20 ENCOUNTER — Other Ambulatory Visit: Payer: Self-pay

## 2021-07-20 VITALS — BP 129/89 | HR 93 | Temp 98.4°F | Wt 190.6 lb

## 2021-07-20 DIAGNOSIS — L732 Hidradenitis suppurativa: Secondary | ICD-10-CM | POA: Diagnosis not present

## 2021-07-20 MED ORDER — LORATADINE 10 MG PO TABS: 10.0000 mg | ORAL_TABLET | Freq: Every day | ORAL | 11 refills | Status: DC

## 2021-07-20 MED ORDER — CLINDAMYCIN HCL 150 MG PO CAPS: ORAL_CAPSULE | ORAL | 0 refills | Status: DC

## 2021-07-20 MED ORDER — MUPIROCIN 2 % EX OINT: 1.0000 "application " | TOPICAL_OINTMENT | Freq: Two times a day (BID) | CUTANEOUS | 0 refills | Status: DC

## 2021-07-20 MED ORDER — FLUTICASONE PROPIONATE 50 MCG/ACT NA SUSP: 2.0000 | Freq: Every day | NASAL | 6 refills | Status: DC

## 2021-07-20 NOTE — Progress Notes (Addendum)
BP 129/89   Pulse 93   Temp 98.4 F (36.9 C) (Oral)   Wt 190 lb 9.6 oz (86.5 kg)   SpO2 98%   BMI 34.86 kg/m    Subjective:    Patient ID: Samantha Orr, female    DOB: [birth year], 35 y.o.   MRN: [id number]  HPI: Samantha Orr is a 35 y.o. female  Chief Complaint  Patient presents with   Abscess    Patient states she has a abscess in left breast in the nipple area. Patient states she first noticed it about 3 days ago and she says she has been taking her clindamycin. Patient states the area is painful and it is currently draining and state this time around it was a different type of liquid (bloody-clear with yellow tint). Patient states she is feel the medication is helping.    SKIN INFECTION Presents for assessment of abscess to left breast -- has had breast biopsy to area which returned negative.  She missed recent mammogram.  Placed referral to [facility] dermatology last visit, she did not attend.  She took two Clindamycin that she had leftover at home -- this has worked well for her in past.   Duration: days Location: left breast History of trauma in area: no Pain: yes Quality: yes Severity: 10/10 Redness: yes Swelling: yes Oozing: yes Pus: no Fevers: no Nausea/vomiting: no Status: stable Treatments attempted:antibiotics  Tetanus: UTD   Relevant past medical, surgical, family and social history reviewed and updated as indicated. Interim medical history since our last visit reviewed. Allergies and medications reviewed and updated.  Review of Systems  Constitutional:  Negative for activity change, appetite change, diaphoresis, fatigue and fever.  Respiratory:  Negative for cough, chest tightness and shortness of breath.   Cardiovascular:  Negative for chest pain, palpitations and leg swelling.  Skin:  Positive for wound.  Psychiatric/Behavioral: Negative.     Per HPI unless specifically indicated above     Objective:    BP 129/89   Pulse 93   Temp 98.4 F  (36.9 C) (Oral)   Wt 190 lb 9.6 oz (86.5 kg)   SpO2 98%   BMI 34.86 kg/m   Wt Readings from Last 3 Encounters:  07/20/21 190 lb 9.6 oz (86.5 kg)  06/06/21 190 lb (86.2 kg)  08/03/20 196 lb (88.9 kg)    Physical Exam Vitals and nursing note reviewed.  Constitutional:      General: She is awake. She is not in acute distress.    Appearance: She is well-developed and well-groomed. She is obese. She is not ill-appearing.  HENT:     Head: Normocephalic.     Right Ear: Hearing normal.     Left Ear: Hearing normal.     Nose: Nose normal.     Mouth/Throat:     Mouth: Mucous membranes are moist.  Eyes:     General: Lids are normal.        Right eye: No discharge.        Left eye: No discharge.     Conjunctiva/sclera: Conjunctivae normal.     Pupils: Pupils are equal, round, and reactive to light.  Cardiovascular:     Rate and Rhythm: Normal rate and regular rhythm.     Heart sounds: Normal heart sounds. No murmur heard.   No gallop.  Pulmonary:     Effort: Pulmonary effort is normal. No accessory muscle usage or respiratory distress.     Breath sounds: Normal breath  sounds.  Abdominal:     General: Bowel sounds are normal.     Palpations: Abdomen is soft.  Musculoskeletal:     Cervical back: Normal range of motion and neck supple.     Right lower leg: No edema.     Left lower leg: No edema.  Skin:    General: Skin is warm and dry.       Neurological:     Mental Status: She is alert and oriented to person, place, and time.  Psychiatric:        Attention and Perception: Attention normal.        Mood and Affect: Mood normal.        Speech: Speech normal.        Behavior: Behavior normal. Behavior is cooperative.        Thought Content: Thought content normal.    Results for orders placed or performed during the hospital encounter of 04/04/20  SARS Coronavirus 2 by RT PCR (hospital order, performed in [facility] hospital lab) Nasopharyngeal Nasopharyngeal Swab    Specimen: Nasopharyngeal Swab  Result Value Ref Range   SARS Coronavirus 2 NEGATIVE NEGATIVE      Assessment & Plan:   Problem List Items Addressed This Visit       Musculoskeletal and Integument   Hidradenitis suppurativa - Primary    Referral placed to dermatology at [facility] for second opinion per patient request, this is appropriate due to long standing issues with disease process and ongoing abscesses -- would like to see if other treatment options available -- ?Humira.  She is interested in maintenance therapy and options.  Referral to Dr. Everlene Farrier, general surgery, for assessment of current abscess.  Refill on Clindamycin sent and sent Mupirocin ointment to apply to wound.  Recommend she cleanse wound with Hibiclens daily and monitor closely, if worsening return to office.      Relevant Medications   clindamycin (CLEOCIN) 150 MG capsule   mupirocin ointment (BACTROBAN) 2 %   Other Relevant Orders   Ambulatory referral to Dermatology   Ambulatory referral to General Surgery     Follow up plan: Return if symptoms worsen or fail to improve.

## 2021-07-20 NOTE — Assessment & Plan Note (Addendum)
Referral placed to dermatology at [facility] for second opinion per patient request, this is appropriate due to long standing issues with disease process and ongoing abscesses -- would like to see if other treatment options available -- ?Humira.  She is interested in maintenance therapy and options.  Referral to Dr. Everlene Farrier, general surgery, for assessment of current abscess.  Refill on Clindamycin sent and sent Mupirocin ointment to apply to wound.  Recommend she cleanse wound with Hibiclens daily and monitor closely, if worsening return to office.

## 2021-07-20 NOTE — Patient Instructions (Signed)
Hidradenitis Suppurativa Hidradenitis suppurativa is a long-term (chronic) skin disease. It is similar to a severe form of acne, but it affects areas of the body where acne would be unusual, especially areas of the body where skin rubs against skin and becomes moist. These include: Underarms. Groin. Genital area. Buttocks. Upper thighs. Breasts. Hidradenitis suppurativa may start out as small lumps or pimples caused by blocked sweat glands or hair follicles. Pimples may develop into deep sores that break open (rupture) and drain pus. Over time, affected areas of skin may thicken and become scarred. This condition is rare and does not spread from person to person (non-contagious). What are the causes? The exact cause of this condition is not known. It may be related to: Female and female hormones. An overactive disease-fighting system (immune system). The immune system may over-react to blocked hair follicles or sweat glands and cause swelling and pus-filled sores. What increases the risk? You are more likely to develop this condition if you: Are female. Are 11-55 years old. Have a family history of hidradenitis suppurativa. Have a personal history of acne. Are overweight. Smoke. Take the medicine lithium. What are the signs or symptoms? The first symptoms are usually painful bumps in the skin, similar to pimples. The condition may get worse over time (progress), or it may only cause mild symptoms. If the disease progresses, symptoms may include: Skin bumps getting bigger and growing deeper into the skin. Bumps rupturing and draining pus. Itchy, infected skin. Skin getting thicker and scarred. Tunnels under the skin (fistulas) where pus drains from a bump. Pain during daily activities, such as pain during walking if your groin area is affected. Emotional problems, such as stress or depression. This condition may affect your appearance and your ability or willingness to wear certain clothes  or do certain activities. How is this diagnosed? This condition is diagnosed by a health care provider who specializes in skin diseases (dermatologist). You may be diagnosed based on: Your symptoms and medical history. A physical exam. Testing a pus sample for infection. Blood tests. How is this treated? Your treatment will depend on how severe your symptoms are. The same treatment will not work for everybody with this condition. You may need to try several treatments to find what works best for you. Treatment may include: Cleaning and bandaging (dressing) your wounds as needed. Lifestyle changes, such as new skin care routines. Taking medicines, such as: Antibiotics. Acne medicines. Medicines to reduce the activity of the immune system. A diabetes medicine (metformin). Birth control pills, for women. Steroids to reduce swelling and pain. Working with a mental health care provider, if you experience emotional distress due to this condition. If you have severe symptoms that do not get better with medicine, you may need surgery. Surgery may involve: Using a laser to clear the skin and remove hair follicles. Opening and draining deep sores. Removing the areas of skin that are diseased and scarred. Follow these instructions at home: Medicines  Take over-the-counter and prescription medicines only as told by your health care provider. If you were prescribed an antibiotic medicine, take it as told by your health care provider. Do not stop taking the antibiotic even if your condition improves. Skin care If you have open wounds, cover them with a clean dressing as told by your health care provider. Keep wounds clean by washing them gently with soap and water when you bathe. Do not shave the areas where you get hidradenitis suppurativa. Do not wear deodorant. Wear loose-fitting   clothes. Try to avoid getting overheated or sweaty. If you get sweaty or wet, change into clean, dry clothes as soon  as you can. To help relieve pain and itchiness, cover sore areas with a warm, clean washcloth (warm compress) for 5-10 minutes as often as needed. If told by your health care provider, take a bleach bath twice a week: Fill your bathtub halfway with water. Pour in  cup of unscented household bleach. Soak in the tub for 5-10 minutes. Only soak from the neck down. Avoid water on your face and hair. Shower to rinse off the bleach from your skin. General instructions Learn as much as you can about your disease so that you have an active role in your treatment. Work closely with your health care provider to find treatments that work for you. If you are overweight, work with your health care provider to lose weight as recommended. Do not use any products that contain nicotine or tobacco, such as cigarettes and e-cigarettes. If you need help quitting, ask your health care provider. If you struggle with living with this condition, talk with your health care provider or work with a mental health care provider as recommended. Keep all follow-up visits as told by your health care provider. This is important. Where to find more information Hidradenitis Suppurativa Foundation, Inc.: https://www.hs-foundation.org/ American Academy of Dermatology: https://www.aad.org Contact a health care provider if you have: A flare-up of hidradenitis suppurativa. A fever or chills. Trouble controlling your symptoms at home. Trouble doing your daily activities because of your symptoms. Trouble dealing with emotional problems related to your condition. Summary Hidradenitis suppurativa is a long-term (chronic) skin disease. It is similar to a severe form of acne, but it affects areas of the body where acne would be unusual. The first symptoms are usually painful bumps in the skin, similar to pimples. The condition may only cause mild symptoms, or it may get worse over time (progress). If you have open wounds, cover them  with a clean dressing as told by your health care provider. Keep wounds clean by washing them gently with soap and water when you bathe. Besides skin care, treatment may include medicines, laser treatment, and surgery. This information is not intended to replace advice given to you by your health care provider. Make sure you discuss any questions you have with your health care provider. Document Revised: 07/26/2020 Document Reviewed: 07/26/2020 Elsevier Patient Education  2022 [employer]  

## 2021-08-02 ENCOUNTER — Ambulatory Visit: Payer: Medicaid Other | Admitting: Surgery

## 2021-08-09 ENCOUNTER — Ambulatory Visit: Payer: Medicaid Other | Admitting: Surgery

## 2021-08-09 ENCOUNTER — Encounter: Payer: Self-pay | Admitting: Surgery

## 2021-08-09 ENCOUNTER — Other Ambulatory Visit: Payer: Self-pay

## 2021-08-09 VITALS — BP 111/65 | HR 70 | Temp 98.6°F | Ht 61.0 in | Wt 190.0 lb

## 2021-08-09 DIAGNOSIS — N6452 Nipple discharge: Secondary | ICD-10-CM | POA: Diagnosis not present

## 2021-08-09 DIAGNOSIS — L732 Hidradenitis suppurativa: Secondary | ICD-10-CM | POA: Diagnosis not present

## 2021-08-09 NOTE — Patient Instructions (Addendum)
I have placed an order for you to have a Mammogram. I will call you with an appointment.    If you have any concerns or questions, please feel free to call our office.   Hidradenitis Suppurativa Hidradenitis suppurativa is a long-term (chronic) skin disease. It is similar to a severe form of acne, but it affects areas of the body where acne would be unusual, especially areas of the body where skin rubs against skin and becomes moist. These include: Underarms. Groin. Genital area. Buttocks. Upper thighs. Breasts. Hidradenitis suppurativa may start out as small lumps or pimples caused by blocked sweat glands or hair follicles. Pimples may develop into deep sores that break open (rupture) and drain pus. Over time, affected areas of skin may thicken and become scarred. This condition is rare and does not spread from person to person (non-contagious). What are the causes? The exact cause of this condition is not known. It may be related to: Female and female hormones. An overactive disease-fighting system (immune system). The immune system may over-react to blocked hair follicles or sweat glands and cause swelling and pus-filled sores. What increases the risk? You are more likely to develop this condition if you: Are female. Are 2-24 years old. Have a family history of hidradenitis suppurativa. Have a personal history of acne. Are overweight. Smoke. Take the medicine lithium. What are the signs or symptoms? The first symptoms are usually painful bumps in the skin, similar to pimples. The condition may get worse over time (progress), or it may only cause mild symptoms. If the disease progresses, symptoms may include: Skin bumps getting bigger and growing deeper into the skin. Bumps rupturing and draining pus. Itchy, infected skin. Skin getting thicker and scarred. Tunnels under the skin (fistulas) where pus drains from a bump. Pain during daily activities, such as pain during walking if  your groin area is affected. Emotional problems, such as stress or depression. This condition may affect your appearance and your ability or willingness to wear certain clothes or do certain activities. How is this diagnosed? This condition is diagnosed by a health care provider who specializes in skin diseases (dermatologist). You may be diagnosed based on: Your symptoms and medical history. A physical exam. Testing a pus sample for infection. Blood tests. How is this treated? Your treatment will depend on how severe your symptoms are. The same treatment will not work for everybody with this condition. You may need to try several treatments to find what works best for you. Treatment may include: Cleaning and bandaging (dressing) your wounds as needed. Lifestyle changes, such as new skin care routines. Taking medicines, such as: Antibiotics. Acne medicines. Medicines to reduce the activity of the immune system. A diabetes medicine (metformin). Birth control pills, for women. Steroids to reduce swelling and pain. Working with a mental health care provider, if you experience emotional distress due to this condition. If you have severe symptoms that do not get better with medicine, you may need surgery. Surgery may involve: Using a laser to clear the skin and remove hair follicles. Opening and draining deep sores. Removing the areas of skin that are diseased and scarred. Follow these instructions at home: Medicines  Take over-the-counter and prescription medicines only as told by your health care provider. If you were prescribed an antibiotic medicine, take it as told by your health care provider. Do not stop taking the antibiotic even if your condition improves. Skin care If you have open wounds, cover them with a clean dressing  as told by your health care provider. Keep wounds clean by washing them gently with soap and water when you bathe. Do not shave the areas where you get  hidradenitis suppurativa. Do not wear deodorant. Wear loose-fitting clothes. Try to avoid getting overheated or sweaty. If you get sweaty or wet, change into clean, dry clothes as soon as you can. To help relieve pain and itchiness, cover sore areas with a warm, clean washcloth (warm compress) for 5-10 minutes as often as needed. If told by your health care provider, take a bleach bath twice a week: Fill your bathtub halfway with water. Pour in  cup of unscented household bleach. Soak in the tub for 5-10 minutes. Only soak from the neck down. Avoid water on your face and hair. Shower to rinse off the bleach from your skin. General instructions Learn as much as you can about your disease so that you have an active role in your treatment. Work closely with your health care provider to find treatments that work for you. If you are overweight, work with your health care provider to lose weight as recommended. Do not use any products that contain nicotine or tobacco, such as cigarettes and e-cigarettes. If you need help quitting, ask your health care provider. If you struggle with living with this condition, talk with your health care provider or work with a mental health care provider as recommended. Keep all follow-up visits as told by your health care provider. This is important. Where to find more information Hidradenitis Suppurativa Foundation, Inc.: https://www.hs-foundation.org/ American Academy of Dermatology: Instant[employer] Contact a health care provider if you have: A flare-up of hidradenitis suppurativa. A fever or chills. Trouble controlling your symptoms at home. Trouble doing your daily activities because of your symptoms. Trouble dealing with emotional problems related to your condition. Summary Hidradenitis suppurativa is a long-term (chronic) skin disease. It is similar to a severe form of acne, but it affects areas of the body where acne would be unusual. The first  symptoms are usually painful bumps in the skin, similar to pimples. The condition may only cause mild symptoms, or it may get worse over time (progress). If you have open wounds, cover them with a clean dressing as told by your health care provider. Keep wounds clean by washing them gently with soap and water when you bathe. Besides skin care, treatment may include medicines, laser treatment, and surgery. This information is not intended to replace advice given to you by your health care provider. Make sure you discuss any questions you have with your health care provider. Document Revised: 07/26/2020 Document Reviewed: 07/26/2020 Elsevier Patient Education  2022 [employer].

## 2021-08-11 NOTE — Progress Notes (Signed)
Outpatient Surgical Follow Up  08/11/2021  Samantha Orr is an 35 y.o. female.   Chief Complaint  Patient presents with   Follow-up    Hidradenitis left inner thigh    HPI: This Icenhower is a 35 year old female well-known to me with history of hidradenitis.  She was recently seen at primary care and was prescribed clindamycin.  She feels better.  She does have hidradenitis in the left inner thigh with some intermittent drainage and pain pain is moderate intermittent and sharp.  She also has a chronic wound on the left breast that is very superficial is likely attributed to hidradenitis as well.  She already had an MRI last year showing no evidence of inflammatory breast cancer or any suspicious for malignancy.  She did have a prior breast biopsy on the left side showing evidence of inflammatory changes without malignancy. I have personally reviewed MRI and recent mammo  Past Medical History:  Diagnosis Date   Abscess    Complication of anesthesia    hr/bp dropped after c/s 2015 in [facility]   had spinal   GERD (gastroesophageal reflux disease)    sometimes throws up when gas gets in her chest   Shortness of breath dyspnea     Past Surgical History:  Procedure Laterality Date   ABCESS DRAINAGE     BUTTOCK   ABDOMINAL SURGERY     tubal, hernia and gallbladder   CESAREAN SECTION     CESAREAN SECTION  2012 and 2015   CHOLECYSTECTOMY     COLPOSCOPY  2015   DILATION AND CURETTAGE OF UTERUS     GALLBLADDER SURGERY  2016   HERNIA REPAIR     HYDRADENITIS EXCISION Bilateral 06/05/2016   Procedure: , right axilla;  Surgeon: Leafy Ro, MD;  Location: [facility] ORS;  Service: General;  Laterality: Bilateral;   HYDRADENITIS EXCISION Left 12/03/2019   Procedure: EXCISION HIDRADENITIS AXILLA Left;  Surgeon: Leafy Ro, MD;  Location: [facility] ORS;  Service: General;  Laterality: Left;   HYDRADENITIS EXCISION Left    INGUINAL HERNIA REPAIR  2016   TUBAL LIGATION     TUBAL LIGATION     TUBAL  LIGATION  2016    Family History  Problem Relation Age of Onset   Hypertension Mother    Breast cancer Maternal Aunt    Uterine cancer Maternal Grandmother    Diabetes Maternal Grandfather    Breast cancer Maternal Grandfather        19    Social History:  reports that she has been smoking cigarettes. She has been smoking an average of .5 packs per day. She has never used smokeless tobacco. She reports current alcohol use of about 6.0 standard drinks per week. She reports that she does not use drugs.  Allergies:  Allergies  Allergen Reactions   Bactrim [Sulfamethoxazole-Trimethoprim] Hives    Medications reviewed.    ROS Full ROS performed and is otherwise negative other than what is stated in HPI   BP 111/65   Pulse 70   Temp 98.6 F (37 C) (Oral)   Ht 5\' 1"  (1.549 m)   Wt 190 lb (86.2 kg)   SpO2 95%   BMI 35.90 kg/m   Physical Exam Vitals and nursing note reviewed. Exam conducted with a chaperone present.  Constitutional:      General: She is not in acute distress.    Appearance: Normal appearance. She is not ill-appearing.  Pulmonary:     Effort: Pulmonary effort is  normal. No respiratory distress.     Breath sounds: Normal breath sounds. No stridor.     Comments: BREAST: LEft superficial ulcer 1x1 cms good granulation, no infection, no palpable masses Abdominal:     General: Abdomen is flat. There is no distension.     Palpations: Abdomen is soft. There is no mass.     Tenderness: There is no abdominal tenderness. There is no guarding or rebound.     Hernia: No hernia is present.  Musculoskeletal:        General: No swelling or tenderness. Normal range of motion.  Skin:    General: Skin is warm and dry.     Capillary Refill: Capillary refill takes less than 2 seconds.  Neurological:     General: No focal deficit present.     Mental Status: She is alert and oriented to person, place, and time.  Psychiatric:        Mood and Affect: Mood normal.         Behavior: Behavior normal.        Thought Content: Thought content normal.        Judgment: Judgment normal.    A/P 35 year old female with chronic hidradenitis no need for surgical intervention.  She does have an appointment with dermatology.  Hidradenitis is very difficult disease with any definitive good treatments.  Main continuation of antibiotics and consultation with dermatology for potential immunological.  Regarding the left breast chronic wound she remains her follow-up mammogram.  I counseled the patient in detail regarding the importance of appropriate follow-up.  Once she get the mammogram we will follow her up for another clinical exam  Greater than 50% of the 30 minutes  visit was spent in counseling/coordination of care   Sterling Big, MD [facility] General Surgeon

## 2021-08-15 ENCOUNTER — Other Ambulatory Visit: Payer: Self-pay | Admitting: Surgery

## 2021-08-15 DIAGNOSIS — L732 Hidradenitis suppurativa: Secondary | ICD-10-CM

## 2021-08-15 DIAGNOSIS — R928 Other abnormal and inconclusive findings on diagnostic imaging of breast: Secondary | ICD-10-CM

## 2021-08-15 DIAGNOSIS — Z1231 Encounter for screening mammogram for malignant neoplasm of breast: Secondary | ICD-10-CM

## 2021-08-15 DIAGNOSIS — N6452 Nipple discharge: Secondary | ICD-10-CM

## 2021-08-16 ENCOUNTER — Telehealth: Payer: Self-pay | Admitting: *Deleted

## 2021-08-16 NOTE — Telephone Encounter (Signed)
Patient called and stated that she went to call [facility] to schedule her mammogram but she was told that we put in the wrong code. She is unsure of what they are talking about. Please call and advise

## 2021-08-31 ENCOUNTER — Other Ambulatory Visit: Payer: Self-pay | Admitting: Nurse Practitioner

## 2021-08-31 NOTE — Telephone Encounter (Signed)
Medication: fluticasone (FLONASE) 50 MCG/ACT nasal spray [[id number]]   Has the patient contacted their pharmacy? YES- Advised to contact the office (Agent: If no, request that the patient contact the pharmacy for the refill. If patient does not wish to contact the pharmacy document the reason why and proceed with request.) (Agent: If yes, when and what did the pharmacy advise?)   Preferred Pharmacy (with phone number or street name): [facility] [address] ([address]),  - [address]) [place] [address] Phone: [phone] Fax: [phone] Hours: Not open 24 hours Has the patient been seen for an appointment in the last year OR does the patient have an upcoming appointment? YES 08/09/21  Agent: Please be advised that RX refills may take up to 3 business days. We ask that you follow-up with your pharmacy.

## 2021-09-01 MED ORDER — FLUTICASONE PROPIONATE 50 MCG/ACT NA SUSP
2.0000 | Freq: Every day | NASAL | 5 refills | Status: DC
Start: 2021-09-01 — End: 2021-12-12

## 2021-09-01 NOTE — Telephone Encounter (Signed)
Requested Prescriptions  Pending Prescriptions Disp Refills  . fluticasone (FLONASE) 50 MCG/ACT nasal spray 16 g 5    Sig: Place 2 sprays into both nostrils daily.     Ear, Nose, and Throat: Nasal Preparations - Corticosteroids Passed - 08/31/2021  4:16 PM      Passed - Valid encounter within last 12 months    Recent Outpatient Visits          1 month ago Hidradenitis suppurativa   [facility] [place], Dorie Rank, NP   1 year ago Hidradenitis suppurativa   [facility] [place], Dorie Rank, NP   1 year ago Hidradenitis suppurativa   [facility] [place], Dorie Rank, NP   1 year ago Annual physical exam   [facility] [place], Corrie Dandy T, NP   2 years ago Flu-like symptoms   [facility] [place], Dorie Rank, NP

## 2021-09-04 NOTE — Telephone Encounter (Signed)
Patient was calling in regards to her order not being right with [facility], can you please give [facility] a call and see what is wrong with the order so we can get the patient scheduled.

## 2021-11-03 ENCOUNTER — Ambulatory Visit: Payer: Self-pay | Admitting: *Deleted

## 2021-11-03 ENCOUNTER — Other Ambulatory Visit: Payer: Self-pay | Admitting: Nurse Practitioner

## 2021-11-03 MED ORDER — CLINDAMYCIN HCL 150 MG PO CAPS: ORAL_CAPSULE | ORAL | 0 refills | Status: DC

## 2021-11-03 NOTE — Telephone Encounter (Signed)
See other encounter.

## 2021-11-03 NOTE — Telephone Encounter (Signed)
I returned pt's call.   She called in earlier today requesting a refill of the Cleocin 150 mg because she has an abscess on her breast which is a chronic problem and it's flaring up.   Marnee Guarneri, NP is aware of this problem. Valli Glance, RN triage nurse also with [facility] has spoken with pt earlier today.   Refill request was sent to [facility].  Pt did not have any additional questions or requests when I returned her call since she spoke with Opal Sidles earlier.   I let pt know the message had been sent to [facility] so someone would be in contact with her.   Reason for Disposition  Boil > 2 inches across (> 5 cm; larger than a golf ball or ping pong ball)    Requesting refill for Cleocin since this is a chronic problem.  Answer Assessment - Initial Assessment Questions 1. APPEARANCE of BOIL: "What does the boil look like?"      It's an abscess on her breast that flares up at times.   It's a chronic problem that Marnee Guarneri, NP is aware of and treats.   Pt requesting a refill of the Cleocin 150 mg which has been submitted earlier today after she talked with another triage nurse, Valli Glance, RN. 2. LOCATION: "Where is the boil located?"      breast 3. NUMBER: "How many boils are there?"      One 4. SIZE: "How big is the boil?" (e.g., inches, cm; compare to size of a coin or other object)     *No Answer* 5. ONSET: "When did the boil start?"     *No Answer* 6. PAIN: "Is there any pain?" If Yes, ask: "How bad is the pain?"   (Scale 1-10; or mild, moderate, severe)     *No Answer* 7. FEVER: "Do you have a fever?" If Yes, ask: "What is it, how was it measured, and when did it start?"      *No Answer* 8. SOURCE: "Have you been around anyone with boils or other Staph infections?" "Have you ever had boils before?"     *No Answer* 9. OTHER SYMPTOMS: "Do you have any other symptoms?" (e.g., shaking chills, weakness, rash elsewhere on body)     *No Answer* 10.  PREGNANCY: "Is there any chance you are pregnant?" "When was your last menstrual period?"       *No Answer*  Protocols used: Boil (Skin Abscess)-A-AH

## 2021-11-03 NOTE — Telephone Encounter (Signed)
Medication Refill - Medication: clindamycin (CLEOCIN) 150 MG capsule    Has the patient contacted their pharmacy? No. (Agent: If no, request that the patient contact the pharmacy for the refill. If patient does not wish to contact the pharmacy document the reason why and proceed with request.) (Agent: If yes, when and what did the pharmacy advise?) Rx is expired   Preferred Pharmacy (with phone number or street name): [facility] ([facility] [place] [address]  [place], [place] ([place]) [place] [address]  Phone:  [phone]  Fax:  [phone]  Has the patient been seen for an appointment in the last year OR does the patient have an upcoming appointment? Yes.    Agent: Please be advised that RX refills may take up to 3 business days. We ask that you follow-up with your pharmacy.

## 2021-11-03 NOTE — Telephone Encounter (Signed)
°  Chief Complaint: Flare up of abscess on her breast which is a recurring problem treated with Cleocin.   She spoke with triage nurse this morning because a refill request was submitted for the Cleocin she takes for flare ups. Symptoms: See above Frequency:  Pertinent Negatives: Patient denies  Disposition: [] ED /[] Urgent Care (no appt availability in office) / [] Appointment(In office/virtual)/ []  [place] [facility]/ [] Home Care/ [] Refused Recommended Disposition /[] [place] Mobile Bus/ [x]  Follow-up with PCP Additional Notes: Request for Cleocin 150 mg sent to , NP for review for refill earlier today by another triage nurse who spoke with pt.   See notes.

## 2021-11-03 NOTE — Telephone Encounter (Signed)
Routing to provider to advise.  

## 2021-11-03 NOTE — Telephone Encounter (Signed)
Call to patient regarding her request for antibiotic RF: Patient states she has reoccurring abscess on her breast that the provider is familiar with and treats. Patient states she has had a flare and the area is leaking again. She is requesting antibiotic refill.

## 2021-11-06 ENCOUNTER — Other Ambulatory Visit: Payer: Self-pay | Admitting: Nurse Practitioner

## 2021-11-07 NOTE — Telephone Encounter (Signed)
Requested medications are due for refill today.  Provider to review.  Requested medications are on the active medications list.  no  Last refill. 08/03/2020  Future visit scheduled.   no  Notes to clinic.  Medication not on med list. Please review.    Requested Prescriptions  Pending Prescriptions Disp Refills   PROAIR HFA 108 (90 Base) MCG/ACT inhaler [Pharmacy Med Name: ProAir HFA 108 (90 Base) MCG/ACT Inhalation Aerosol Solution] 9 g 0    Sig: INHALE 2 PUFFS BY MOUTH EVERY 6 HOURS AS NEEDED FOR WHEEZING OR SHORTNESS OF BREATH     Pulmonology:  Beta Agonists Failed - 11/06/2021  6:09 PM      Failed - One inhaler should last at least one month. If the patient is requesting refills earlier, contact the patient to check for uncontrolled symptoms.      Passed - Valid encounter within last 12 months    Recent Outpatient Visits           3 months ago Hidradenitis suppurativa   [facility] [place], Dorie Rank, NP   1 year ago Hidradenitis suppurativa   [facility] [place], Dorie Rank, NP   1 year ago Hidradenitis suppurativa   [facility] [place], Dorie Rank, NP   2 years ago Annual physical exam   [facility] [place], Corrie Dandy T, NP   3 years ago Flu-like symptoms   [facility] [place], Dorie Rank, NP

## 2021-11-08 ENCOUNTER — Telehealth: Payer: Self-pay | Admitting: Nurse Practitioner

## 2021-11-08 NOTE — Telephone Encounter (Signed)
Routing to provider. OK to give verbal to change?

## 2021-11-08 NOTE — Telephone Encounter (Signed)
Copied from CRM [id number]. Topic: General - Other >> Nov 07, 2021  5:01 PM Gaetana Michaelis A wrote: Reason for CRM: Lurena Joiner with [facility] [employer] has called to request a change in patient's prescription from [facility] HFA 108 (90 Base) MCG/ACT inhaler [[id number]]  to Ventolin so that it can be covered by the patient's insurance   Please contact further when possible

## 2021-11-08 NOTE — Telephone Encounter (Signed)
Noted  

## 2021-11-08 NOTE — Telephone Encounter (Signed)
Verbal was provided for patient per Jolene. Lurena Joiner from [facility] verbalized understanding and states the company do not make Pro-Air inhaler any longer. Just FYI. Lurena Joiner switched to Ventolin brand only as they are covered by patient's insurance.

## 2021-11-21 ENCOUNTER — Ambulatory Visit: Payer: Self-pay

## 2021-11-21 NOTE — Telephone Encounter (Signed)
°  Chief Complaint: vaginal bleeding Symptoms: vaginal bleeding after period and abdominal pain that was severe but has gotten better now Frequency: 4 days Pertinent Negatives: NA Disposition: [] ED /[] Urgent Care (no appt availability in office) / [x] Appointment(In office/virtual)/ []  [place] [facility]/ [] Home Care/ [] Refused Recommended Disposition /[]  Mobile Bus/ []  Follow-up with PCP Additional Notes: pt has hx of having D&C done as well has procedures after having child. Pt states she wasn't taking her clindamycin like she was suppose to and started back on Sunday taking it correctly and symptoms have gotten better but bleeding is continued. Pt is also wanting to have pap done so scheduled an appt for 11/27/21 with Dr. since [place] was booked until 12/11/21.    Reason for Disposition  [1] Bleeding or spotting between regular periods AND [2] occurs more than three cycles (3 months) this past year  Answer Assessment - Initial Assessment Questions 1. AMOUNT: "Describe the bleeding that you are having."    - SPOTTING: spotting, or pinkish / brownish mucous discharge; does not fill panty liner or pad    - MILD:  less than 1 pad / hour; less than patient's usual menstrual bleeding   - MODERATE: 1-2 pads / hour; 1 menstrual cup every 6 hours; small-medium blood clots (e.g., pea, grape, small coin)   - SEVERE: soaking 2 or more pads/hour for 2 or more hours; 1 menstrual cup every 2 hours; bleeding not contained by pads or continuous red blood from vagina; large blood clots (e.g., golf ball, large coin)      Spotting to slightly bleeding 2. ONSET: "When did the bleeding begin?" "Is it continuing now?"     4 days ago 3. MENSTRUAL PERIOD: "When was the last normal menstrual period?" "How is this different than your period?"     1/2/-1/31 4. REGULARITY: "How regular are your periods?"     yes 5. ABDOMINAL PAIN: "Do you have any pain?" "How bad is the pain?"  (e.g., Scale 1-10;  mild, moderate, or severe)   - MILD (1-3): doesn't interfere with normal activities, abdomen soft and not tender to touch    - MODERATE (4-7): interferes with normal activities or awakens from sleep, abdomen tender to touch    - SEVERE (8-10): excruciating pain, doubled over, unable to do any normal activities      Saturday pain was severe and lasted for 24 hrs and now pain has came down and is at like 7/8 6. PREGNANCY: "Could you be pregnant?" "Are you sexually active?" "Did you recently give birth?"     No 8. HORMONES: "Are you taking any hormone medications, prescription or OTC?" (e.g., birth control pills, estrogen)     no 9. BLOOD THINNERS: "Do you take any blood thinners?" (e.g., Coumadin/warfarin, Pradaxa/dabigatran, aspirin)     no 10. CAUSE: "What do you think is causing the bleeding?" (e.g., recent gyn surgery, recent gyn procedure; known bleeding disorder, cervical cancer, polycystic ovarian disease, fibroids)         Hydrenitis  11. HEMODYNAMIC STATUS: "Are you weak or feeling lightheaded?" If Yes, ask: "Can you stand and walk normally?"        Always feel weak  12. OTHER SYMPTOMS: "What other symptoms are you having with the bleeding?" (e.g., passed tissue, vaginal discharge, fever, menstrual-type cramps)       No  Protocols used: Vaginal Bleeding - Abnormal-A-AH

## 2021-11-27 ENCOUNTER — Encounter: Payer: Self-pay | Admitting: Family Medicine

## 2021-11-27 ENCOUNTER — Other Ambulatory Visit: Payer: Self-pay

## 2021-11-27 ENCOUNTER — Ambulatory Visit: Payer: Medicaid Other | Admitting: Family Medicine

## 2021-11-27 VITALS — BP 115/73 | HR 80 | Temp 98.5°F | Wt 203.6 lb

## 2021-11-27 DIAGNOSIS — N939 Abnormal uterine and vaginal bleeding, unspecified: Secondary | ICD-10-CM | POA: Diagnosis not present

## 2021-11-27 DIAGNOSIS — A599 Trichomoniasis, unspecified: Secondary | ICD-10-CM | POA: Diagnosis not present

## 2021-11-27 LAB — WET PREP FOR TRICH, YEAST, CLUE
Clue Cell Exam: NEGATIVE
Trichomonas Exam: POSITIVE — AB
Yeast Exam: NEGATIVE

## 2021-11-27 LAB — URINALYSIS, ROUTINE W REFLEX MICROSCOPIC
Bilirubin, UA: NEGATIVE
Glucose, UA: NEGATIVE
Ketones, UA: NEGATIVE
Leukocytes,UA: NEGATIVE
Nitrite, UA: NEGATIVE
Protein,UA: NEGATIVE
Specific Gravity, UA: >1.03 — ABNORMAL HIGH (ref 1.005–1.030)
Urobilinogen, Ur: 1 mg/dL (ref 0.2–1.0)
pH, UA: 6 (ref 5.0–7.5)

## 2021-11-27 LAB — MICROSCOPIC EXAMINATION
Bacteria, UA: NONE SEEN
Epithelial Cells (non renal): NONE SEEN /hpf (ref 0–10)
WBC, UA: NONE SEEN /hpf (ref 0–5)

## 2021-11-27 LAB — PREGNANCY, URINE: Preg Test, Ur: NEGATIVE

## 2021-11-27 MED ORDER — METRONIDAZOLE 500 MG PO TABS: 1000.0000 mg | ORAL_TABLET | Freq: Once | ORAL | 0 refills | Status: AC

## 2021-11-27 NOTE — Assessment & Plan Note (Signed)
This has happened to patient before and she required a D&C. She states that she has had at least 1, the first being in 2008. She is unsure when the last one was. Discussed that this could be an abnormally long period, that she could have irritation due to infection, that she could have a vaginal laceration or that there could be an intrauterine issue. No laceration seen on exam today and bleeding appears to be coming from the uterus. +Trich on wet prep- will treat with flagyl. Given reoccurrence, recommend test of cure when she returns. Will check labs to look for thyroid issue or anemia. Will get her scheduled for pelvic US. Await results. Treat as needed. Follow up with PCP in 2 weeks.

## 2021-11-27 NOTE — Progress Notes (Signed)
BP 115/73    Pulse 80    Temp 98.5 F (36.9 C)    Wt 203 lb 9.6 oz (92.4 kg)    SpO2 97%    BMI 38.47 kg/m    Subjective:    Patient ID: Samantha Orr, female    DOB: 03-21-1986, 36 y.o.   MRN: [id number]  HPI: Samantha Orr is a 36 y.o. female  Chief Complaint  Patient presents with   Vaginal Bleeding    Patient states her last menstrual cycle was 11/08/21. Patient has been bleeding between cycle.    ABNORMAL MENSTRUAL PERIODS Duration: 2.5-3 weeks Average interval between menses: 28-31 days Length of menses: 6 weeks Flow: 3 pads a day Dysmenorrhea: yes Intermenstrual bleeding:yes Postcoital bleeding: no Contraception: tubal ligation Menarche at age: 77  History of sexually transmitted diseases: no History GYN procedures: no Abnormal pap smears: yes   Dyspareunia: N/A Vaginal discharge:no Abdominal pain: yes Galactorrhea: no Hirsuitism: no Frequent bruising/mucosal bleeding: yes Double vision:no Hot flashes: no  Relevant past medical, surgical, family and social history reviewed and updated as indicated. Interim medical history since our last visit reviewed. Allergies and medications reviewed and updated.  Review of Systems  Per HPI unless specifically indicated above     Objective:    BP 115/73    Pulse 80    Temp 98.5 F (36.9 C)    Wt 203 lb 9.6 oz (92.4 kg)    SpO2 97%    BMI 38.47 kg/m   Wt Readings from Last 3 Encounters:  11/27/21 203 lb 9.6 oz (92.4 kg)  08/09/21 190 lb (86.2 kg)  07/20/21 190 lb 9.6 oz (86.5 kg)    Physical Exam Vitals and nursing note reviewed. Exam conducted with a chaperone present.  Constitutional:      General: She is not in acute distress.    Appearance: Normal appearance. She is not ill-appearing, toxic-appearing or diaphoretic.  HENT:     Head: Normocephalic and atraumatic.     Right Ear: External ear normal.     Left Ear: External ear normal.     Nose: Nose normal.     Mouth/Throat:     Mouth: Mucous  membranes are moist.     Pharynx: Oropharynx is clear.  Eyes:     General: No scleral icterus.       Right eye: No discharge.        Left eye: No discharge.     Extraocular Movements: Extraocular movements intact.     Conjunctiva/sclera: Conjunctivae normal.     Pupils: Pupils are equal, round, and reactive to light.  Cardiovascular:     Rate and Rhythm: Normal rate and regular rhythm.     Pulses: Normal pulses.     Heart sounds: Normal heart sounds. No murmur heard.   No friction rub. No gallop.  Pulmonary:     Effort: Pulmonary effort is normal. No respiratory distress.     Breath sounds: Normal breath sounds. No stridor. No wheezing, rhonchi or rales.  Chest:     Chest wall: No tenderness.  Genitourinary:    Labia:        Right: No rash, tenderness, lesion or injury.        Left: No rash, tenderness, lesion or injury.      Urethra: No prolapse, urethral pain, urethral swelling or urethral lesion.     Vagina: Normal.     Cervix: Cervical bleeding present. No cervical motion tenderness, discharge, friability, lesion, erythema  or eversion.     Uterus: Normal.   Musculoskeletal:        General: Normal range of motion.     Cervical back: Normal range of motion and neck supple.  Skin:    General: Skin is warm and dry.     Capillary Refill: Capillary refill takes less than 2 seconds.     Coloration: Skin is not jaundiced or pale.     Findings: No bruising, erythema, lesion or rash.  Neurological:     General: No focal deficit present.     Mental Status: She is alert and oriented to person, place, and time. Mental status is at baseline.  Psychiatric:        Mood and Affect: Mood normal.        Behavior: Behavior normal.        Thought Content: Thought content normal.        Judgment: Judgment normal.    Results for orders placed or performed in visit on 11/27/21  Microscopic Examination  Result Value Ref Range   WBC, UA None seen 0 - 5 /hpf   RBC 11-30R 0 - 2 /hpf    Epithelial Cells (non renal) None seen 0 - 10 /hpf   Mucus, UA Present (A) Not Estab.   Bacteria, UA None seen None seen/Few   Trichomonas, UA Present (A) None seen  WET PREP FOR TRICH, YEAST, CLUE   Urine  Result Value Ref Range   Trichomonas Exam Positive (A) Negative   Yeast Exam Negative Negative   Clue Cell Exam Negative Negative  Urinalysis, Routine w reflex microscopic  Result Value Ref Range   Specific Gravity, UA >1.030 (H) 1.005 - 1.030   pH, UA 6.0 5.0 - 7.5   Color, UA Yellow Yellow   Appearance Ur Cloudy (A) Clear   Leukocytes,UA Negative Negative   Protein,UA Negative Negative/Trace   Glucose, UA Negative Negative   Ketones, UA Negative Negative   RBC, UA 3+ (A) Negative   Bilirubin, UA Negative Negative   Urobilinogen, Ur 1.0 0.2 - 1.0 mg/dL   Nitrite, UA Negative Negative   Microscopic Examination See below:   Pregnancy, urine  Result Value Ref Range   Preg Test, Ur Negative Negative  HM PAP SMEAR  Result Value Ref Range   HM Pap smear neg with negative HPV       Assessment & Plan:   Problem List Items Addressed This Visit       Other   Vaginal bleeding - Primary    This has happened to patient before and she required a D&C. She states that she has had at least 1, the first being in 2008. She is unsure when the last one was. Discussed that this could be an abnormally long period, that she could have irritation due to infection, that she could have a vaginal laceration or that there could be an intrauterine issue. No laceration seen on exam today and bleeding appears to be coming from the uterus. +Trich on wet prep- will treat with flagyl. Given reoccurrence, recommend test of cure when she returns. Will check labs to look for thyroid issue or anemia. Will get her scheduled for pelvic US. Await results. Treat as needed. Follow up with PCP in 2 weeks.       Relevant Orders   WET PREP FOR TRICH, YEAST, CLUE   Urinalysis, Routine w reflex microscopic  (Completed)   Pregnancy, urine (Completed)   CBC with Differential/Platelet   TSH  US Pelvic Complete With Transvaginal   Other Visit Diagnoses     Trichimoniasis       Will treat with flagyl. This is recurrent. No partners. Recommend test of cure next visit.    Relevant Medications   metroNIDAZOLE (FLAGYL) 500 MG tablet        Follow up plan: Return 2 weeks with PCP.   > 1 hour spent with patient today

## 2021-11-28 LAB — CBC WITH DIFFERENTIAL/PLATELET
Basophils Absolute: 0 10*3/uL (ref 0.0–0.2)
Basos: 1 %
EOS (ABSOLUTE): 0.5 10*3/uL — ABNORMAL HIGH (ref 0.0–0.4)
Eos: 6 %
Hematocrit: 38.2 % (ref 34.0–46.6)
Hemoglobin: 12.5 g/dL (ref 11.1–15.9)
Immature Grans (Abs): 0.1 10*3/uL (ref 0.0–0.1)
Immature Granulocytes: 1 %
Lymphocytes Absolute: 1.8 10*3/uL (ref 0.7–3.1)
Lymphs: 23 %
MCH: 27.9 pg (ref 26.6–33.0)
MCHC: 32.7 g/dL (ref 31.5–35.7)
MCV: 85 fL (ref 79–97)
Monocytes Absolute: 0.6 10*3/uL (ref 0.1–0.9)
Monocytes: 8 %
Neutrophils Absolute: 4.8 10*3/uL (ref 1.4–7.0)
Neutrophils: 61 %
Platelets: 188 10*3/uL (ref 150–450)
RBC: 4.48 x10E6/uL (ref 3.77–5.28)
RDW: 12.6 % (ref 11.7–15.4)
WBC: 7.7 10*3/uL (ref 3.4–10.8)

## 2021-11-28 LAB — TSH: TSH: 0.96 u[IU]/mL (ref 0.450–4.500)

## 2021-12-05 ENCOUNTER — Ambulatory Visit
Admission: RE | Admit: 2021-12-05 | Discharge: 2021-12-05 | Disposition: A | Discharge status: Home / Self Care | Payer: Medicaid Other | Source: Ambulatory Visit | Attending: Family Medicine | Admitting: Family Medicine

## 2021-12-05 ENCOUNTER — Other Ambulatory Visit: Payer: Self-pay

## 2021-12-05 DIAGNOSIS — N939 Abnormal uterine and vaginal bleeding, unspecified: Secondary | ICD-10-CM | POA: Diagnosis not present

## 2021-12-05 DIAGNOSIS — N83202 Unspecified ovarian cyst, left side: Secondary | ICD-10-CM | POA: Diagnosis not present

## 2021-12-10 DIAGNOSIS — N83209 Unspecified ovarian cyst, unspecified side: Secondary | ICD-10-CM | POA: Insufficient documentation

## 2021-12-12 ENCOUNTER — Encounter: Payer: Self-pay | Admitting: Nurse Practitioner

## 2021-12-12 ENCOUNTER — Other Ambulatory Visit: Payer: Self-pay

## 2021-12-12 ENCOUNTER — Other Ambulatory Visit: Payer: Self-pay | Admitting: Nurse Practitioner

## 2021-12-12 ENCOUNTER — Ambulatory Visit: Payer: Medicaid Other | Admitting: Nurse Practitioner

## 2021-12-12 VITALS — BP 118/79 | HR 76 | Temp 98.2°F | Ht 61.0 in | Wt 203.6 lb

## 2021-12-12 DIAGNOSIS — E6609 Other obesity due to excess calories: Secondary | ICD-10-CM

## 2021-12-12 DIAGNOSIS — A599 Trichomoniasis, unspecified: Secondary | ICD-10-CM

## 2021-12-12 DIAGNOSIS — N83202 Unspecified ovarian cyst, left side: Secondary | ICD-10-CM | POA: Diagnosis not present

## 2021-12-12 DIAGNOSIS — N939 Abnormal uterine and vaginal bleeding, unspecified: Secondary | ICD-10-CM

## 2021-12-12 DIAGNOSIS — F17210 Nicotine dependence, cigarettes, uncomplicated: Secondary | ICD-10-CM

## 2021-12-12 DIAGNOSIS — J449 Chronic obstructive pulmonary disease, unspecified: Secondary | ICD-10-CM

## 2021-12-12 DIAGNOSIS — Z6838 Body mass index (BMI) 38.0-38.9, adult: Secondary | ICD-10-CM

## 2021-12-12 LAB — WET PREP FOR TRICH, YEAST, CLUE
Clue Cell Exam: POSITIVE — AB
Trichomonas Exam: POSITIVE — AB
Yeast Exam: NEGATIVE

## 2021-12-12 MED ORDER — FLUTICASONE PROPIONATE 50 MCG/ACT NA SUSP: 2.0000 | Freq: Every day | NASAL | 5 refills | Status: DC

## 2021-12-12 MED ORDER — UMECLIDINIUM-VILANTEROL 62.5-25 MCG/ACT IN AEPB: 1.0000 | INHALATION_SPRAY | Freq: Every day | RESPIRATORY_TRACT | 4 refills | Status: DC

## 2021-12-12 MED ORDER — MONTELUKAST SODIUM 10 MG PO TABS: 10.0000 mg | ORAL_TABLET | Freq: Every day | ORAL | 4 refills | Status: DC

## 2021-12-12 MED ORDER — METRONIDAZOLE 500 MG PO TABS: 500.0000 mg | ORAL_TABLET | Freq: Two times a day (BID) | ORAL | 0 refills | Status: AC

## 2021-12-12 NOTE — Assessment & Plan Note (Signed)
Referral to GYN to further discuss per patient request -- has history of abnormal paps and is a smoker.  Repeat imaging in 3-6 months.

## 2021-12-12 NOTE — Addendum Note (Signed)
Addended by: Aura Dials T on: 12/12/2021 03:52 PM   Modules accepted: Orders

## 2021-12-12 NOTE — Assessment & Plan Note (Signed)
Noted on past imaging, she is a daily smoker.  Recommend complete cessation of smoking.  Using Albuterol frequently, will start Anoro daily -- educated her on this medication + send in Singulair for allergic rhinitis which is trigger to her COPD.  Return in 6 weeks for visit and spirometry.

## 2021-12-12 NOTE — Assessment & Plan Note (Signed)
BMI 38.47.  Recommended eating smaller high protein, low fat meals more frequently and exercising 30 mins a day 5 times a week with a goal of 10-15lb weight loss in the next 3 months. Patient voiced their understanding and motivation to adhere to these recommendations.

## 2021-12-12 NOTE — Progress Notes (Signed)
BP 118/79    Pulse 76    Temp 98.2 F (36.8 C) (Oral)    Ht 5\' 1"  (1.549 m)    Wt 203 lb 9.6 oz (92.4 kg)    SpO2 98%    BMI 38.47 kg/m    Subjective:    Patient ID: Samantha Orr, female    DOB: 10-11-1986, 36 y.o.   MRN: [id number]  HPI: Samantha Orr is a 36 y.o. female  Chief Complaint  Patient presents with   Vaginal Bleeding    Patient states she is doing good. Patient denies having any concerns at today's visit.    SEXUALLY TRANSMITTED DISEASE   Medication Refill    Patient is requesting a refill on Flonase prescription.    VAGINAL BLEEDING Was seen in office 11/27/2021 for vaginal bleeding that was prolonged.  Her cycles had been more irregular.  Labs performed, noted trich which she was treated.  CBC was reassuring and thyroid normal.  She reports bleeding has improved at this time, no further bleeding.  Has history of abnormal paps.  Had a pelvic ultrasound on 12/05/21 -- this noted premenopausal endometrial thickness + a 5.4 cm left ovarian cyst, which they recommend repeat imaging in 3-6 months.  Does have a history of ablations she reports last in 2013. Duration: weeks Pruritus: no Dysuria: no Malodorous: no Urinary frequency: no Fevers: no Abdominal pain: no  Sexual activity: practicing safe sex History of sexually transmitted diseases: yes Recent antibiotic use: yes Context:better  Treatments attempted: antibiotic  COPD Continues to smoke, 1/2 PPD -- less then previous. Has Albuterol inhaler to use as needed. COPD status: stable Satisfied with current treatment?: yes Oxygen use: no Dyspnea frequency: none Cough frequency: occasional Rescue inhaler frequency:  7 days a week Limitation of activity: no Productive cough: none Last Spirometry: none Pneumovax: Not up to Date Influenza: Not up to Date   Relevant past medical, surgical, family and social history reviewed and updated as indicated. Interim medical history since our last visit  reviewed. Allergies and medications reviewed and updated.  Review of Systems  Constitutional:  Negative for activity change, appetite change, diaphoresis, fatigue and fever.  Respiratory:  Negative for cough, chest tightness and shortness of breath.   Cardiovascular:  Negative for chest pain, palpitations and leg swelling.  Genitourinary:  Negative for vaginal bleeding and vaginal discharge.  Psychiatric/Behavioral: Negative.     Per HPI unless specifically indicated above     Objective:    BP 118/79    Pulse 76    Temp 98.2 F (36.8 C) (Oral)    Ht 5\' 1"  (1.549 m)    Wt 203 lb 9.6 oz (92.4 kg)    SpO2 98%    BMI 38.47 kg/m   Wt Readings from Last 3 Encounters:  12/12/21 203 lb 9.6 oz (92.4 kg)  11/27/21 203 lb 9.6 oz (92.4 kg)  08/09/21 190 lb (86.2 kg)    Physical Exam Vitals and nursing note reviewed.  Constitutional:      General: She is awake. She is not in acute distress.    Appearance: She is well-developed and well-groomed. She is obese. She is not ill-appearing.  HENT:     Head: Normocephalic.     Right Ear: Hearing normal.     Left Ear: Hearing normal.     Nose: Nose normal.     Mouth/Throat:     Mouth: Mucous membranes are moist.  Eyes:     General: Lids are  normal.        Right eye: No discharge.        Left eye: No discharge.     Conjunctiva/sclera: Conjunctivae normal.     Pupils: Pupils are equal, round, and reactive to light.  Cardiovascular:     Rate and Rhythm: Normal rate and regular rhythm.     Heart sounds: Normal heart sounds. No murmur heard.   No gallop.  Pulmonary:     Effort: Pulmonary effort is normal. No accessory muscle usage or respiratory distress.     Breath sounds: Normal breath sounds.  Abdominal:     General: Bowel sounds are normal.     Palpations: Abdomen is soft.  Musculoskeletal:     Cervical back: Normal range of motion and neck supple.     Right lower leg: No edema.     Left lower leg: No edema.  Skin:    General: Skin  is warm and dry.  Neurological:     Mental Status: She is alert and oriented to person, place, and time.  Psychiatric:        Attention and Perception: Attention normal.        Mood and Affect: Mood normal.        Speech: Speech normal.        Behavior: Behavior normal. Behavior is cooperative.        Thought Content: Thought content normal.    Results for orders placed or performed in visit on 11/27/21  Microscopic Examination  Result Value Ref Range   WBC, UA None seen 0 - 5 /hpf   RBC 11-30R 0 - 2 /hpf   Epithelial Cells (non renal) None seen 0 - 10 /hpf   Mucus, UA Present (A) Not Estab.   Bacteria, UA None seen None seen/Few   Trichomonas, UA Present (A) None seen  WET PREP FOR TRICH, YEAST, CLUE   Urine  Result Value Ref Range   Trichomonas Exam Positive (A) Negative   Yeast Exam Negative Negative   Clue Cell Exam Negative Negative  Urinalysis, Routine w reflex microscopic  Result Value Ref Range   Specific Gravity, UA >1.030 (H) 1.005 - 1.030   pH, UA 6.0 5.0 - 7.5   Color, UA Yellow Yellow   Appearance Ur Cloudy (A) Clear   Leukocytes,UA Negative Negative   Protein,UA Negative Negative/Trace   Glucose, UA Negative Negative   Ketones, UA Negative Negative   RBC, UA 3+ (A) Negative   Bilirubin, UA Negative Negative   Urobilinogen, Ur 1.0 0.2 - 1.0 mg/dL   Nitrite, UA Negative Negative   Microscopic Examination See below:   Pregnancy, urine  Result Value Ref Range   Preg Test, Ur Negative Negative  CBC with Differential/Platelet  Result Value Ref Range   WBC 7.7 3.4 - 10.8 x10E3/uL   RBC 4.48 3.77 - 5.28 x10E6/uL   Hemoglobin 12.5 11.1 - 15.9 g/dL   Hematocrit 38.2 34.0 - 46.6 %   MCV 85 79 - 97 fL   MCH 27.9 26.6 - 33.0 pg   MCHC 32.7 31.5 - 35.7 g/dL   RDW 12.6 11.7 - 15.4 %   Platelets 188 150 - 450 x10E3/uL   Neutrophils 61 Not Estab. %   Lymphs 23 Not Estab. %   Monocytes 8 Not Estab. %   Eos 6 Not Estab. %   Basos 1 Not Estab. %   Neutrophils  Absolute 4.8 1.4 - 7.0 x10E3/uL   Lymphocytes Absolute 1.8 0.7 -  3.1 x10E3/uL   Monocytes Absolute 0.6 0.1 - 0.9 x10E3/uL   EOS (ABSOLUTE) 0.5 (H) 0.0 - 0.4 x10E3/uL   Basophils Absolute 0.0 0.0 - 0.2 x10E3/uL   Immature Granulocytes 1 Not Estab. %   Immature Grans (Abs) 0.1 0.0 - 0.1 x10E3/uL  TSH  Result Value Ref Range   TSH 0.960 0.450 - 4.500 uIU/mL  HM PAP SMEAR  Result Value Ref Range   HM Pap smear neg with negative HPV       Assessment & Plan:   Problem List Items Addressed This Visit       Respiratory   COPD (chronic obstructive pulmonary disease) ([place]) - Primary    Noted on past imaging, she is a daily smoker.  Recommend complete cessation of smoking.  Using Albuterol frequently, will start Anoro daily -- educated her on this medication + send in Singulair for allergic rhinitis which is trigger to her COPD.  Return in 6 weeks for visit and spirometry.      Relevant Medications   fluticasone (FLONASE) 50 MCG/ACT nasal spray   montelukast (SINGULAIR) 10 MG tablet   umeclidinium-vilanterol (ANORO ELLIPTA) 62.5-25 MCG/ACT AEPB     Endocrine   Ovarian cyst    Referral to GYN to further discuss per patient request -- has history of abnormal paps and is a smoker.  Repeat imaging in 3-6 months.      Relevant Orders   Ambulatory referral to Gynecology     Other   Nicotine dependence, cigarettes, uncomplicated    I have recommended complete cessation of tobacco use. I have discussed various options available for assistance with tobacco cessation including over the counter methods (Nicotine gum, patch and lozenges). We also discussed prescription options (Chantix, Nicotine Inhaler / Nasal Spray). The patient is not interested in pursuing any prescription tobacco cessation options at this time.      Obesity    BMI 38.47.  Recommended eating smaller high protein, low fat meals more frequently and exercising 30 mins a day 5 times a week with a goal of 10-15lb weight loss in  the next 3 months. Patient voiced their understanding and motivation to adhere to these recommendations.       Vaginal bleeding    Improved at this time, referral to GYN per request.      Relevant Orders   Ambulatory referral to Gynecology   Other Visit Diagnoses     Trichomonosis       Trich restest today.   Relevant Orders   WET PREP FOR [place], YEAST, CLUE        Follow up plan: Return in about 6 weeks (around 01/23/2022) for COPD with spirometry.

## 2021-12-12 NOTE — Patient Instructions (Signed)
Ovarian Cyst °An ovarian cyst is a fluid-filled sac on an ovary. Most of these cysts go away on their own and are not cancer. Some cysts need treatment. °What are the causes? °Ovarian hyperstimulation syndrome. Some medicines may lead to this problem. °Polycystic ovarian syndrome (PCOS). Problems with body chemicals (hormones) can lead to this condition. °The normal menstrual cycle. °What increases the risk? °Being overweight or very overweight. °Taking medicines to increase your chance of getting pregnant. °Using some types of birth control. °Smoking. °What are the signs or symptoms? °Many ovarian cysts do not cause symptoms. If you get symptoms, you may have: °Pain or pressure in the area between the hip bones. °Pain in the lower belly. °Pain during sex. °Swelling in the lower belly. °Periods that are not regular. °Pain with periods. °How is this treated? °Many ovarian cysts go away on their own without treatment. If you need treatment, it may include: °Medicines for pain. °Fluid taken out of the cyst. °The cyst being taken out. °Birth control pills or other medicines. °Surgery to remove the ovary. °Follow these instructions at home: °Take over-the-counter and prescription medicines only as told by your doctor. °Ask your doctor if you should avoid driving or using machines while you are taking your medicine. °Get exams and Pap tests as told by your doctor. °Return to your normal activities when your doctor says that it is safe. °Do not smoke or use any products that contain nicotine or tobacco. If you need help quitting, ask your doctor. °Keep all follow-up visits. °Contact a doctor if: °Your periods: °Are late. °Are not regular. °Stop. °Are painful. °You have pain in the area between your hip bones, and the pain does not go away. °You feel pressure on your bladder. °You have trouble peeing. °You feel full, or your belly hurts, swells, or bloats. °You gain or lose weight without trying, or you are less hungry than  normal. °You feel pain and pressure in your back. °You feel pain and pressure in the area between your hip bones. °You think you may be pregnant. °Get help right away if: °You have pain in your belly that is very bad or gets worse. °You have pain in the area between your hip bones, and the pain is very bad or gets worse. °You cannot eat or drink without vomiting. °You get a fever or chills all of a sudden. °Your period is a lot heavier than usual. °Summary °An ovarian cyst is a fluid-filled sac on an ovary. °Some cysts may cause problems and need treatment. °Most of these cysts go away on their own. °This information is not intended to replace advice given to you by your health care provider. Make sure you discuss any questions you have with your health care provider. °Document Revised: 03/10/2020 Document Reviewed: 03/10/2020 °Elsevier Patient Education © 2022 [employer] ° °

## 2021-12-12 NOTE — Progress Notes (Signed)
Please let Daliana know her labs returned positive for trichomonas still and also positive for bacterial vaginosis.  I am sending in another round of Flagyl 500 MG BID, she needs to take this for a total of 7 days and then I would like her to return for retesting in 2 weeks which she can do via outpatient lab visit.  Any questions? Keep being stellar!!  Thank you for allowing me to participate in your care.  I appreciate you. Kindest regards, Rayme Bui

## 2021-12-12 NOTE — Assessment & Plan Note (Signed)
I have recommended complete cessation of tobacco use. I have discussed various options available for assistance with tobacco cessation including over the counter methods (Nicotine gum, patch and lozenges). We also discussed prescription options (Chantix, Nicotine Inhaler / Nasal Spray). The patient is not interested in pursuing any prescription tobacco cessation options at this time.  

## 2021-12-12 NOTE — Assessment & Plan Note (Signed)
Improved at this time, referral to GYN per request.

## 2021-12-26 ENCOUNTER — Other Ambulatory Visit: Payer: Self-pay

## 2021-12-26 ENCOUNTER — Other Ambulatory Visit: Payer: Medicaid Other

## 2021-12-26 DIAGNOSIS — A599 Trichomoniasis, unspecified: Secondary | ICD-10-CM

## 2021-12-26 LAB — WET PREP FOR TRICH, YEAST, CLUE
Clue Cell Exam: NEGATIVE
Trichomonas Exam: NEGATIVE
Yeast Exam: NEGATIVE

## 2021-12-26 NOTE — Progress Notes (Signed)
Good morning, please let Kristopher know her wet prep is all clear.  No further infection.

## 2022-01-08 ENCOUNTER — Other Ambulatory Visit (HOSPITAL_COMMUNITY)
Admission: RE | Admit: 2022-01-08 | Discharge: 2022-01-08 | Disposition: A | Discharge status: Home / Self Care | Payer: Medicaid Other | Source: Ambulatory Visit | Attending: Family Medicine | Admitting: Family Medicine

## 2022-01-08 ENCOUNTER — Encounter: Payer: Self-pay | Admitting: Family Medicine

## 2022-01-08 ENCOUNTER — Other Ambulatory Visit: Payer: Self-pay

## 2022-01-08 ENCOUNTER — Ambulatory Visit: Payer: Medicaid Other | Admitting: Family Medicine

## 2022-01-08 VITALS — BP 120/70 | HR 84 | Ht 61.5 in | Wt 202.0 lb

## 2022-01-08 DIAGNOSIS — N83201 Unspecified ovarian cyst, right side: Secondary | ICD-10-CM

## 2022-01-08 DIAGNOSIS — N939 Abnormal uterine and vaginal bleeding, unspecified: Secondary | ICD-10-CM | POA: Insufficient documentation

## 2022-01-08 NOTE — Progress Notes (Addendum)
? ? ?GYNECOLOGY PROBLEM  VISIT ENCOUNTER NOTE ? ?Subjective:  ? Samantha Orr is a 36 y.o. G1P0 female here for a problem GYN visit.  Current complaints: AUB and referred by PCP.    ? ?Ovarian Cyst- seen on [place] in Feb. Denies right sided pain. Reviewed images independently and agree with simple appearing cyst on right.  ? ?Irregular cycles ?- present for 2-3 months ?- TSH 2/13 wnl ?- Patient has lengthy history of this complaints and often would use language for cervical cancer screening but the timeline for uterine procedures is thus:  ? ?2008 she had a DC for bleeding/cycles ?2012 Pregnancy with son, told she needed a CS because of her prior procedure ? ?2013 Ablation "for abnormal pap" at [facility] in [place] ?2015 Pregnancy with daughter ?2016 Had BTL/Hernia repair/gallbladder surgery ? ?Reports regular monthly cycles until about 3 months ago and then has only had 2 weeks at most without bleeding. Grandmother had uterine cancer and breast cancer.  ? ? ?Denies abnormal vaginal bleeding, discharge, pelvic pain, problems with intercourse or other gynecologic concerns.  ?  ?Gynecologic History ?Patient's last menstrual period was 12/31/2021 (exact date). ? ?Contraception: tubal ligation ? ?Health Maintenance Due  ?Topic Date Due  ? Hepatitis C Screening  Never done  ? ? ?The following portions of the patient's history were reviewed and updated as appropriate: allergies, current medications, past family history, past medical history, past social history, past surgical history and problem list. ? ?Review of Systems ?Pertinent items are noted in HPI. ?  ?Objective:  ?BP 120/70 (Cuff Size: Normal)   Pulse 84   Ht 5' 1.5" (1.562 m)   Wt 202 lb (91.6 kg)   LMP 12/31/2021 (Exact Date)   BMI 37.55 kg/m?  ?Gen: well appearing, NAD ?HEENT: no scleral icterus ?CV: RR ?Lung: Normal WOB ?Ext: warm well perfused ? ?PELVIC: Normal appearing external genitalia; normal appearing vaginal mucosa and cervix.  No  abnormal discharge noted.  Pap smear obtained.  Normal uterine size, no other palpable masses, no uterine or adnexal tenderness. ? ?ENDOMETRIAL BIOPSY     ?The indications for endometrial biopsy were reviewed.   Risks of the biopsy including cramping, bleeding, infection, uterine perforation, inadequate specimen and need for additional procedures  were discussed. The patient states she understands and agrees to undergo procedure today. Consent was signed. Time out was performed. Urine HCG was negative. ?During the pelvic exam, the cervix was prepped with Betadine. A single-toothed tenaculum was placed on the anterior lip of the cervix to stabilize it. The 3 mm pipelle was introduced into the endometrial cavity without difficulty to a depth of 8cm, and a moderate amount of tissue was obtained and sent to pathology. The instruments were removed from the patient's vagina. Minimal bleeding from the cervix was noted. The patient tolerated the procedure well. Routine post-procedure instructions were given to the patient.   ? ?Assessment and Plan:  ? ?1. Cyst of right ovary ?Reviewed need to follow and if not resolved by next imaging may need surgical removal of cyst.  ?Given torsion precautions ?- US PELVIS (TRANSABDOMINAL ONLY); Future ? ?2. Abnormal uterine bleeding (AUB) ?Reviewed work up -- patient agreed to EMB today ?It seems like patient had uterine lining intervention in the past and may need this again. Has several risk factor for hyperplasia and uterine carcinoma-- family hx, obesity, smoking.  ?Patient was resistant to IUD/OCP when we talked today ?- Surgical pathology ?- Cytology - PAP ?- Next  steps -- if pathology is WNL, consider hormonal control to help regular cycles vs ablation.   ? ?Please refer to After Visit Summary for other counseling recommendations.  ? ?Return in about 2 weeks (around 01/22/2022) for AUB follow up after endometrial bx, MD only. ? ?Future Appointments  ?Date Time Provider [place]  ?01/23/2022  9:20 AM Venita Lick, NP [facility]  ?01/23/2022  3:55 PM Caren Macadam, MD [facility] None  ? ? ?Caren Macadam, MD, MPH, ABFM ?Attending Physician ?[place] [facility] [place] ? ?

## 2022-01-09 LAB — SURGICAL PATHOLOGY

## 2022-01-10 LAB — CYTOLOGY - PAP
Comment: NEGATIVE
Diagnosis: NEGATIVE
High risk HPV: NEGATIVE

## 2022-01-20 NOTE — Patient Instructions (Incomplete)
COPD and Physical Activity Chronic obstructive pulmonary disease (COPD) is a long-term, or chronic, condition that affects the lungs. COPD is a general term that can be used to describe many problems that cause inflammation of the lungs and limit airflow. These conditions include chronic bronchitis and emphysema. The main symptom of COPD is shortness of breath, which makes it harder to do even simple tasks. This can also make it harder to exercise and stay active. Talk with your health care provider about treatments to help you breathe better and actions you can take to prevent breathing problems during physical activity. What are the benefits of exercising when you have COPD? Exercising regularly is an important part of a healthy lifestyle. You can still exercise and do physical activities even though you have COPD. Exercise and physical activity improve your shortness of breath by increasing blood flow (circulation). This causes your heart to pump more oxygen through your body. Moderate exercise can: Improve oxygen use. Increase your energy level. Help with shortness of breath. Strengthen your breathing muscles. Improve heart health. Help with sleep. Improve your self-esteem and feelings of self-worth. Lower depression, stress, and anxiety. Exercise can benefit everyone with COPD. The severity of your disease may affect how hard you can exercise, especially at first, but everyone can benefit. Talk with your health care provider about how much exercise is safe for you, and which activities and exercises are safe for you. What actions can I take to prevent breathing problems during physical activity? Sign up for a pulmonary rehabilitation program. This type of program may include: Education about lung diseases. Exercise classes that teach you how to exercise and be more active while improving your breathing. This usually involves: Exercise using your lower extremities, such as a stationary  bicycle. About 30 minutes of exercise, 2 to 5 times per week, for 6 to 12 weeks. Strength training, such as push-ups or leg lifts. Nutrition education. Group classes in which you can talk with others who also have COPD and learn ways to manage stress. If you use an oxygen tank, you should use it while you exercise. Work with your health care provider to adjust your oxygen for your physical activity. Your resting flow rate is different from your flow rate during physical activity. How to manage your breathing while exercising While you are exercising: Take slow breaths. Pace yourself, and do nottry to go too fast. Purse your lips while breathing out. Pursing your lips is similar to a kissing or whistling position. If doing exercise that uses a quick burst of effort, such as weight lifting: Breathe in before starting the exercise. Breathe out during the hardest part of the exercise, such as raising the weights. Where to find support You can find support for exercising with COPD from: Your health care provider. A pulmonary rehabilitation program. Your local health department or community health programs. Support groups, either online or in-person. Your health care provider may be able to recommend support groups. Where to find more information You can find more information about exercising with COPD from: American Lung Association: lung.org COPD Foundation: copdfoundation.org Contact a health care provider if: Your symptoms get worse. You have nausea. You have a fever. You want to start a new exercise program or a new activity. Get help right away if: You have chest pain. You cannot breathe. These symptoms may represent a serious problem that is an emergency. Do not wait to see if the symptoms will go away. Get medical help right away. Call   your local emergency services (911 in the [place] Do not drive yourself to the hospital. Summary COPD is a general term that can be used to describe  many different lung problems that cause lung inflammation and limit airflow. This includes chronic bronchitis and emphysema. Exercise and physical activity improve your shortness of breath by increasing blood flow (circulation). This causes your heart to provide more oxygen to your body. Contact your health care provider before starting any exercise program or new activity. Ask your health care provider what exercises and activities are safe for you. This information is not intended to replace advice given to you by your health care provider. Make sure you discuss any questions you have with your health care provider. Document Revised: 08/09/2020 Document Reviewed: 08/09/2020 Elsevier Patient Education  2022 Elsevier Inc.  

## 2022-01-23 ENCOUNTER — Ambulatory Visit: Payer: Medicaid Other | Admitting: Family Medicine

## 2022-01-23 ENCOUNTER — Ambulatory Visit: Payer: Medicaid Other | Admitting: Nurse Practitioner

## 2022-01-23 ENCOUNTER — Telehealth: Payer: Self-pay | Admitting: Nurse Practitioner

## 2022-01-23 NOTE — Telephone Encounter (Signed)
Tried to call pt back to schedule appt. Unable to leave vm. Pt wanted Jolene to speak with her directly but I let her know that scheduling an appointment would be best to discuss any questions she may have. ?

## 2022-01-23 NOTE — Telephone Encounter (Signed)
Copied from CRM [id number]. Topic: General - Call Back - No Documentation ?>> Jan 23, 2022  9:58 AM Randol Kern wrote: ?Reason for CRM: Pt called requesting to speak to [facility], says she was unable to come to her 9:20 appt. Wants to know if PCP wants her to wait to see until after her appt 04/18 with [facility]. She will have her consultation that day.  ?Best contact: [phone] ?

## 2022-01-23 NOTE — Telephone Encounter (Signed)
Called pt to let her know Jolene's recommendation. Pt was confused during the call. I let her know that Jolene would like to see her after her appt on 4/18. Pt hung up before I could schedule the appt. ?

## 2022-01-30 ENCOUNTER — Encounter: Payer: Self-pay | Admitting: Obstetrics and Gynecology

## 2022-01-30 ENCOUNTER — Ambulatory Visit: Payer: Medicaid Other | Admitting: Obstetrics and Gynecology

## 2022-01-30 VITALS — BP 128/72 | Ht 61.0 in | Wt 203.0 lb

## 2022-01-30 DIAGNOSIS — N83202 Unspecified ovarian cyst, left side: Secondary | ICD-10-CM | POA: Diagnosis not present

## 2022-01-30 NOTE — Progress Notes (Signed)
36 yo here for management of AUB. Patient reports a normal 7 day monthly period, heavy in flow. She states that she is here to discuss a procedure. Patient is aware that a follow up ultrasound is needed to follow up on an ovarian cyst note in February. Patient denies pelvic pain or any complaints. She repeated that she was here to discuss a procedure. When I questioned the patient specifically on what she was hoping the procedure would solve, she asked me to review her 2008 records as she had the same procedure done at that time. She described the procedure as a colposcopy which she understood was needed for abnormal pap smear. She stated that she did not ask for any procedures to be done to her, a procedure was recommended for her and hence the reason for her visit. She would have preferred to take medication instead. I still was not able to understand what problem needed to be solved by either medication or surgical intervention. Patient became frustrated with me and again asked that I reviewed her procedure list from 2008. ? ? ?Past Medical History:  ?Diagnosis Date  ? Abscess   ? Complication of anesthesia   ? hr/bp dropped after c/s 2015 in pacu   had spinal  ? GERD (gastroesophageal reflux disease)   ? sometimes throws up when gas gets in her chest  ? Shortness of breath dyspnea   ? ?Past Surgical History:  ?Procedure Laterality Date  ? ABCESS DRAINAGE    ? BUTTOCK  ? ABDOMINAL SURGERY    ? tubal, hernia and gallbladder  ? CESAREAN SECTION    ? CESAREAN SECTION  2012 and 2015  ? CHOLECYSTECTOMY    ? COLPOSCOPY  2015  ? DILATION AND CURETTAGE OF UTERUS    ? GALLBLADDER SURGERY  2016  ? HERNIA REPAIR    ? HYDRADENITIS EXCISION Bilateral 06/05/2016  ? Procedure: , right axilla;  Surgeon: Jules Husbands, MD;  Location: [facility] ORS;  Service: General;  Laterality: Bilateral;  ? HYDRADENITIS EXCISION Left 12/03/2019  ? Procedure: EXCISION HIDRADENITIS AXILLA Left;  Surgeon: Jules Husbands, MD;  Location: [facility] ORS;   Service: General;  Laterality: Left;  ? HYDRADENITIS EXCISION Left   ? INGUINAL HERNIA REPAIR  2016  ? TUBAL LIGATION    ? TUBAL LIGATION    ? TUBAL LIGATION  2016  ? ?Family History  ?Problem Relation Age of Onset  ? Hypertension Mother   ? Breast cancer Maternal Aunt   ? Uterine cancer Maternal Grandmother   ? Diabetes Maternal Grandfather   ? Breast cancer Maternal Grandfather   ?     60  ? ?Social History  ? ?Tobacco Use  ? Smoking status: Every Day  ?  Packs/day: 1.00  ?  Types: Cigarettes  ? Smokeless tobacco: Never  ?Vaping Use  ? Vaping Use: Never used  ?Substance Use Topics  ? Alcohol use: Yes  ?  Alcohol/week: 6.0 standard drinks  ?  Types: 6 Cans of beer per week  ?  Comment: weekly  ? Drug use: No  ? ?ROS ?See pertinent in HPI. All other systems reviewed and non contributory ? ?GENERAL: Well-developed, well-nourished female in no acute distress.  ?NEURO: alert and oriented x 3 ? ?US Pelvic Complete With Transvaginal ? ?Result Date: 12/05/2021 ?CLINICAL DATA:  Vaginal bleeding EXAM: TRANSABDOMINAL AND TRANSVAGINAL ULTRASOUND OF PELVIS TECHNIQUE: Both transabdominal and transvaginal ultrasound examinations of the pelvis were performed. Transabdominal technique was performed for global imaging of  the pelvis including uterus, ovaries, adnexal regions, and pelvic cul-de-sac. It was necessary to proceed with endovaginal exam following the transabdominal exam to visualize the uterus endometrium ovaries. COMPARISON:  None FINDINGS: Uterus Measurements: 8.8 x 4.4 x 5.1 cm = volume: 104.1 mL. No fibroids or other mass visualized. Endometrium Thickness: 9.4 mm.  No focal abnormality visualized. Right ovary Measurements: 3.3 x 2.1 x 2.7 cm = volume: 9.9 mL. Normal appearance/no adnexal mass. Left ovary Measurements: 5.6 x 5.7 x 5.8 cm = volume: 96.2 mL. Cyst measuring 5.2 x 5.2 x 5.4 cm. Other findings No abnormal free fluid. IMPRESSION: 1. Normal premenopausal endometrial thickness. 2. 5.4 cm left ovarian cyst.  Recommend follow-up US in 3-6 months. Note: This recommendation does not apply to premenarchal patients or to those with increased risk (genetic, family history, elevated tumor markers or other high-risk factors) of ovarian cancer. Reference: Radiology 2019 Nov; 293(2):359-371. Electronically Signed   By: Donavan Foil M.D.   On: 12/05/2021 16:46   ? ? ?A/P 36 yo with questionable abnormal uterine bleeding and a left ovarian cyst ?- Reviewed ultrasound report with the patient ?- Agreed to have pelvic ultrasound repeated in May-June ?- As I walked out of the room, patient called me incompetent as I did not review her 2008 procedure lists. Patient proceeded to use foul language towards me and stated that "she would wait for me in the parking lot to teach me something". At which point, I asked staff members to call security ?- Patient will be scheduled for her ultrasound and will be referred to follow up with a different provider ? ?

## 2022-02-09 ENCOUNTER — Telehealth: Payer: Self-pay | Admitting: Nurse Practitioner

## 2022-02-09 DIAGNOSIS — N83202 Unspecified ovarian cyst, left side: Secondary | ICD-10-CM | POA: Insufficient documentation

## 2022-02-09 NOTE — Telephone Encounter (Signed)
Routing to provider for new referral

## 2022-02-09 NOTE — Telephone Encounter (Signed)
Pt is calling to request a new referral to a new ob/gyn for a constant for procedure. Pt went to Dr. Jolayne Panther. Pt did not feel comfortable with Dr. Jolayne Panther is requesting a new referral. Please advise CB- [phone] ?

## 2022-02-23 ENCOUNTER — Other Ambulatory Visit: Payer: Self-pay | Admitting: Family Medicine

## 2022-02-23 DIAGNOSIS — N83201 Unspecified ovarian cyst, right side: Secondary | ICD-10-CM

## 2022-02-26 ENCOUNTER — Ambulatory Visit
Admission: RE | Admit: 2022-02-26 | Discharge: 2022-02-26 | Disposition: A | Discharge status: Home / Self Care | Payer: Medicaid Other | Source: Ambulatory Visit | Attending: Family Medicine | Admitting: Family Medicine

## 2022-02-26 DIAGNOSIS — N83201 Unspecified ovarian cyst, right side: Secondary | ICD-10-CM | POA: Insufficient documentation

## 2022-02-26 DIAGNOSIS — N854 Malposition of uterus: Secondary | ICD-10-CM | POA: Diagnosis not present

## 2022-02-26 DIAGNOSIS — N858 Other specified noninflammatory disorders of uterus: Secondary | ICD-10-CM | POA: Diagnosis not present

## 2022-02-26 DIAGNOSIS — N83209 Unspecified ovarian cyst, unspecified side: Secondary | ICD-10-CM | POA: Diagnosis not present

## 2022-02-28 DIAGNOSIS — Z8619 Personal history of other infectious and parasitic diseases: Secondary | ICD-10-CM | POA: Diagnosis not present

## 2022-02-28 DIAGNOSIS — N939 Abnormal uterine and vaginal bleeding, unspecified: Secondary | ICD-10-CM | POA: Diagnosis not present

## 2022-02-28 DIAGNOSIS — L732 Hidradenitis suppurativa: Secondary | ICD-10-CM | POA: Diagnosis not present

## 2022-03-02 ENCOUNTER — Other Ambulatory Visit: Payer: Self-pay

## 2022-03-02 ENCOUNTER — Encounter: Payer: Self-pay | Admitting: Intensive Care

## 2022-03-02 ENCOUNTER — Emergency Department
Admission: EM | Admit: 2022-03-02 | Discharge: 2022-03-02 | Disposition: A | Discharge status: Home / Self Care | Payer: Medicaid Other | Attending: Emergency Medicine | Admitting: Emergency Medicine

## 2022-03-02 DIAGNOSIS — L739 Follicular disorder, unspecified: Secondary | ICD-10-CM | POA: Insufficient documentation

## 2022-03-02 MED ORDER — CLINDAMYCIN HCL 150 MG PO CAPS
300.0000 mg | ORAL_CAPSULE | Freq: Once | ORAL | Status: AC
  Administered 2022-03-02: 300 mg via ORAL
  Filled 2022-03-02: qty 2

## 2022-03-02 MED ORDER — MUPIROCIN 2 % EX OINT: 1.0000 "application " | TOPICAL_OINTMENT | Freq: Two times a day (BID) | CUTANEOUS | 0 refills | Status: AC

## 2022-03-02 MED ORDER — CLINDAMYCIN HCL 300 MG PO CAPS: 300.0000 mg | ORAL_CAPSULE | Freq: Three times a day (TID) | ORAL | 0 refills | Status: AC

## 2022-03-02 NOTE — ED Triage Notes (Signed)
Patient reports abscess on right and left inner thigh x5 days.

## 2022-03-02 NOTE — Discharge Instructions (Addendum)
Take Clindamycin three times daily for the next seven days.  

## 2022-03-02 NOTE — ED Provider Notes (Signed)
[facility] Provider Note  Patient Contact: 5:16 PM (approximate)   History   Abscess   HPI  Samantha Orr is a 36 y.o. female presents to the emergency department with 2 regions of folliculitis along the bilateral inner thighs.  Patient states that she is prone to abscesses and is requesting a prescription for clindamycin as she states that this is worked well for her in the past.  She denies fever or chills and declines incision and drainage in the ED.      Physical Exam   Triage Vital Signs: ED Triage Vitals  Enc Vitals Group     BP 03/02/22 1433 127/70     Pulse Rate 03/02/22 1433 77     Resp 03/02/22 1433 14     Temp 03/02/22 1433 98.4 F (36.9 C)     Temp Source 03/02/22 1433 Oral     SpO2 03/02/22 1433 100 %     Weight 03/02/22 1419 200 lb (90.7 kg)     Height 03/02/22 1419 5\' 2"  (1.575 m)     Head Circumference --      Peak Flow --      Pain Score 03/02/22 1419 10     Pain Loc --      Pain Edu? --      Excl. in GC? --     Most recent vital signs: Vitals:   03/02/22 1433  BP: 127/70  Pulse: 77  Resp: 14  Temp: 98.4 F (36.9 C)  SpO2: 100%     General: Alert and in no acute distress. Eyes:  PERRL. EOMI. Head: No acute traumatic findings ENT:      Ears:       Nose: No congestion/rhinnorhea.      Mouth/Throat: Mucous membranes are moist. Neck: No stridor. No cervical spine tenderness to palpation. Cardiovascular:  Good peripheral perfusion Respiratory: Normal respiratory effort without tachypnea or retractions. Lungs CTAB. Good air entry to the bases with no decreased or absent breath sounds. Gastrointestinal: Bowel sounds 4 quadrants. Soft and nontender to palpation. No guarding or rigidity. No palpable masses. No distention. No CVA tenderness. Musculoskeletal: Full range of motion to all extremities.  Neurologic:  No gross focal neurologic deficits are appreciated.  Skin: Patient has 2 small regions of folliculitis  along the bilateral inner thighs. Other:   ED Results / Procedures / Treatments   Labs (all labs ordered are listed, but only abnormal results are displayed) Labs Reviewed - No data to display     PROCEDURES:  Critical Care performed: No  Procedures   MEDICATIONS ORDERED IN ED: Medications  clindamycin (CLEOCIN) capsule 300 mg (300 mg Oral Given 03/02/22 1706)     IMPRESSION / MDM / ASSESSMENT AND PLAN / ED COURSE  I reviewed the triage vital signs and the nursing notes.                              Assessment and plan Folliculitis 36 year old female presents to the emergency department with two 1 cm x 1 cm regions of folliculitis along bilateral inner thighs.  I offered to conduct incision and drainage but patient declined stating that she would like a prescription for clindamycin as this is worked well for her in the past.  I also prescribed patient topical mupirocin.  Return precautions were given to return with new or worsening symptoms.  All patient questions were answered.  FINAL CLINICAL IMPRESSION(S) / ED DIAGNOSES   Final diagnoses:  Folliculitis     Rx / DC Orders   ED Discharge Orders          Ordered    clindamycin (CLEOCIN) 300 MG capsule  3 times daily        03/02/22 1651    mupirocin ointment (BACTROBAN) 2 %  2 times daily        03/02/22 1652             Note:  This document was prepared using Dragon voice recognition software and may include unintentional dictation errors.   Pia Mau Mancelona, PA-C 03/02/22 1719    Concha Se, MD 03/02/22 [phone]

## 2022-03-05 ENCOUNTER — Telehealth: Payer: Self-pay

## 2022-03-05 NOTE — Telephone Encounter (Signed)
Transition Care Management Follow-up Telephone Call Date of discharge and from where: 03/02/2022 from [facility] How have you been since you were released from the hospital? Patient stated that she is feeling well and did not have any questions at this time.  Any questions or concerns? No  Items Reviewed: Did the pt receive and understand the discharge instructions provided? Yes  Medications obtained and verified? Yes  Other? No  Any new allergies since your discharge? No  Dietary orders reviewed? No Do you have support at home? Yes   Functional Questionnaire: (I = Independent and D = Dependent) ADLs: I  Bathing/Dressing- I  Meal Prep- I  Eating- I  Maintaining continence- I  Transferring/Ambulation- I  Managing Meds- I   Follow up appointments reviewed:  PCP Hospital f/u appt confirmed? No  Specialist Hospital f/u appt confirmed? No  Are transportation arrangements needed? No  If their condition worsens, is the pt aware to call PCP or go to the Emergency Dept.? Yes Was the patient provided with contact information for the PCP's office or ED? Yes Was to pt encouraged to call back with questions or concerns? Yes

## 2022-03-19 ENCOUNTER — Other Ambulatory Visit: Payer: Self-pay | Admitting: Nurse Practitioner

## 2022-03-20 NOTE — Telephone Encounter (Signed)
Requested medication (s) are due for refill today:   No for both  Requested medication (s) are on the active medication list:   No for both  Future visit scheduled:   No   Last ordered: Neither one of these drugs is listed on the medication file reason returned.   Requested Prescriptions  Pending Prescriptions Disp Refills   metroNIDAZOLE (FLAGYL) 500 MG tablet [Pharmacy Med Name: metroNIDAZOLE 500 MG Oral Tablet] 14 tablet 0    Sig: Take 1 tablet by mouth twice daily for 7 days     Off-Protocol Failed - 03/19/2022  2:42 PM      Failed - Medication not assigned to a protocol, review manually.      Passed - Valid encounter within last 12 months    Recent Outpatient Visits           3 months ago Chronic obstructive pulmonary disease, unspecified COPD type ([place])   [place], Barbaraann Faster, NP   3 months ago Vaginal bleeding   [place], [place], DO   8 months ago Hidradenitis suppurativa   [place], Barbaraann Faster, NP   1 year ago Hidradenitis suppurativa   [place], Barbaraann Faster, NP   1 year ago Hidradenitis suppurativa   [place], [place] T, NP                mupirocin ointment (BACTROBAN) 2 % [Pharmacy Med Name: Mupirocin 2 % External Ointment] 22 g 0    Sig: APPLY TOPICALLY TWICE DAILY     Off-Protocol Failed - 03/19/2022  2:42 PM      Failed - Medication not assigned to a protocol, review manually.      Passed - Valid encounter within last 12 months    Recent Outpatient Visits           3 months ago Chronic obstructive pulmonary disease, unspecified COPD type ([place])   [place], Barbaraann Faster, NP   3 months ago Vaginal bleeding   [place], [place], DO   8 months ago Hidradenitis suppurativa   [place], Barbaraann Faster, NP   1 year ago Hidradenitis suppurativa   [place], Barbaraann Faster,  NP   1 year ago Hidradenitis suppurativa   [place], Barbaraann Faster, NP

## 2022-03-20 NOTE — Telephone Encounter (Signed)
Attempted to call patient to inquire if she needed to be seen due to what the prescriptions are for. Unable to leave vm.

## 2022-05-18 ENCOUNTER — Emergency Department: Payer: Medicaid Other

## 2022-05-18 ENCOUNTER — Emergency Department
Admission: EM | Admit: 2022-05-18 | Discharge: 2022-05-18 | Disposition: A | Discharge status: Home / Self Care | Payer: Medicaid Other | Attending: Emergency Medicine | Admitting: Emergency Medicine

## 2022-05-18 ENCOUNTER — Encounter: Payer: Self-pay | Admitting: Emergency Medicine

## 2022-05-18 DIAGNOSIS — M79641 Pain in right hand: Secondary | ICD-10-CM | POA: Diagnosis not present

## 2022-05-18 NOTE — Discharge Instructions (Signed)
As we discussed, you should follow-up with a hand/arm specialist.  We recommend that you call the office of Dr. Stephenie Acres to schedule the next available follow-up appointment. In the meantime, use over-the-counter Tylenol and ibuprofen, and use the provided wrist splint for comfort.

## 2022-05-18 NOTE — ED Triage Notes (Signed)
Pt presents via POV with complaints of right hand pain for the last several months with increased severity over the last day. 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 CNS intact - cap refill <3 secs.

## 2022-05-18 NOTE — ED Provider Notes (Signed)
[facility] Provider Note    Event Date/Time   First MD Initiated Contact with Patient 05/18/22 [phone]     (approximate)   History   No chief complaint on file.   HPI  Samantha Orr is a 36 y.o. female with no chronic medical issues who presents for evaluation of pain in her right hand and wrist that has been going on for months.  She said it has been worse over the last day.  It mostly hurts in the middle, ring, and little fingers, but radiates down through her hand and into her wrist.  It does not extend further up her arm.  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  No recent trauma.  Symptoms have been present for months.  No numbness nor tingling at the moment but sometimes she feels numb and tingling.  Nothing in particular seems to make it better.  She has not gone to see anyone about it or tried taking anything for it.     Physical Exam   Triage Vital Signs: ED Triage Vitals [05/18/22 0311]  Enc Vitals Group     BP 132/74     Pulse Rate 94     Resp 18     Temp 97.9 F (36.6 C)     Temp Source Oral     SpO2 100 %     Weight 91.7 kg (202 lb 1.6 oz)     Height 1.575 m (5\' 2" )     Head Circumference      Peak Flow      Pain Score 10     Pain Loc      Pain Edu?      Excl. in GC?     Most recent vital signs: Vitals:   05/18/22 0311  BP: 132/74  Pulse: 94  Resp: 18  Temp: 97.9 F (36.6 C)  SpO2: 100%     General: Awake, no distress.  CV:  Good peripheral perfusion.  Easily palpable right radial pulse. Resp:  Normal effort.  Abd:  No distention.  Other:  No gross deformities to hand or wrist.  Patient is able to grip my fingers but says it hurts to do so.  No snuffbox tenderness to palpation or any specific tenderness to palpation along the ligamentous or tendon lines in the wrist.   ED Results / Procedures / Treatments   RADIOLOGY I viewed and interpreted the patient's right  hand x-rays.  I see no evidence of bony injury, acute or chronic.  I also read the radiologist's report, which confirmed no acute findings.    PROCEDURES:  Critical Care performed: No  Procedures   MEDICATIONS ORDERED IN ED: Medications - No data to display   IMPRESSION / MDM / ASSESSMENT AND PLAN / ED COURSE  I reviewed the triage vital signs and the nursing notes.                              Differential diagnosis includes, but is not limited to, chronic repetitive motion injury, fracture or dislocation, neuropathy, specifically ulnar nerve.  Patient's presentation is most consistent with acute complicated illness / injury requiring diagnostic workup.  Symptoms have been present for months.  Vital signs are stable and within normal limits.  Neurovascularly intact.  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  No specific fix tonight.  Exam is not consistent with carpal tunnel syndrome, but I provided a Velcro wrist splint for comfort and strongly encouraged her to follow-up with Dr. Stephenie Acres for further assessment by an upper extremitiy specialist.  She said that she understands and agrees with the plan.       FINAL CLINICAL IMPRESSION(S) / ED DIAGNOSES   Final diagnoses:  Right hand pain     Rx / DC Orders   ED Discharge Orders     None        Note:  This document was prepared using Dragon voice recognition software and may include unintentional dictation errors.   Loleta Rose, MD 05/18/22 [phone]

## 2022-05-18 NOTE — ED Notes (Signed)
Pt DC to home. DC instructions reviewed with all questions answered. Pt verbalizes understanding. Advised pt not able to extended work excuse to reflect 08/03 date as she did not present to ED until 08/04. Pt verbalizes understanding. Pt ambulatory out of dept with steady gait with family member.

## 2022-05-30 ENCOUNTER — Ambulatory Visit: Payer: Self-pay | Admitting: *Deleted

## 2022-05-30 NOTE — Telephone Encounter (Signed)
Reviewed patient's last OV note from the end of February. Patient was supposed to return to the office 6 weeks later for a follow up and to have a Spirometry test done. This is a breathing test.   Please call and schedule the patient an appointment to come in to see Jolene and do this test as instructed by Jolene.

## 2022-05-30 NOTE — Telephone Encounter (Signed)
Reason for Disposition  Oxygen level (e.g., pulse oximetry) 91 to 94 percent    This does not pertain to oxygen level.   She wants to talk with Jolene about COPD.    See notes for details.  Answer Assessment - Initial Assessment Questions 1. RESPIRATORY STATUS: "Describe your breathing?" (e.g., wheezing, shortness of breath, unable to speak, severe coughing)      Having shortness of breath.    I'm a Engineer, manufacturing systems.   I sand metal.     Pt then changed the subject asking does Jolene want me to go there or see Jolene first?   I asked her to go where?   Jolene said something about me going to a chest examination place in [place] for the COPD.   Does she want me to go there first or come see her first?   I looked in chart but could not find anything in reference to what pt was talking about.    Pt then asked if Jolene could call her.   I let her know I would send the message to [facility] and have someone give pt a call back.  I thought I was supposed to go to a chest exam place.       2. ONSET: "When did this breathing problem begin?"  I asked her about being short of breath and having black mucus in her nose because that's what the agent that transferred the call to me said she was calling about.   She then said something about an inhaler.   I asked if she needed an inhaler.   "No, I have the inhaler".   "Do you see anything about the COPD?"       I let her know to talk with Jolene regarding this because I wasn't sure what she was referring to.   Pt asked me to please have Jolene call her.  "What are my next steps for the COPD and chest exam place?"  No triage done as she wanted to talk with Jolene about the chest exam place in [place].  I sent this information to Aura Dials, NP      3. PATTERN "Does the difficult breathing come and go, or has it been constant since it started?"      4. SEVERITY: "How bad is your breathing?" (e.g., mild, moderate, severe)    - MILD: No SOB at rest, mild SOB  with walking, speaks normally in sentences, can lie down, no retractions, pulse < 100.    - MODERATE: SOB at rest, SOB with minimal exertion and prefers to sit, cannot lie down flat, speaks in phrases, mild retractions, audible wheezing, pulse 100-120.    - SEVERE: Very SOB at rest, speaks in single words, struggling to breathe, sitting hunched forward, retractions, pulse > 120      N/A 5. RECURRENT SYMPTOM: "Have you had difficulty breathing before?" If Yes, ask: "When was the last time?" and "What happened that time?"      N/A 6. CARDIAC HISTORY: "Do you have any history of heart disease?" (e.g., heart attack, angina, bypass surgery, angioplasty)      N/A 7. LUNG HISTORY: "Do you have any history of lung disease?"  (e.g., pulmonary embolus, asthma, emphysema)     She says she has COPD 8. CAUSE: "What do you think is causing the breathing problem?"      N/A 9. OTHER SYMPTOMS: "Do you have any other symptoms? (e.g., dizziness, runny nose, cough, chest pain, fever)  She sounded like she had nasal congestion while talking with her on the phone.   She was sniffing and coughed at one point. 10. O2 SATURATION MONITOR:  "Do you use an oxygen saturation monitor (pulse oximeter) at home?" If Yes, ask: "What is your reading (oxygen level) today?" "What is your usual oxygen saturation reading?" (e.g., 95%)       N/A 11. PREGNANCY: "Is there any chance you are pregnant?" "When was your last menstrual period?"       Not asked 12. TRAVEL: "Have you traveled out of the country in the last month?" (e.g., travel history, exposures)       N/A  Protocols used: Breathing Difficulty-A-AH

## 2022-05-30 NOTE — Telephone Encounter (Signed)
Pt scheduled  

## 2022-05-30 NOTE — Telephone Encounter (Signed)
  Chief Complaint: Pt wanting to know if she should see Jolene first or go to the "chest exam place in [place]" first.   Asking about her COPD.   What are the next steps.   She asked for Jolene to call her. Symptoms: COPD and next steps for "chest exam place in [place]" Frequency: N/A Pertinent Negatives: Patient denies N/A Disposition: [] ED /[] Urgent Care (no appt availability in office) / [] Appointment(In office/virtual)/ []  [facility]/ [] Home Care/ [] Refused Recommended Disposition /[] [facility]/ [x]  Follow-up with PCP Additional Notes: See notes.   I was informed pt was calling in for shortness of breath and black mucus coming from her nose but when she was transferred to me the conversation was totally different and I was unfamiliar with what she is asking.    Did not see anything in her chart to assist me in answering her questions regarding COPD and a "Chest exam place in [place]".    She is requesting Jolene give her a call.   Number in chart is correct.

## 2022-05-31 ENCOUNTER — Ambulatory Visit: Payer: Medicaid Other | Admitting: Nurse Practitioner

## 2022-05-31 DIAGNOSIS — J449 Chronic obstructive pulmonary disease, unspecified: Secondary | ICD-10-CM

## 2022-06-01 ENCOUNTER — Ambulatory Visit: Payer: Medicaid Other | Admitting: Nurse Practitioner

## 2022-06-01 ENCOUNTER — Encounter: Payer: Self-pay | Admitting: Nurse Practitioner

## 2022-06-01 VITALS — BP 108/71 | HR 64 | Temp 97.5°F | Wt 195.8 lb

## 2022-06-01 DIAGNOSIS — J329 Chronic sinusitis, unspecified: Secondary | ICD-10-CM

## 2022-06-01 DIAGNOSIS — J449 Chronic obstructive pulmonary disease, unspecified: Secondary | ICD-10-CM | POA: Diagnosis not present

## 2022-06-01 DIAGNOSIS — Z6835 Body mass index (BMI) 35.0-35.9, adult: Secondary | ICD-10-CM

## 2022-06-01 DIAGNOSIS — Z1159 Encounter for screening for other viral diseases: Secondary | ICD-10-CM | POA: Diagnosis not present

## 2022-06-01 DIAGNOSIS — E66812 Obesity, class 2: Secondary | ICD-10-CM

## 2022-06-01 DIAGNOSIS — E6609 Other obesity due to excess calories: Secondary | ICD-10-CM | POA: Diagnosis not present

## 2022-06-01 DIAGNOSIS — F17210 Nicotine dependence, cigarettes, uncomplicated: Secondary | ICD-10-CM | POA: Diagnosis not present

## 2022-06-01 NOTE — Assessment & Plan Note (Signed)
BMI 35.81.  Recommended eating smaller high protein, low fat meals more frequently and exercising 30 mins a day 5 times a week with a goal of 10-15lb weight loss in the next 3 months. Patient voiced their understanding and motivation to adhere to these recommendations.  

## 2022-06-01 NOTE — Assessment & Plan Note (Signed)
I have recommended complete cessation of tobacco use. I have discussed various options available for assistance with tobacco cessation including over the counter methods (Nicotine gum, patch and lozenges). We also discussed prescription options (Chantix, Nicotine Inhaler / Nasal Spray). The patient is not interested in pursuing any prescription tobacco cessation options at this time.  

## 2022-06-01 NOTE — Progress Notes (Signed)
BP 108/71   Pulse 64   Temp (!) 97.5 F (36.4 C) (Oral)   Wt 195 lb 12.8 oz (88.8 kg)   LMP 05/25/2022 (Exact Date)   SpO2 97%   BMI 35.81 kg/m    Subjective:    Patient ID: Samantha Orr, female    DOB: 02-Mar-1986, 36 y.o.   MRN: [id number]  HPI: Samantha Orr is a 36 y.o. female  Chief Complaint  Patient presents with   COPD   COPD Noted on past imaging, she is concerned about this -- was to return months back for spirometry but missed visits.  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  Does not always wear mask -- sometimes is in hurry.  Continues to struggle with congestion.    Currently is using Anoro daily, which she reports is offering benefit.  Continues on Claritin + Flonase -- occasionally uses Singulair.  Uses Proair 2 times a week, prior to current job it was 2 times a month.   COPD status: stable Satisfied with current treatment?: yes Oxygen use: no Dyspnea frequency: occasional -- this week twice while at work Cough frequency: no Rescue inhaler frequency:  2 times a week Limitation of activity: no Productive cough: none Last Spirometry: 06/01/22 FEV 1112% and FEV1/FVC 106% Pneumovax: Not up to Date --refuses Influenza: Not up to Date -- refuses   Relevant past medical, surgical, family and social history reviewed and updated as indicated. Interim medical history since our last visit reviewed. Allergies and medications reviewed and updated.  Review of Systems  Constitutional:  Negative for activity change, appetite change, diaphoresis, fatigue and fever.  HENT:  Positive for congestion, postnasal drip and rhinorrhea. Negative for ear discharge, ear pain, sinus pressure, sinus pain and sore throat.   Respiratory:  Negative for cough, chest tightness, shortness of breath and wheezing.   Cardiovascular:  Negative for chest pain, palpitations and leg swelling.   Gastrointestinal: Negative.   Neurological: Negative.   Psychiatric/Behavioral: Negative.     Per HPI unless specifically indicated above     Objective:    BP 108/71   Pulse 64   Temp (!) 97.5 F (36.4 C) (Oral)   Wt 195 lb 12.8 oz (88.8 kg)   LMP 05/25/2022 (Exact Date)   SpO2 97%   BMI 35.81 kg/m   Wt Readings from Last 3 Encounters:  06/01/22 195 lb 12.8 oz (88.8 kg)  05/18/22 202 lb 1.6 oz (91.7 kg)  03/02/22 200 lb (90.7 kg)    Physical Exam Vitals and nursing note reviewed.  Constitutional:      General: She is awake.     Appearance: She is well-developed.  HENT:     Head: Normocephalic.     Right Ear: Hearing, tympanic membrane, ear canal and external ear normal.     Left Ear: Hearing, tympanic membrane, ear canal and external ear normal.     Nose: Congestion and rhinorrhea present. Rhinorrhea is clear.     Right Turbinates: Swollen.     Left Turbinates: Swollen.     Right Sinus: No maxillary sinus tenderness or frontal sinus tenderness.     Left Sinus: No maxillary sinus tenderness or frontal sinus tenderness.     Mouth/Throat:     Mouth: Mucous membranes are moist.     Pharynx: Posterior oropharyngeal erythema (with cobblestone appearance) present. No pharyngeal swelling or oropharyngeal  exudate.  Eyes:     General: Lids are normal.        Right eye: No discharge.        Left eye: No discharge.     Conjunctiva/sclera: Conjunctivae normal.     Pupils: Pupils are equal, round, and reactive to light.  Neck:     Thyroid: No thyromegaly.     Vascular: No carotid bruit or JVD.  Cardiovascular:     Rate and Rhythm: Normal rate and regular rhythm.     Heart sounds: Normal heart sounds. No murmur heard.    No gallop.  Pulmonary:     Effort: Pulmonary effort is normal.     Breath sounds: Normal breath sounds.  Abdominal:     General: Bowel sounds are normal.     Palpations: Abdomen is soft.  Musculoskeletal:     Cervical back: Normal range of motion and  neck supple.     Right lower leg: No edema.     Left lower leg: No edema.  Lymphadenopathy:     Cervical: No cervical adenopathy.  Skin:    General: Skin is warm and dry.  Neurological:     Mental Status: She is alert and oriented to person, place, and time.  Psychiatric:        Attention and Perception: Attention normal.        Mood and Affect: Mood normal.        Behavior: Behavior normal. Behavior is cooperative.        Thought Content: Thought content normal.        Judgment: Judgment normal.     Results for orders placed or performed in visit on 01/08/22  Surgical pathology  Result Value Ref Range   SURGICAL PATHOLOGY      SURGICAL PATHOLOGY CASE: [id number] PATIENT: Samantha Orr Surgical Pathology Report     Clinical History: AUB for 2 months, family history of uterine cancer, tobacco use and obesity are risk factors for endometrial hyperplasia (cm)     FINAL MICROSCOPIC DIAGNOSIS:  A. ENDOMETRIUM, BIOPSY: - Benign proliferative endometrium - Negative for hyperplasia or malignancy       GROSS DESCRIPTION:  The specimen is received in formalin and consists of a 1.9 x 1.5 x 0.3 cm aggregate of tan-red soft tissue.  The specimen is entirely submitted in 1 cassette.  Lovey Newcomer 01/08/2022)    Final Diagnosis performed by Holley Bouche, MD.   Electronically signed 01/09/2022 Technical component performed at [facility]. [facility], [address], [place], [place] [address].  Professional component performed at [facility], [address], [place], [place] [address].  Immunohistochemistry Technical component (if applicable) was performed at [facility]. [address], [place], [place] [address].   IMMUNOHISTOCHEMISTRY DISCLAIMER (if applicable): Some of these immunohistochemical stains may have been developed and the performance characteristics determine by [facility]. Some may not  have been cleared or approved by the U.S. Food and Drug Administration. The FDA has determined that such clearance or approval is not necessary. This test is used for clinical purposes. It should not be regarded as investigational or for research. This laboratory is certified under the Clinical Laboratory Improvement Amendments of 1988 (CLIA-88) as qualified to perform high complexity clinical laboratory testing.  The controls stained appropriately.   Cytology - PAP  Result Value Ref Range   High risk HPV Negative    Adequacy      Satisfactory for evaluation; transformation  zone component PRESENT.   Diagnosis      - Negative for intraepithelial lesion or malignancy (NILM)   Comment Normal Reference Range HPV - Negative       Assessment & Plan:   Problem List Items Addressed This Visit       Respiratory   Chronic congestion of paranasal sinus    Chronic issue, has tried multiple medications without benefit.  Continues to be very congested daily.  Recommend she wear mask at work all of the time to prevent exposures.  Continue current medication regimen.  Referral to ENT for further assessment.      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  Recommend complete cessation of smoking.  FEV 1112% and FEV1/FVC 106% today. Continue Anoro as is offering benefit and use Proair as needed.  Recommend she wear mask at work all the time to minimize exposure in longs and sinuses.  Return in January for physical.  Labs today.      Relevant Orders   Spirometry with Graph (Completed)   CBC with Differential/Platelet   Comprehensive metabolic panel     Other   Nicotine dependence, cigarettes, uncomplicated    I have recommended complete cessation of tobacco use. I have discussed various options available for assistance with  tobacco cessation including over the counter methods (Nicotine gum, patch and lozenges). We also discussed prescription options (Chantix, Nicotine Inhaler / Nasal Spray). The patient is not interested in pursuing any prescription tobacco cessation options at this time.      Obesity    BMI 35.81.  Recommended eating smaller high protein, low fat meals more frequently and exercising 30 mins a day 5 times a week with a goal of 10-15lb weight loss in the next 3 months. Patient voiced their understanding and motivation to adhere to these recommendations.       Other Visit Diagnoses     Need for hepatitis C screening test       Hep C screening on labs today, discussed with patient.   Relevant Orders   Hepatitis C antibody        Follow up plan: Return in about 5 months (around 11/05/2022) for Annual physical.

## 2022-06-01 NOTE — Assessment & Plan Note (Signed)
Chronic issue, has tried multiple medications without benefit.  Continues to be very congested daily.  Recommend she wear mask at work all of the time to prevent exposures.  Continue current medication regimen.  Referral to ENT for further assessment.

## 2022-06-01 NOTE — Assessment & Plan Note (Signed)
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  Recommend complete cessation of smoking.  FEV 1112% and FEV1/FVC 106% today. Continue Anoro as is offering benefit and use Proair as needed.  Recommend she wear mask at work all the time to minimize exposure in longs and sinuses.  Return in January for physical.  Labs today.

## 2022-06-01 NOTE — Patient Instructions (Signed)
Living with COPD Being diagnosed with chronic obstructive pulmonary disease (COPD) changes your life physically and emotionally. Having COPD can affect your ability to work and do things you enjoy. COPD is not the same for everyone, and it may change over time. Your health care providers can help you come up with the COPD management plan that works best for you. How to manage lifestyle changes Treatment plan Work closely with your health care providers. Follow your COPD management plan. This plan includes: Instructions about activities, exercises, diet, medicines, what to do when COPD flares up, and when to call your health care provider. A pulmonary rehabilitation program. In pulmonary rehab, you will learn about COPD, do exercises for fitness and breathing, and get support from health care providers and other people who have COPD. Managing emotions and stress Living with a chronic disease means you may also struggle with stressful emotions, such as sadness, fear, and worry. Here are some ways to manage these emotions: Talk to someone about your fear, anxiety, depression, or stress. Learn strategies to avoid or reduce stress and ask for help if you are struggling with depression or anxiety. Consider joining a COPD support group, online or in person.  Adjusting to changes COPD may limit the things you can do, but you can make certain changes to help you cope with the diagnosis. Ask for help when you need it. Getting support from friends, family, and your health care team is an important part of managing the condition. Try to get regular exercise as prescribed by a health care provider or pulmonary rehab team. Exercising can help COPD, even if you are a bit short of breath. Take steps to prevent infection and protect your lungs: Wash your hands often and avoid being in crowds. Stay away from friends and family members who are sick. Check your local air quality each day, and stay out of areas  where air pollution is likely. How to recognize changes in your condition Recognizing changes in your COPD COPD is a progressive disease. It is important to let the health care team know if your COPD is getting worse. Your treatment plan may need to change. Watch for: Increased shortness of breath, wheezing, cough, or fatigue. Loss of ability to exercise or perform daily activities, like climbing stairs. More frequent symptom flares. Signs of depression or anxiety. Recognizing stress It is normal to have additional stress when you have COPD. However, prolonged stress and anxiety can make COPD worse and lead to depression. Recognize the warning signs, which include: Feeling sad or worried more often or most of the time. Having less energy and losing interest in pleasurable activities. Changes in your appetite or sleeping patterns. Being easily angered or irritated. Having unexplained aches and pains, digestive problems, or headaches. Follow these instructions at home: Eating and drinking  Eat foods that are high in fiber, such as fresh fruits and vegetables, whole grains, and beans. Limit foods that are high in fat and processed sugars, such as fried or sweet foods. Follow a balanced diet and maintain a healthy weight. Being overweight or underweight can make COPD worse. You may work with a dietitian as part of your pulmonary rehab program. Drink enough fluid to keep your urine pale yellow. If you drink alcohol: Limit how much you have to: 0-1 drink a day for women who are not pregnant. 0-2 drinks a day for men. Know how much alcohol is in your drink. In the [place] one drink equals one 12 oz bottle   of beer (355 mL), one 5 oz glass of wine (148 mL), or one 1 oz glass of hard liquor (44 mL). Lifestyle If you smoke, the most important thing that you can do is to stop smoking. Continuing to smoke will cause the disease to progress faster. Do not use any products that contain nicotine or  tobacco. These products include cigarettes, chewing tobacco, and vaping devices, such as e-cigarettes. If you need help quitting, ask your health care provider. Avoid exposure to things that irritate your lungs, such as smoke, chemicals, and fumes. Activity Balance exercise and rest. Take short walks every 1-2 hours. This is important to improve blood flow and breathing. Ask for help if you feel weak or unsteady. Do exercises that include controlled breathing with body movement, such as tai chi. General instructions Take over-the-counter and prescription medicines only as told by your health care provider. Take vitamin and protein supplements as told by your health care provider or dietitian. Practice good oral hygiene and see your dental care provider regularly. An oral infection can also spread to your lungs. Make sure you receive all the vaccines that your health care provider recommends. Keep all follow-up visits. This is important. Contact a health care provider if you: Are struggling to manage your COPD. Have emotional stress that interferes with your ability to cope with COPD. Get help right away if you: Have thoughts of suicide, death, or hurting yourself or others. If you ever feel like you may hurt yourself or others, or have thoughts about taking your own life, get help right away. Go to your nearest emergency department or: Call your local emergency services (911 in the [place] Call a suicide crisis helpline, such as the National Suicide Prevention Lifeline at 1-800-273-8255 or 988 in the [place] This is open 24 hours a day in the [place] Text the Crisis Text Line at 741741 (in the [place] Summary Being diagnosed with chronic obstructive pulmonary disease (COPD) changes your life physically and emotionally. Work with your health care providers and follow your COPD management plan. A pulmonary rehabilitation program is an important part of COPD management. Prolonged stress, anxiety, and  depression can make COPD worse. Let your health care provider know if emotional stress interferes with your ability to cope with and manage COPD. This information is not intended to replace advice given to you by your health care provider. Make sure you discuss any questions you have with your health care provider. Document Revised: 04/26/2021 Document Reviewed: 10/19/2020 Elsevier Patient Education  2023 Elsevier Inc.  

## 2022-06-02 LAB — HEPATITIS C ANTIBODY: Hep C Virus Ab: NONREACTIVE

## 2022-06-02 LAB — CBC WITH DIFFERENTIAL/PLATELET
Basophils Absolute: 0.1 10*3/uL (ref 0.0–0.2)
Basos: 1 %
EOS (ABSOLUTE): 0.2 10*3/uL (ref 0.0–0.4)
Eos: 2 %
Hematocrit: 36.3 % (ref 34.0–46.6)
Hemoglobin: 11.9 g/dL (ref 11.1–15.9)
Immature Grans (Abs): 0 10*3/uL (ref 0.0–0.1)
Immature Granulocytes: 0 %
Lymphocytes Absolute: 2.2 10*3/uL (ref 0.7–3.1)
Lymphs: 28 %
MCH: 28.9 pg (ref 26.6–33.0)
MCHC: 32.8 g/dL (ref 31.5–35.7)
MCV: 88 fL (ref 79–97)
Monocytes Absolute: 0.6 10*3/uL (ref 0.1–0.9)
Monocytes: 8 %
Neutrophils Absolute: 4.8 10*3/uL (ref 1.4–7.0)
Neutrophils: 61 %
Platelets: 207 10*3/uL (ref 150–450)
RBC: 4.12 x10E6/uL (ref 3.77–5.28)
RDW: 12.7 % (ref 11.7–15.4)
WBC: 7.9 10*3/uL (ref 3.4–10.8)

## 2022-06-02 LAB — COMPREHENSIVE METABOLIC PANEL
ALT: 16 IU/L (ref 0–32)
AST: 18 IU/L (ref 0–40)
Albumin/Globulin Ratio: 1.6 (ref 1.2–2.2)
Albumin: 4.2 g/dL (ref 3.9–4.9)
Alkaline Phosphatase: 89 IU/L (ref 44–121)
BUN/Creatinine Ratio: 9 (ref 9–23)
BUN: 8 mg/dL (ref 6–20)
Bilirubin Total: <0.2 mg/dL (ref 0.0–1.2)
CO2: 22 mmol/L (ref 20–29)
Calcium: 8.9 mg/dL (ref 8.7–10.2)
Chloride: 102 mmol/L (ref 96–106)
Creatinine, Ser: 0.92 mg/dL (ref 0.57–1.00)
Globulin, Total: 2.7 g/dL (ref 1.5–4.5)
Glucose: 83 mg/dL (ref 70–99)
Potassium: 3.9 mmol/L (ref 3.5–5.2)
Sodium: 140 mmol/L (ref 134–144)
Total Protein: 6.9 g/dL (ref 6.0–8.5)
eGFR: 82 mL/min/{1.73_m2} (ref >59)

## 2022-06-02 NOTE — Progress Notes (Signed)
Good morning, please let Lawrence know her labs have returned and are all nice and normal.  No medication changes needed. Keep being amazing!!  Thank you for allowing me to participate in your care.  I appreciate you. Kindest regards, Deaveon Schoen

## 2022-06-21 ENCOUNTER — Ambulatory Visit: Payer: Self-pay | Admitting: *Deleted

## 2022-06-21 ENCOUNTER — Other Ambulatory Visit: Payer: Self-pay | Admitting: Nurse Practitioner

## 2022-06-21 NOTE — Telephone Encounter (Signed)
Requested Prescriptions  Pending Prescriptions Disp Refills  . fluticasone (FLONASE) 50 MCG/ACT nasal spray [Pharmacy Med Name: Fluticasone Propionate 50 MCG/ACT Nasal Suspension] 16 g 0    Sig: Use 2 spray(s) in each nostril once daily     Ear, Nose, and Throat: Nasal Preparations - Corticosteroids Passed - 06/21/2022  9:51 AM      Passed - Valid encounter within last 12 months    Recent Outpatient Visits          2 weeks ago Chronic obstructive pulmonary disease, unspecified COPD type (HCC)   [facility] Cannady, Jolene T, NP   6 months ago Chronic obstructive pulmonary disease, unspecified COPD type (HCC)   [facility] Cannady, Dorie Rank, NP   6 months ago Vaginal bleeding   [facility] [place], Megan P, DO   11 months ago Hidradenitis suppurativa   [facility] [place], Corrie Dandy T, NP   1 year ago Hidradenitis suppurativa   [facility] [place], Dorie Rank, NP      Future Appointments            In 4 months Cannady, Dorie Rank, NP [facility]

## 2022-06-21 NOTE — Telephone Encounter (Signed)
[facility] called in inquiring about her Flonase refill.   She has been at the pharmacy waiting for 2 hours.   Can it be filled?    I went in and gave her a 1 month supply of the Flonase since the refill criteria was met.     Reason for Disposition  Pharmacy with prescription refill question and triager answers question  Answer Assessment - Initial Assessment Questions 1. DRUG NAME: "What medicine do you need to have refilled?"     Flonase 2. REFILLS REMAINING: "How many refills are remaining?" (Note: The label on the medicine or pill bottle will show how many refills are remaining. If there are no refills remaining, then a renewal may be needed.)     Pt at pharmacy at [facility] waiting for refill for 2 hrs.   Any way it can be filled? 3. EXPIRATION DATE: "What is the expiration date?" (Note: The label states when the prescription will expire, and thus can no longer be refilled.)     Not asked 4. PRESCRIBING HCP: "Who prescribed it?" Reason: If prescribed by specialist, call should be referred to that group.     Marnee Guarneri, NP Refill done.   All refill criteria met so I refilled it. 5. SYMPTOMS: "Do you have any symptoms?"     N/A 6. PREGNANCY: "Is there any chance that you are pregnant?" "When was your last menstrual period?"     N/A  Protocols used: Medication Refill and Renewal Call-A-AH

## 2022-07-27 ENCOUNTER — Ambulatory Visit: Payer: Self-pay

## 2022-07-27 ENCOUNTER — Other Ambulatory Visit: Payer: Self-pay | Admitting: Nurse Practitioner

## 2022-07-27 MED ORDER — FLUTICASONE PROPIONATE 50 MCG/ACT NA SUSP: NASAL | 0 refills | Status: DC

## 2022-07-27 NOTE — Telephone Encounter (Signed)
Requested Prescriptions  Pending Prescriptions Disp Refills  . fluticasone (FLONASE) 50 MCG/ACT nasal spray 16 g 0    Sig: Use 2 spray(s) in each nostril once daily     Ear, Nose, and Throat: Nasal Preparations - Corticosteroids Passed - 07/27/2022  4:22 PM      Passed - Valid encounter within last 12 months    Recent Outpatient Visits          1 month ago Chronic obstructive pulmonary disease, unspecified COPD type ([place])   [place], Jolene T, NP   7 months ago Chronic obstructive pulmonary disease, unspecified COPD type ([place])   [place] Cannady, Barbaraann Faster, NP   8 months ago Vaginal bleeding   [place], [place], DO   1 year ago Hidradenitis suppurativa   [place], Barbaraann Faster, NP   1 year ago Hidradenitis suppurativa   [place], Barbaraann Faster, NP      Future Appointments            In 3 months Cannady, Barbaraann Faster, NP [employer], [facility]

## 2022-07-27 NOTE — Telephone Encounter (Signed)
Medication Refill - Medication: fluticasone (FLONASE) 50 MCG/ACT nasal spray  Has the patient contacted their pharmacy? Yes.     Preferred Pharmacy (with phone number or street name):  [facility] [place] - [address] Phone:  [phone]  Fax:  [phone]     Has the patient been seen for an appointment in the last year OR does the patient have an upcoming appointment? Yes.

## 2022-07-27 NOTE — Telephone Encounter (Signed)
  Chief Complaint: medication refill, congestion Symptoms: nasal congestion, cough, SOB at times Frequency: ongoing for 2 years Pertinent Negatives: NA Disposition: [] ED /[] Urgent Care (no appt availability in office) / [] Appointment(In office/virtual)/ []  [facility]/ [] Home Care/ [] Refused Recommended Disposition /[] [facility]/ [x]  Follow-up with PCP Additional Notes: pt was calling to see about refill on flonase, advised pt I refilled flonase. Pt states that she was suppose to see ENT but unable to call them. Advised that Janett Billow tried calling her today and left VM to schedule appt. Tried getting pt in contact with Janett Billow but she wanted appt scheduled for first available and her get a CB with appt date and time. Sent Jessica message via Teams.   Reason for Disposition  [1] Nasal discharge AND [2] present > 10 days  Answer Assessment - Initial Assessment Questions 1. DRUG NAME: "What medicine do you need to have refilled?"     Flonase and montelukast  2. REFILLS REMAINING: "How many refills are remaining?" (Note: The label on the medicine or pill bottle will show how many refills are remaining. If there are no refills remaining, then a renewal may be needed.)     0 4. PRESCRIBING HCP: "Who prescribed it?" Reason: If prescribed by specialist, call should be referred to that group.     Jolene, NP 5. SYMPTOMS: "Do you have any symptoms?"     Nasal congestion  Answer Assessment - Initial Assessment Questions 1. ONSET: "When did the nasal discharge start?"      Ongoing thing 3. COUGH: "Do you have a cough?" If Yes, ask: "Describe the color of your sputum" (clear, white, yellow, green)     Yes  4. RESPIRATORY DISTRESS: "Describe your breathing."      SOB at times  6. SEVERITY: "Overall, how bad are you feeling right now?" (e.g., doesn't interfere with normal activities, staying home from school/work, staying in bed)      Just feeling terrible  7. OTHER SYMPTOMS: "Do  you have any other symptoms?" (e.g., sore throat, earache, wheezing, vomiting)     Nasal congestion, cough  Protocols used: Medication Refill and Renewal Call-A-AH, Common Cold-A-AH

## 2022-09-04 ENCOUNTER — Other Ambulatory Visit: Payer: Self-pay | Admitting: Nurse Practitioner

## 2022-09-04 NOTE — Telephone Encounter (Signed)
Medication Refill - Medication: fluticasone (FLONASE) 50 MCG/ACT nasal spray and clindamycin (CLEOCIN) 75 MG/5ML solution   Has the patient contacted their pharmacy? Yes.    Preferred Pharmacy (with phone number or street name):  [facility] [address] [place]), [place] - [address] Phone: [phone]  Fax: [phone]     Has the patient been seen for an appointment in the last year OR does the patient have an upcoming appointment? Yes.    Agent: Please be advised that RX refills may take up to 3 business days. We ask that you follow-up with your pharmacy.

## 2022-09-04 NOTE — Telephone Encounter (Signed)
clindamycin (CLEOCIN) 75 MG/5ML solution is not on current list, please advise.

## 2022-09-05 MED ORDER — FLUTICASONE PROPIONATE 50 MCG/ACT NA SUSP: NASAL | 0 refills | Status: DC

## 2022-10-10 ENCOUNTER — Other Ambulatory Visit: Payer: Self-pay | Admitting: Nurse Practitioner

## 2022-10-10 NOTE — Telephone Encounter (Signed)
Medication Refill - Medication: fluticasone (FLONASE) 50 MCG/ACT nasal spray   Has the patient contacted their pharmacy? No, patient declined to call pharmacy stating she has done so in the past and eventually had to call PCP because pharmacy never sent in Rx.     Preferred Pharmacy (with phone number or street name):   [facility] [address] [place]), [place] - [address] Phone: [phone]  Fax: [phone]      Has the patient been seen for an appointment in the last year OR does the patient have an upcoming appointment? Yes.    Agent: Please be advised that RX refills may take up to 3 business days. We ask that you follow-up with your pharmacy.

## 2022-10-12 MED ORDER — FLUTICASONE PROPIONATE 50 MCG/ACT NA SUSP: NASAL | 2 refills | Status: DC

## 2022-10-12 NOTE — Telephone Encounter (Signed)
Requested Prescriptions  Pending Prescriptions Disp Refills   fluticasone (FLONASE) 50 MCG/ACT nasal spray 16 g 2    Sig: Use 2 spray(s) in each nostril once daily     Ear, Nose, and Throat: Nasal Preparations - Corticosteroids Passed - 10/10/2022  3:40 PM      Passed - Valid encounter within last 12 months    Recent Outpatient Visits           4 months ago Chronic obstructive pulmonary disease, unspecified COPD type ([facility])   [facility] Cannady, Jolene T, NP   10 months ago Chronic obstructive pulmonary disease, unspecified COPD type ([facility])   [facility] Cannady, Dorie Rank, NP   10 months ago Vaginal bleeding   [facility] [place], Megan P, DO   1 year ago Hidradenitis suppurativa   [facility] [place], Dorie Rank, NP   2 years ago Hidradenitis suppurativa   [facility] [place], Dorie Rank, NP       Future Appointments             In 3 weeks Cannady, Dorie Rank, NP [facility]

## 2022-11-03 NOTE — Patient Instructions (Incomplete)
Living with COPD Being diagnosed with chronic obstructive pulmonary disease (COPD) changes your life physically and emotionally. Having COPD can affect your ability to work and do things you enjoy. COPD is not the same for everyone, and it may change over time. Your health care providers can help you come up with the COPD management plan that works best for you. How to manage lifestyle changes Treatment plan Work closely with your health care providers. Follow your COPD management plan. This plan includes: Instructions about activities, exercises, diet, medicines, what to do when COPD flares up, and when to call your health care provider. A pulmonary rehabilitation program. In pulmonary rehab, you will learn about COPD, do exercises for fitness and breathing, and get support from health care providers and other people who have COPD. Managing emotions and stress Living with a chronic disease means you may also struggle with stressful emotions, such as sadness, fear, and worry. Here are some ways to manage these emotions: Talk to someone about your fear, anxiety, depression, or stress. Learn strategies to avoid or reduce stress and ask for help if you are struggling with depression or anxiety. Consider joining a COPD support group, online or in person.  Adjusting to changes COPD may limit the things you can do, but you can make certain changes to help you cope with the diagnosis. Ask for help when you need it. Getting support from friends, family, and your health care team is an important part of managing the condition. Try to get regular exercise as prescribed by a health care provider or pulmonary rehab team. Exercising can help COPD, even if you are a bit short of breath. Take steps to prevent infection and protect your lungs: Wash your hands often and avoid being in crowds. Stay away from friends and family members who are sick. Check your local air quality each day, and stay out of areas  where air pollution is likely. How to recognize changes in your condition Recognizing changes in your COPD COPD is a progressive disease. It is important to let the health care team know if your COPD is getting worse. Your treatment plan may need to change. Watch for: Increased shortness of breath, wheezing, cough, or fatigue. Loss of ability to exercise or perform daily activities, like climbing stairs. More frequent symptom flares. Signs of depression or anxiety. Recognizing stress It is normal to have additional stress when you have COPD. However, prolonged stress and anxiety can make COPD worse and lead to depression. Recognize the warning signs, which include: Feeling sad or worried more often or most of the time. Having less energy and losing interest in pleasurable activities. Changes in your appetite or sleeping patterns. Being easily angered or irritated. Having unexplained aches and pains, digestive problems, or headaches. Follow these instructions at home: Eating and drinking  Eat foods that are high in fiber, such as fresh fruits and vegetables, whole grains, and beans. Limit foods that are high in fat and processed sugars, such as fried or sweet foods. Follow a balanced diet and maintain a healthy weight. Being overweight or underweight can make COPD worse. You may work with a dietitian as part of your pulmonary rehab program. Drink enough fluid to keep your urine pale yellow. If you drink alcohol: Limit how much you have to: 0-1 drink a day for women who are not pregnant. 0-2 drinks a day for men. Know how much alcohol is in your drink. In the [place] one drink equals one 12 oz bottle   of beer (355 mL), one 5 oz glass of wine (148 mL), or one 1 oz glass of hard liquor (44 mL). Lifestyle If you smoke, the most important thing that you can do is to stop smoking. Continuing to smoke will cause the disease to progress faster. Do not use any products that contain nicotine or  tobacco. These products include cigarettes, chewing tobacco, and vaping devices, such as e-cigarettes. If you need help quitting, ask your health care provider. Avoid exposure to things that irritate your lungs, such as smoke, chemicals, and fumes. Activity Balance exercise and rest. Take short walks every 1-2 hours. This is important to improve blood flow and breathing. Ask for help if you feel weak or unsteady. Do exercises that include controlled breathing with body movement, such as tai chi. General instructions Take over-the-counter and prescription medicines only as told by your health care provider. Take vitamin and protein supplements as told by your health care provider or dietitian. Practice good oral hygiene and see your dental care provider regularly. An oral infection can also spread to your lungs. Make sure you receive all the vaccines that your health care provider recommends. Keep all follow-up visits. This is important. Contact a health care provider if you: Are struggling to manage your COPD. Have emotional stress that interferes with your ability to cope with COPD. Get help right away if you: Have thoughts of suicide, death, or hurting yourself or others. If you ever feel like you may hurt yourself or others, or have thoughts about taking your own life, get help right away. Go to your nearest emergency department or: Call your local emergency services (911 in the [place] Call a suicide crisis helpline, such as the National Suicide Prevention Lifeline at 1-800-273-8255 or 988 in the [place] This is open 24 hours a day in the [place] Text the Crisis Text Line at 741741 (in the [place] Summary Being diagnosed with chronic obstructive pulmonary disease (COPD) changes your life physically and emotionally. Work with your health care providers and follow your COPD management plan. A pulmonary rehabilitation program is an important part of COPD management. Prolonged stress, anxiety, and  depression can make COPD worse. Let your health care provider know if emotional stress interferes with your ability to cope with and manage COPD. This information is not intended to replace advice given to you by your health care provider. Make sure you discuss any questions you have with your health care provider. Document Revised: 04/26/2021 Document Reviewed: 10/19/2020 Elsevier Patient Education  2023 Elsevier Inc.  

## 2022-11-06 ENCOUNTER — Encounter: Payer: Medicaid Other | Admitting: Nurse Practitioner

## 2022-11-09 DIAGNOSIS — J3489 Other specified disorders of nose and nasal sinuses: Secondary | ICD-10-CM | POA: Diagnosis not present

## 2022-11-09 DIAGNOSIS — J310 Chronic rhinitis: Secondary | ICD-10-CM | POA: Diagnosis not present

## 2022-11-10 NOTE — Patient Instructions (Signed)
Eating Plan for Chronic Obstructive Pulmonary Disease Chronic obstructive pulmonary disease (COPD) causes symptoms such as shortness of breath, coughing, and chest discomfort. These symptoms can make it difficult to eat enough to maintain a healthy weight. Generally, people with COPD should eat a diet that is high in calories, protein, and other nutrients to maintain body weight and to keep the lungs as healthy as possible. Depending on the medicines you take and other health conditions you may have, your health care provider may give you additional recommendations on what to eat or avoid. Talk with your health care provider about your goals for body weight, and work with a dietitian to develop an eating plan that is right for you. What are tips for following this plan? Reading food labels  Avoid foods with more than 300 milligrams (mg) of salt (sodium) per serving. Choose foods that contain at least 4 grams (g) of fiber per serving. Try to eat 20-30 g of fiber each day. Choose foods that are high in calories and protein, such as nuts, beans, yogurt, and cheese. Shopping Do not buy foods labeled as diet, low-calorie, or low-fat. If you are able to eat dairy products: Avoid low-fat or skim milk. Buy dairy products that have at least 2% fat. Buy nutritional supplement drinks. Buy grains and prepared foods labeled as enriched or fortified. Consider buying low-sodium, pre-made foods to conserve energy for eating. Cooking Add dry milk or protein powder to smoothies. Cook with healthy fats, such as olive oil, canola oil, sunflower oil, and grapeseed oil. Add oil, butter, cream cheese, or nut butters to foods to increase fat and calories. To make foods easier to chew and swallow: Cook vegetables, pasta, and rice until soft. Cut or grind meat into very small pieces. Dip breads in liquid. Meal planning  Eat when you feel hungry. Eat 5-6 small meals throughout the day. Drink 6-8 glasses of water  each day. Do not drink liquids with meals. Drink liquids at the end of the meal to avoid feeling full too quickly. Eat a variety of fruits and vegetables every day. Ask for assistance from family or friends with planning and preparing meals as needed. Avoid foods that cause you to feel bloated, such as carbonated drinks, fried foods, beans, broccoli, cabbage, and apples. For older adults, ask your local agency on aging whether you are eligible for meal assistance programs, such as Meals on Wheels. Lifestyle  Do not smoke. Eat slowly. Take small bites and chew food well before swallowing. Do not overeat. This may make it more difficult to breathe after eating. Sit up while eating. If needed, continue to use supplemental oxygen while eating. Rest or relax for 30 minutes before and after eating. Monitor your weight as told by your health care provider. Exercise as told by your health care provider. What foods should I eat? Fruits All fresh, dried, canned, or frozen fruits that do not cause gas. Vegetables All fresh, canned (no salt added), or frozen vegetables that do not cause gas. Grains Whole-grain bread. Enriched whole-grain pasta. Fortified whole-grain cereals. Fortified rice. Quinoa. Meats and other proteins Lean meat. Poultry. Fish. Dried beans. Unsalted nuts. Tofu. Eggs. Nut butters. Dairy Whole or 2% milk. Cheese. Yogurt. Fats and oils Olive oil. Canola oil. Butter. Margarine. Beverages Water. Vegetable juice (no salt added). Decaffeinated coffee. Decaffeinated or herbal tea. Seasonings and condiments Fresh or dried herbs. Low-salt or salt-free seasonings. Low-sodium soy sauce. The items listed above may not be a complete list of foods   and beverages you can eat. Contact a dietitian for more information. What foods should I avoid? Fruits Fruits that cause gas, such as apples or melon. Vegetables Vegetables that cause gas, such as broccoli, Brussels sprouts, cabbage,  cauliflower, and onions. Canned vegetables with added salt. Meats and other proteins Fried meat. Salt-cured meat. Processed meat. Dairy Fat-free or low-fat milk, yogurt, or cheese. Processed cheese. Beverages Carbonated drinks. Caffeinated drinks, such as coffee, tea, and soft drinks. Juice. Alcohol. Vegetable juice with added salt. Seasonings and condiments Salt. Seasoning mixes with salt. Soy sauce. Pickles. Other foods Clear soup or broth. Fried foods. Prepared frozen meals. The items listed above may not be a complete list of foods and beverages you should avoid. Contact a dietitian for more information. Summary COPD symptoms can make it difficult to eat enough to maintain a healthy weight. A COPD eating plan can help you maintain your body weight and keep your lungs as healthy as possible. Eat a diet that is high in calories, protein, and other nutrients. Read labels to make sure that you are getting the right nutrients. Cook foods to make them easier to chew and swallow. Eat 5-6 small meals throughout the day, and avoid foods that cause gas or make you feel bloated. This information is not intended to replace advice given to you by your health care provider. Make sure you discuss any questions you have with your health care provider. Document Revised: 08/09/2020 Document Reviewed: 08/09/2020 Elsevier Patient Education  2023 Elsevier Inc.  

## 2022-11-12 ENCOUNTER — Ambulatory Visit (INDEPENDENT_AMBULATORY_CARE_PROVIDER_SITE_OTHER): Payer: Medicaid Other | Admitting: Nurse Practitioner

## 2022-11-12 ENCOUNTER — Encounter: Payer: Self-pay | Admitting: Nurse Practitioner

## 2022-11-12 VITALS — BP 127/84 | HR 94 | Temp 98.3°F | Ht 60.98 in | Wt 198.9 lb

## 2022-11-12 DIAGNOSIS — J449 Chronic obstructive pulmonary disease, unspecified: Secondary | ICD-10-CM | POA: Diagnosis not present

## 2022-11-12 DIAGNOSIS — E559 Vitamin D deficiency, unspecified: Secondary | ICD-10-CM

## 2022-11-12 DIAGNOSIS — E6609 Other obesity due to excess calories: Secondary | ICD-10-CM | POA: Diagnosis not present

## 2022-11-12 DIAGNOSIS — F17210 Nicotine dependence, cigarettes, uncomplicated: Secondary | ICD-10-CM

## 2022-11-12 DIAGNOSIS — Z0000 Encounter for general adult medical examination without abnormal findings: Secondary | ICD-10-CM | POA: Diagnosis not present

## 2022-11-12 DIAGNOSIS — Z136 Encounter for screening for cardiovascular disorders: Secondary | ICD-10-CM

## 2022-11-12 DIAGNOSIS — Z113 Encounter for screening for infections with a predominantly sexual mode of transmission: Secondary | ICD-10-CM

## 2022-11-12 DIAGNOSIS — L732 Hidradenitis suppurativa: Secondary | ICD-10-CM | POA: Diagnosis not present

## 2022-11-12 DIAGNOSIS — Z5900 Homelessness unspecified: Secondary | ICD-10-CM | POA: Diagnosis not present

## 2022-11-12 DIAGNOSIS — J329 Chronic sinusitis, unspecified: Secondary | ICD-10-CM

## 2022-11-12 DIAGNOSIS — Z6835 Body mass index (BMI) 35.0-35.9, adult: Secondary | ICD-10-CM | POA: Diagnosis not present

## 2022-11-12 DIAGNOSIS — Z13220 Encounter for screening for lipoid disorders: Secondary | ICD-10-CM | POA: Diagnosis not present

## 2022-11-12 LAB — WET PREP FOR TRICH, YEAST, CLUE
Clue Cell Exam: NEGATIVE
Trichomonas Exam: NEGATIVE
Yeast Exam: NEGATIVE

## 2022-11-12 MED ORDER — CLINDAMYCIN HCL 300 MG PO CAPS: 300.0000 mg | ORAL_CAPSULE | Freq: Three times a day (TID) | ORAL | 0 refills | Status: AC

## 2022-11-12 NOTE — Assessment & Plan Note (Signed)
I have recommended complete cessation of tobacco use. I have discussed various options available for assistance with tobacco cessation including over the counter methods (Nicotine gum, patch and lozenges). We also discussed prescription options (Chantix, Nicotine Inhaler / Nasal Spray). The patient is not interested in pursuing any prescription tobacco cessation options at this time.  

## 2022-11-12 NOTE — Assessment & Plan Note (Signed)
BMI 37.60.  Recommended eating smaller high protein, low fat meals more frequently and exercising 30 mins a day 5 times a week with a goal of 10-15lb weight loss in the next 3 months. Patient voiced their understanding and motivation to adhere to these recommendations.

## 2022-11-12 NOTE — Assessment & Plan Note (Signed)
Continue to collaborate with specialists as needed -- refill on Clindamycin sent to take for flare as needed.

## 2022-11-12 NOTE — Assessment & Plan Note (Signed)
Chronic.  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  Recommend complete cessation of smoking.  FEV 1112% and FEV1/FVC 106% in 2023. Continue Anoro as is offering benefit and use Proair as needed.  Recommend she wear mask at work all the time to minimize exposure in longs and sinuses.  Labs today.

## 2022-11-12 NOTE — Progress Notes (Signed)
BP 127/84   Pulse 94   Temp 98.3 F (36.8 C) (Oral)   Ht 5' 0.98" (1.549 m)   Wt 198 lb 14.4 oz (90.2 kg)   SpO2 98%   BMI 37.60 kg/m    Subjective:    Patient ID: Samantha Orr, female    DOB: [birth year], 37 y.o.   MRN: [id number]  HPI: Samantha Orr is a 37 y.o. female presenting on 11/12/2022 for comprehensive medical examination. Current medical complaints include:none  She currently lives with: family Menopausal Symptoms: no  Needs refill on Clindamycin 300 MG TID for 7 days due to hidradenitis.  Would like STD screening today on labs.  Depression Screen done today and results listed below:     11/12/2022    3:05 PM 06/01/2022    9:09 AM 11/27/2021    3:03 PM 11/03/2019    1:41 PM 11/03/2019    1:23 PM  Depression screen PHQ 2/9  Decreased Interest 1 0 1 0 0  Down, Depressed, Hopeless 0 0 0 0 0  PHQ - 2 Score 1 0 1 0 0  Altered sleeping 0 0 1 0 0  Tired, decreased energy 0 1 3 0 0  Change in appetite 1 1 1  0 0  Feeling bad or failure about yourself  0 0 0 0 0  Trouble concentrating 0 0 0 0 0  Moving slowly or fidgety/restless 0 0 0 0 0  Suicidal thoughts 0 0 0 0 0  PHQ-9 Score 2 2 6  0 0  Difficult doing work/chores  Very difficult   Not difficult at all       03/02/2022    2:21 PM 05/18/2022    3:17 AM 06/01/2022    9:09 AM 11/12/2022    3:05 PM 11/12/2022    3:25 PM  Fall Risk  Falls in the past year?   0 0 0  Was there an injury with Fall?   0 0 0  Fall Risk Category Calculator   0 0 0  Fall Risk Category (Retired)   Low    (RETIRED) Patient Fall Risk Level Low fall risk Low fall risk Low fall risk    Patient at Risk for Falls Due to   No Fall Risks No Fall Risks No Fall Risks  Fall risk Follow up   Falls evaluation completed Falls evaluation completed Education provided    Functional Status Survey: Is the patient deaf or have difficulty hearing?: No Does the patient have difficulty seeing, even when wearing glasses/contacts?: No Does the patient  have difficulty concentrating, remembering, or making decisions?: No Does the patient have difficulty walking or climbing stairs?: No Does the patient have difficulty dressing or bathing?: No Does the patient have difficulty doing errands alone such as visiting a doctor's office or shopping?: No   Past Medical History:  Past Medical History:  Diagnosis Date   Abscess    Complication of anesthesia    hr/bp dropped after c/s 2015 in [facility]   had spinal   GERD (gastroesophageal reflux disease)    sometimes throws up when gas gets in her chest   Shortness of breath dyspnea     Surgical History:  Past Surgical History:  Procedure Laterality Date   ABCESS DRAINAGE     BUTTOCK   ABDOMINAL SURGERY     tubal, hernia and gallbladder   CESAREAN SECTION     CESAREAN SECTION  2012 and 2015   CHOLECYSTECTOMY     COLPOSCOPY  2015   DILATION AND CURETTAGE OF UTERUS     GALLBLADDER SURGERY  2016   HERNIA REPAIR     HYDRADENITIS EXCISION Bilateral 06/05/2016   Procedure: , right axilla;  Surgeon: Jules Husbands, MD;  Location: [facility] ORS;  Service: General;  Laterality: Bilateral;   HYDRADENITIS EXCISION Left 12/03/2019   Procedure: EXCISION HIDRADENITIS AXILLA Left;  Surgeon: Jules Husbands, MD;  Location: [facility] ORS;  Service: General;  Laterality: Left;   HYDRADENITIS EXCISION Left    INGUINAL HERNIA REPAIR  2016   TUBAL LIGATION     TUBAL LIGATION     TUBAL LIGATION  2016    Medications:  Current Outpatient Medications on File Prior to Visit  Medication Sig   fluticasone (FLONASE) 50 MCG/ACT nasal spray Use 2 spray(s) in each nostril once daily   loratadine (CLARITIN) 10 MG tablet Take 1 tablet (10 mg total) by mouth daily.   montelukast (SINGULAIR) 10 MG tablet Take 1 tablet (10 mg total) by mouth at bedtime.   PROAIR HFA 108 (90 Base) MCG/ACT inhaler INHALE 2 PUFFS BY MOUTH EVERY 6 HOURS AS NEEDED FOR WHEEZING OR SHORTNESS OF BREATH   umeclidinium-vilanterol (ANORO ELLIPTA) 62.5-25  MCG/ACT AEPB Inhale 1 puff into the lungs daily at 6 (six) AM.   No current facility-administered medications on file prior to visit.    Allergies:  Allergies  Allergen Reactions   Bactrim [Sulfamethoxazole-Trimethoprim] Hives    Social History:  Social History   Socioeconomic History   Marital status: Single    Spouse name: Not on file   Number of children: Not on file   Years of education: Not on file   Highest education level: Not on file  Occupational History   Not on file  Tobacco Use   Smoking status: Every Day    Packs/day: 1.00    Types: Cigarettes   Smokeless tobacco: Never  Vaping Use   Vaping Use: Never used  Substance and Sexual Activity   Alcohol use: Yes    Alcohol/week: 6.0 standard drinks of alcohol    Types: 2 Cans of beer, 4 Shots of liquor per week    Comment: weekly   Drug use: No   Sexual activity: Yes    Birth control/protection: Surgical    Comment: s/p tubal ligation  Other Topics Concern   Not on file  Social History Narrative   Not on file   Social Determinants of Health   Financial Resource Strain: Low Risk  (11/12/2022)   Overall Financial Resource Strain (CARDIA)    Difficulty of Paying Living Expenses: Not hard at all  Food Insecurity: No Food Insecurity (11/12/2022)   Hunger Vital Sign    Worried About Running Out of Food in the Last Year: Never true    Ran Out of Food in the Last Year: Never true  Transportation Needs: No Transportation Needs (11/12/2022)   PRAPARE - Hydrologist (Medical): No    Lack of Transportation (Non-Medical): No  Physical Activity: Insufficiently Active (11/12/2022)   Exercise Vital Sign    Days of Exercise per Week: 3 days    Minutes of Exercise per Session: 10 min  Stress: No Stress Concern Present (11/12/2022)   [place]    Feeling of Stress : Only a little  Social Connections: Socially Isolated  (11/12/2022)   Social Connection and Isolation Panel [NHANES]    Frequency of Communication with Friends and Family:  More than three times a week    Frequency of Social Gatherings with Friends and Family: More than three times a week    Attends Religious Services: Never    Database administrator or Organizations: No    Attends Banker Meetings: Never    Marital Status: Never married  Intimate Partner Violence: Not At Risk (11/12/2022)   Humiliation, Afraid, Rape, and Kick questionnaire    Fear of Current or Ex-Partner: No    Emotionally Abused: No    Physically Abused: No    Sexually Abused: No   Social History   Tobacco Use  Smoking Status Every Day   Packs/day: 1.00   Types: Cigarettes  Smokeless Tobacco Never   Social History   Substance and Sexual Activity  Alcohol Use Yes   Alcohol/week: 6.0 standard drinks of alcohol   Types: 2 Cans of beer, 4 Shots of liquor per week   Comment: weekly    Family History:  Family History  Problem Relation Age of Onset   Hypertension Mother    Breast cancer Maternal Aunt    Uterine cancer Maternal Grandmother    Diabetes Maternal Grandfather    Breast cancer Maternal Grandfather        4    Past medical history, surgical history, medications, allergies, family history and social history reviewed with patient today and changes made to appropriate areas of the chart.   ROS All other ROS negative except what is listed above and in the HPI.      Objective:    BP 127/84   Pulse 94   Temp 98.3 F (36.8 C) (Oral)   Ht 5' 0.98" (1.549 m)   Wt 198 lb 14.4 oz (90.2 kg)   SpO2 98%   BMI 37.60 kg/m   Wt Readings from Last 3 Encounters:  11/12/22 198 lb 14.4 oz (90.2 kg)  06/01/22 195 lb 12.8 oz (88.8 kg)  05/18/22 202 lb 1.6 oz (91.7 kg)    Physical Exam Vitals and nursing note reviewed. Exam conducted with a chaperone present.  Constitutional:      General: She is awake. She is not in acute distress.     Appearance: She is well-developed and well-groomed. She is obese. She is not ill-appearing or toxic-appearing.  HENT:     Head: Normocephalic and atraumatic.     Right Ear: Hearing, tympanic membrane, ear canal and external ear normal. No drainage.     Left Ear: Hearing, tympanic membrane, ear canal and external ear normal. No drainage.     Nose: Nose normal.     Right Sinus: No maxillary sinus tenderness or frontal sinus tenderness.     Left Sinus: No maxillary sinus tenderness or frontal sinus tenderness.     Mouth/Throat:     Mouth: Mucous membranes are moist.     Pharynx: Oropharynx is clear. Uvula midline. No pharyngeal swelling, oropharyngeal exudate or posterior oropharyngeal erythema.  Eyes:     General: Lids are normal.        Right eye: No discharge.        Left eye: No discharge.     Extraocular Movements: Extraocular movements intact.     Conjunctiva/sclera: Conjunctivae normal.     Pupils: Pupils are equal, round, and reactive to light.     Visual Fields: Right eye visual fields normal and left eye visual fields normal.  Neck:     Thyroid: No thyromegaly.     Vascular: No carotid bruit.  Trachea: Trachea normal.  Cardiovascular:     Rate and Rhythm: Normal rate and regular rhythm.     Heart sounds: Normal heart sounds. No murmur heard.    No gallop.  Pulmonary:     Effort: Pulmonary effort is normal. No accessory muscle usage or respiratory distress.     Breath sounds: Normal breath sounds.  Chest:  Breasts:    Right: Normal.     Left: Normal.  Abdominal:     General: Bowel sounds are normal.     Palpations: Abdomen is soft. There is no hepatomegaly or splenomegaly.     Tenderness: There is no abdominal tenderness.  Musculoskeletal:        General: Normal range of motion.     Cervical back: Normal range of motion and neck supple.     Right lower leg: No edema.     Left lower leg: No edema.  Lymphadenopathy:     Head:     Right side of head: No submental,  submandibular, tonsillar, preauricular or posterior auricular adenopathy.     Left side of head: No submental, submandibular, tonsillar, preauricular or posterior auricular adenopathy.     Cervical: No cervical adenopathy.     Upper Body:     Right upper body: No supraclavicular, axillary or pectoral adenopathy.     Left upper body: No supraclavicular, axillary or pectoral adenopathy.  Skin:    General: Skin is warm and dry.     Capillary Refill: Capillary refill takes less than 2 seconds.     Findings: No rash.  Neurological:     Mental Status: She is alert and oriented to person, place, and time.     Gait: Gait is intact.     Deep Tendon Reflexes: Reflexes are normal and symmetric.     Reflex Scores:      Brachioradialis reflexes are 2+ on the right side and 2+ on the left side.      Patellar reflexes are 2+ on the right side and 2+ on the left side. Psychiatric:        Attention and Perception: Attention normal.        Mood and Affect: Mood normal.        Speech: Speech normal.        Behavior: Behavior normal. Behavior is cooperative.        Thought Content: Thought content normal.        Judgment: Judgment normal.     Results for orders placed or performed in visit on 06/01/22  CBC with Differential/Platelet  Result Value Ref Range   WBC 7.9 3.4 - 10.8 x10E3/uL   RBC 4.12 3.77 - 5.28 x10E6/uL   Hemoglobin 11.9 11.1 - 15.9 g/dL   Hematocrit 95.1 88.4 - 46.6 %   MCV 88 79 - 97 fL   MCH 28.9 26.6 - 33.0 pg   MCHC 32.8 31.5 - 35.7 g/dL   RDW 16.6 06.3 - 01.6 %   Platelets 207 150 - 450 x10E3/uL   Neutrophils 61 Not Estab. %   Lymphs 28 Not Estab. %   Monocytes 8 Not Estab. %   Eos 2 Not Estab. %   Basos 1 Not Estab. %   Neutrophils Absolute 4.8 1.4 - 7.0 x10E3/uL   Lymphocytes Absolute 2.2 0.7 - 3.1 x10E3/uL   Monocytes Absolute 0.6 0.1 - 0.9 x10E3/uL   EOS (ABSOLUTE) 0.2 0.0 - 0.4 x10E3/uL   Basophils Absolute 0.1 0.0 - 0.2 x10E3/uL   Immature Granulocytes  0 Not  Estab. %   Immature Grans (Abs) 0.0 0.0 - 0.1 x10E3/uL  Comprehensive metabolic panel  Result Value Ref Range   Glucose 83 70 - 99 mg/dL   BUN 8 6 - 20 mg/dL   Creatinine, Ser 1.610.92 0.57 - 1.00 mg/dL   eGFR 82 >09>59 UE/AVW/0.98mL/min/1.73   BUN/Creatinine Ratio 9 9 - 23   Sodium 140 134 - 144 mmol/L   Potassium 3.9 3.5 - 5.2 mmol/L   Chloride 102 96 - 106 mmol/L   CO2 22 20 - 29 mmol/L   Calcium 8.9 8.7 - 10.2 mg/dL   Total Protein 6.9 6.0 - 8.5 g/dL   Albumin 4.2 3.9 - 4.9 g/dL   Globulin, Total 2.7 1.5 - 4.5 g/dL   Albumin/Globulin Ratio 1.6 1.2 - 2.2   Bilirubin Total <0.2 0.0 - 1.2 mg/dL   Alkaline Phosphatase 89 44 - 121 IU/L   AST 18 0 - 40 IU/L   ALT 16 0 - 32 IU/L  Hepatitis C antibody  Result Value Ref Range   Hep C Virus Ab Non Reactive Non Reactive      Assessment & Plan:   Problem List Items Addressed This Visit       Respiratory   COPD (chronic obstructive pulmonary disease) (HCC)    Chronic.  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  Recommend complete cessation of smoking.  FEV 1112% and FEV1/FVC 106% in 2023. Continue Anoro as is offering benefit and use Proair as needed.  Recommend she wear mask at work all the time to minimize exposure in longs and sinuses.  Labs today.      Relevant Orders   CBC with Differential/Platelet     Musculoskeletal and Integument   Hidradenitis suppurativa    Continue to collaborate with specialists as needed -- refill on Clindamycin sent to take for flare as needed.      Relevant Medications   clindamycin (CLEOCIN) 300 MG capsule   Other Relevant Orders   Comprehensive metabolic panel   TSH     Other   Nicotine dependence, cigarettes, uncomplicated    I have recommended complete cessation of tobacco use. I have discussed various options available for assistance with tobacco cessation including over the counter methods (Nicotine gum, patch and lozenges). We also  discussed prescription options (Chantix, Nicotine Inhaler / Nasal Spray). The patient is not interested in pursuing any prescription tobacco cessation options at this time.      Obesity    BMI 37.60.  Recommended eating smaller high protein, low fat meals more frequently and exercising 30 mins a day 5 times a week with a goal of 10-15lb weight loss in the next 3 months. Patient voiced their understanding and motivation to adhere to these recommendations.       Other Visit Diagnoses     Encounter for annual physical exam    -  Primary   Annual physical today with labs and health maintenance reviewed, discussed with patient.   Relevant Orders   TSH   Homelessness       [facility]   Relevant Orders   AMB Referral to Community Care Coordinaton ([facility] Patients)   Screen for STD (sexually transmitted disease)       STD screening on labs today.   Relevant Orders   WET PREP FOR TRICH, YEAST, CLUE   GC/Chlamydia Probe Amp   HIV  Antibody (routine testing w rflx)   HSV 1 and 2 Ab, IgG   RPR   Vitamin D deficiency       History of low levels in past, recheck today and start supplement as needed.   Relevant Orders   VITAMIN D 25 Hydroxy (Vit-D Deficiency, Fractures)   Encounter for lipid screening for cardiovascular disease       Lipid panel on labs today.   Relevant Orders   Comprehensive metabolic panel   Lipid Panel w/o Chol/HDL Ratio        Follow up plan: Return in about 6 months (around 05/13/2023) for COPD, SINUS ISSUES, HIDRADENITIS.   LABORATORY TESTING:  - Pap smear: up to date  IMMUNIZATIONS:   - Tdap: Tetanus vaccination status reviewed: last tetanus booster within 10 years. - Influenza: Refused - Pneumovax: Refused - Prevnar: Not applicable - COVID: Refused - HPV: Not applicable - Shingrix vaccine: Not applicable  SCREENING: -Mammogram: Not applicable  - Colonoscopy: Not applicable  - Bone Density: Not applicable  -Hearing Test: Not  applicable  -Spirometry: Not applicable   PATIENT COUNSELING:   Advised to take 1 mg of folate supplement per day if capable of pregnancy.   Sexuality: Discussed sexually transmitted diseases, partner selection, use of condoms, avoidance of unintended pregnancy  and contraceptive alternatives.   Advised to avoid cigarette smoking.  I discussed with the patient that most people either abstain from alcohol or drink within safe limits (<=14/week and <=4 drinks/occasion for males, <=7/weeks and <= 3 drinks/occasion for females) and that the risk for alcohol disorders and other health effects rises proportionally with the number of drinks per week and how often a drinker exceeds daily limits.  Discussed cessation/primary prevention of drug use and availability of treatment for abuse.   Diet: Encouraged to adjust caloric intake to maintain  or achieve ideal body weight, to reduce intake of dietary saturated fat and total fat, to limit sodium intake by avoiding high sodium foods and not adding table salt, and to maintain adequate dietary potassium and calcium preferably from fresh fruits, vegetables, and low-fat dairy products.    Stressed the importance of regular exercise  Injury prevention: Discussed safety belts, safety helmets, smoke detector, smoking near bedding or upholstery.   Dental health: Discussed importance of regular tooth brushing, flossing, and dental visits.    NEXT PREVENTATIVE PHYSICAL DUE IN 1 YEAR. Return in about 6 months (around 05/13/2023) for COPD, SINUS ISSUES, HIDRADENITIS.

## 2022-11-13 ENCOUNTER — Encounter: Payer: Self-pay | Admitting: Nurse Practitioner

## 2022-11-13 DIAGNOSIS — A6000 Herpesviral infection of urogenital system, unspecified: Secondary | ICD-10-CM | POA: Insufficient documentation

## 2022-11-13 LAB — COMPREHENSIVE METABOLIC PANEL
ALT: 16 IU/L (ref 0–32)
AST: 15 IU/L (ref 0–40)
Albumin/Globulin Ratio: 1.3 (ref 1.2–2.2)
Albumin: 4.1 g/dL (ref 3.9–4.9)
Alkaline Phosphatase: 92 IU/L (ref 44–121)
BUN/Creatinine Ratio: 12 (ref 9–23)
BUN: 8 mg/dL (ref 6–20)
Bilirubin Total: 0.3 mg/dL (ref 0.0–1.2)
CO2: 20 mmol/L (ref 20–29)
Calcium: 9.2 mg/dL (ref 8.7–10.2)
Chloride: 106 mmol/L (ref 96–106)
Creatinine, Ser: 0.65 mg/dL (ref 0.57–1.00)
Globulin, Total: 3.1 g/dL (ref 1.5–4.5)
Glucose: 90 mg/dL (ref 70–99)
Potassium: 4 mmol/L (ref 3.5–5.2)
Sodium: 138 mmol/L (ref 134–144)
Total Protein: 7.2 g/dL (ref 6.0–8.5)
eGFR: 115 mL/min/{1.73_m2} (ref >59)

## 2022-11-13 LAB — HIV ANTIBODY (ROUTINE TESTING W REFLEX): HIV Screen 4th Generation wRfx: NONREACTIVE

## 2022-11-13 LAB — RPR: RPR Ser Ql: NONREACTIVE

## 2022-11-13 LAB — CBC WITH DIFFERENTIAL/PLATELET
Basophils Absolute: 0 10*3/uL (ref 0.0–0.2)
Basos: 0 %
EOS (ABSOLUTE): 0.2 10*3/uL (ref 0.0–0.4)
Eos: 2 %
Hematocrit: 38.5 % (ref 34.0–46.6)
Hemoglobin: 12.6 g/dL (ref 11.1–15.9)
Immature Grans (Abs): 0 10*3/uL (ref 0.0–0.1)
Immature Granulocytes: 0 %
Lymphocytes Absolute: 1.8 10*3/uL (ref 0.7–3.1)
Lymphs: 19 %
MCH: 28.5 pg (ref 26.6–33.0)
MCHC: 32.7 g/dL (ref 31.5–35.7)
MCV: 87 fL (ref 79–97)
Monocytes Absolute: 0.7 10*3/uL (ref 0.1–0.9)
Monocytes: 7 %
Neutrophils Absolute: 6.7 10*3/uL (ref 1.4–7.0)
Neutrophils: 72 %
Platelets: 217 10*3/uL (ref 150–450)
RBC: 4.42 x10E6/uL (ref 3.77–5.28)
RDW: 12.5 % (ref 11.7–15.4)
WBC: 9.4 10*3/uL (ref 3.4–10.8)

## 2022-11-13 LAB — HSV 1 AND 2 AB, IGG
HSV 1 Glycoprotein G Ab, IgG: <0.91 index (ref 0.00–0.90)
HSV 2 IgG, Type Spec: 5.76 index — ABNORMAL HIGH (ref 0.00–0.90)

## 2022-11-13 LAB — LIPID PANEL W/O CHOL/HDL RATIO
Cholesterol, Total: 122 mg/dL (ref 100–199)
HDL: 51 mg/dL (ref >39)
LDL Chol Calc (NIH): 59 mg/dL (ref 0–99)
Triglycerides: 55 mg/dL (ref 0–149)
VLDL Cholesterol Cal: 12 mg/dL (ref 5–40)

## 2022-11-13 LAB — TSH: TSH: 1.27 u[IU]/mL (ref 0.450–4.500)

## 2022-11-13 LAB — VITAMIN D 25 HYDROXY (VIT D DEFICIENCY, FRACTURES): Vit D, 25-Hydroxy: 8.8 ng/mL — ABNORMAL LOW (ref 30.0–100.0)

## 2022-11-13 NOTE — Progress Notes (Signed)
Good morning, please let Pinki know labs have returned and overall these are stable and normal with a couple exceptions: - Vitamin D level is low, I am sending in a weekly higher dose Vitamin D3 supplement for you to start taking weekly to help increase levels.  You level is 8.8 and we want to see above 30. - HIV and syphilis are negative. Waiting on urine testing.  Oral herpes testing is negative, but genital herpes testing returned elevated level.  This means you have had exposure to genital herpes virus in past.  You may have never have had an outbreak and may never have one, but if you do we will treat as needed.  Any questions? Keep being amazing!!  Thank you for allowing me to participate in your care.  I appreciate you. Kindest regards, Loc Feinstein

## 2022-11-14 ENCOUNTER — Other Ambulatory Visit: Payer: Medicaid Other

## 2022-11-14 LAB — GC/CHLAMYDIA PROBE AMP
Chlamydia trachomatis, NAA: NEGATIVE
Neisseria Gonorrhoeae by PCR: NEGATIVE

## 2022-11-14 NOTE — Patient Outreach (Signed)
Medicaid Managed Care Social Work Note  11/14/2022 Name:  Samantha Orr MRN:  [id number] DOB:  [birth year]  Samantha Orr is an 37 y.o. year old female who is a primary patient of Cannady, Barbaraann Faster, NP.  The Medicaid Managed Care Coordination team was consulted for assistance with:  Community Resources   Ms. Vandekamp was given information about Medicaid Managed Care Coordination team services today. Samantha Orr Patient agreed to services and verbal consent obtained.  Engaged with patient  for by telephone forinitial visit in response to referral for case management and/or care coordination services.   Assessments/Interventions:  Review of past medical history, allergies, medications, health status, including review of consultants reports, laboratory and other test data, was performed as part of comprehensive evaluation and provision of chronic care management services.  SDOH: (Social Determinant of Health) assessments and interventions performed: SDOH Interventions    Flowsheet Row Office Visit from 06/01/2022 in [place]  SDOH Interventions   Depression Interventions/Treatment  PHQ2-9 Score <4 Follow-up Not Indicated     BSW completed a telephone outreach with patient. She stated she is in [place] trying to find somewhere for her and children to live. Patient states she would like to buy a trailer for 6500. She was evicted and sued [employer]. Patient states she and her children are also in need of clothes. BSW will send patient some income based housing options for [place] and [employer] as well as some resources for clothing.  Advanced Directives Status:  Not addressed in this encounter.  Care Plan                 Allergies  Allergen Reactions   Bactrim [Sulfamethoxazole-Trimethoprim] Hives    Medications Reviewed Today     Reviewed by Venita Lick, NP (Nurse Practitioner) on 11/12/22 at 1534  Med List  Status: <None>   Medication Order Taking? Sig Documenting Provider Last Dose Status Informant  clindamycin (CLEOCIN) 300 MG capsule [id number] Yes Take 1 capsule (300 mg total) by mouth 3 (three) times daily for 7 days. Marnee Guarneri T, NP  Active   fluticasone (FLONASE) 50 MCG/ACT nasal spray [id number] Yes Use 2 spray(s) in each nostril once daily Cannady, Jolene T, NP Taking Active   loratadine (CLARITIN) 10 MG tablet [id number] Yes Take 1 tablet (10 mg total) by mouth daily. Marnee Guarneri T, NP Taking Active   montelukast (SINGULAIR) 10 MG tablet [id number] Yes Take 1 tablet (10 mg total) by mouth at bedtime. Marnee Guarneri T, NP Taking Active   PROAIR HFA 108 [id number] Base) MCG/ACT inhaler [id number] Yes INHALE 2 PUFFS BY MOUTH EVERY 6 HOURS AS NEEDED FOR WHEEZING OR SHORTNESS OF BREATH Cannady, Jolene T, NP Taking Active   umeclidinium-vilanterol (ANORO ELLIPTA) 62.5-25 MCG/ACT AEPB [id number] Yes Inhale 1 puff into the lungs daily at 6 (six) AM. Venita Lick, NP Taking Active             Patient Active Problem List   Diagnosis Date Noted   Herpes, genital 11/13/2022   Chronic congestion of paranasal sinus 06/01/2022   Left ovarian cyst 02/09/2022   COPD (chronic obstructive pulmonary disease) (HCC) 08/05/2020   Nicotine dependence, cigarettes, uncomplicated 04/07/7627   Obesity 11/03/2019   History of abnormal cervical Pap smear 10/10/2018   Hidradenitis suppurativa     Conditions to be addressed/monitored per PCP order:   Community resources  There are no care plans that you recently modified to display  for this patient.   Follow up:  Patient agrees to Care Plan and Follow-up.  Plan: The Managed Medicaid care management team will reach out to the patient again over the next 30 days.  Date/time of next scheduled Social Work care management/care coordination outreach:  12/13/22  Mickel Fuchs, Arita Miss, [place] Medicaid  Team  [phone]

## 2022-11-14 NOTE — Patient Instructions (Signed)
Visit Information  Ms. Erdmann was given information about Medicaid Managed Care team care coordination services as a part of their Healthy [facility] Medicaid benefit. Eartha Inch verbally consented to engagement with the [facility] Managed Care team.   If you are experiencing a medical emergency, please call 911 or report to your local emergency department or urgent care.   If you have a non-emergency medical problem during routine business hours, please contact your provider's office and ask to speak with a nurse.   For questions related to your Healthy [facility] health plan, please call: [phone] or visit the homepage here: [email or web address]  If you would like to schedule transportation through your Healthy [facility] plan, please call the following number at least 2 days in advance of your appointment: [phone]  For information about your ride after you set it up, call Ride Assist at [phone]. Use this number to activate a Will Call pickup, or if your transportation is late for a scheduled pickup. Use this number, too, if you need to make a change or cancel a previously scheduled reservation.  If you need transportation services right away, call [phone]. The after-hours call center is staffed 24 hours to handle ride assistance and urgent reservation requests (including discharges) 365 days a year. Urgent trips include sick visits, hospital discharge requests and life-sustaining treatment.  Call the [place] at [phone], at any time, 24 hours a day, 7 days a week. If you are in danger or need immediate medical attention call 911.  If you would like help to quit smoking, call 1-800-QUIT-NOW (413)145-7092) OR Espaol: 1-855-Djelo-Ya (8-338-250-5397) o para ms informacin haga clic aqu or Text READY to 200-400 to register via text  Ms. Criscione - following are the goals we discussed in your visit today:   Goals  Addressed   None      Social Worker will follow up on 12/13/22.   Mickel Fuchs, BSW, [place] Managed Medicaid Team  [phone]   Following is a copy of your plan of care:  There are no care plans that you recently modified to display for this patient.

## 2022-11-15 NOTE — Progress Notes (Signed)
Please let Ashly know her urine STD screening returned negative:)

## 2022-12-13 ENCOUNTER — Other Ambulatory Visit: Payer: Medicaid Other

## 2022-12-13 NOTE — Patient Instructions (Signed)
  Medicaid Managed Care   Unsuccessful Outreach Note  12/13/2022 Name: Samantha Orr MRN: [id number] DOB: May 17, 1986  Referred by: Venita Lick, NP Reason for referral : High Risk Managed Medicaid (MM social work unsuccessful telephone outreach )   An unsuccessful telephone outreach was attempted today. The patient was referred to the case management team for assistance with care management and care coordination.   Follow Up Plan: The patient has been provided with contact information for the care management team and has been advised to call with any health related questions or concerns.   Mickel Fuchs, BSW, [place] Managed Medicaid Team  [phone]

## 2022-12-13 NOTE — Patient Outreach (Signed)
  Medicaid Managed Care   Unsuccessful Outreach Note  12/13/2022 Name: Samantha Orr MRN: [id number] DOB: Oct 31, 1985  Referred by: Venita Lick, NP Reason for referral : High Risk Managed Medicaid (MM social work unsuccessful telephone outreach )   An unsuccessful telephone outreach was attempted today. The patient was referred to the case management team for assistance with care management and care coordination.   Follow Up Plan: The patient has been provided with contact information for the care management team and has been advised to call with any health related questions or concerns.   Mickel Fuchs, BSW, [place] Managed Medicaid Team  [phone]

## 2022-12-19 ENCOUNTER — Other Ambulatory Visit: Payer: Self-pay | Admitting: Nurse Practitioner

## 2022-12-19 NOTE — Telephone Encounter (Signed)
Medication Refill - Medication: fluticasone (FLONASE) 50 MCG/ACT nasal spray   Has the patient contacted their pharmacy? yes (Agent: If no, request that the patient contact the pharmacy for the refill. If patient does not wish to contact the pharmacy document the reason why and proceed with request.) (Agent: If yes, when and what did the pharmacy advise?)contact pcp  Preferred Pharmacy (with phone number or street name):  [facility] [place] - [address] Phone: [phone]  Fax: [phone]     Has the patient been seen for an appointment in the last year OR does the patient have an upcoming appointment? yes  Agent: Please be advised that RX refills may take up to 3 business days. We ask that you follow-up with your pharmacy.

## 2022-12-20 MED ORDER — FLUTICASONE PROPIONATE 50 MCG/ACT NA SUSP: NASAL | 5 refills | Status: DC

## 2022-12-20 NOTE — Telephone Encounter (Signed)
Requested Prescriptions  Pending Prescriptions Disp Refills   fluticasone (FLONASE) 50 MCG/ACT nasal spray 16 g 5    Sig: Use 2 spray(s) in each nostril once daily     Ear, Nose, and Throat: Nasal Preparations - Corticosteroids Passed - 12/19/2022  3:59 PM      Passed - Valid encounter within last 12 months    Recent Outpatient Visits           1 month ago Encounter for annual physical exam   [place], Henrine Screws T, NP   6 months ago Chronic obstructive pulmonary disease, unspecified COPD type ([place])   [place], Henrine Screws T, NP   1 year ago Chronic obstructive pulmonary disease, unspecified COPD type ([place])   [place], Barbaraann Faster, NP   1 year ago Vaginal bleeding   [place], [place] P, DO   1 year ago Hidradenitis suppurativa   [place], Barbaraann Faster, NP       Future Appointments             In 4 months Cannady, Barbaraann Faster, NP [place], [facility]

## 2023-03-21 ENCOUNTER — Telehealth: Payer: Self-pay | Admitting: Nurse Practitioner

## 2023-03-21 NOTE — Telephone Encounter (Signed)
[facility] called and spoke to [place], [place] about the refill(s) Flonase requested. Advised it was sent on  #12/20/22 5 refill(s).   Archie Patten stated that she had refills available and is unsure why we received the request. Archie Patten stated she would process the refill request at this time. Archie Patten confirmed the refill was successful and will be ready for the patient in 15 minutes  with a copay of $4.

## 2023-03-21 NOTE — Telephone Encounter (Signed)
Medication Refill - Medication: fluticasone (FLONASE) 50 MCG/ACT nasal spray   Has the patient contacted their pharmacy? No.  Preferred Pharmacy (with phone number or street name):  [facility] [address] [place]),  - [address] Phone: [phone]  Fax: [phone]     Has the patient been seen for an appointment in the last year OR does the patient have an upcoming appointment? Yes.    Agent: Please be advised that RX refills may take up to 3 business days. We ask that you follow-up with your pharmacy.

## 2023-04-16 ENCOUNTER — Other Ambulatory Visit: Payer: Self-pay | Admitting: Nurse Practitioner

## 2023-04-16 NOTE — Telephone Encounter (Signed)
Medication Refill - Medication:  fluticasone (FLONASE) 50 MCG/ACT nasal spray    Has the patient contacted their pharmacy? Yes.    Preferred Pharmacy (with phone number or street name):  [facility] [address]), [place] - [address]) [place] [address] Phone: [phone]  Fax: [phone] DEA #: --  DAW Reason: --       Has the patient been seen for an appointment in the last year OR does the patient have an upcoming appointment? Yes.    Agent: Please be advised that RX refills may take up to 3 business days. We ask that you follow-up with your pharmacy.

## 2023-04-17 MED ORDER — FLUTICASONE PROPIONATE 50 MCG/ACT NA SUSP: NASAL | 0 refills | Status: DC

## 2023-04-17 NOTE — Telephone Encounter (Signed)
Requested Prescriptions  Pending Prescriptions Disp Refills   fluticasone (FLONASE) 50 MCG/ACT nasal spray 16 g 0    Sig: Use 2 spray(s) in each nostril once daily     Ear, Nose, and Throat: Nasal Preparations - Corticosteroids Passed - 04/16/2023  2:00 PM      Passed - Valid encounter within last 12 months    Recent Outpatient Visits           5 months ago Encounter for annual physical exam   [facility] [place], [place] T, NP   10 months ago Chronic obstructive pulmonary disease, unspecified COPD type (HCC)   [place] [facility] [place], Corrie Dandy T, NP   1 year ago Chronic obstructive pulmonary disease, unspecified COPD type (HCC)   [place] [facility] [place], Dorie Rank, NP   1 year ago Vaginal bleeding   [place] [facility] [place], Megan P, DO   1 year ago Hidradenitis suppurativa    [facility] [place], Dorie Rank, NP       Future Appointments             In 3 weeks Cannady, Dorie Rank, NP  [facility], [facility]

## 2023-05-01 ENCOUNTER — Ambulatory Visit: Payer: Self-pay | Admitting: *Deleted

## 2023-05-01 ENCOUNTER — Telehealth: Payer: Self-pay | Admitting: Nurse Practitioner

## 2023-05-01 NOTE — Telephone Encounter (Signed)
Message from [place] T sent at 05/01/2023 12:08 PM EDT  Summary: hidradenitis   Patient called stated her hidradenitis is acting up and she does not know if her environment is what is causing her flare up. She says she also has an abscess. Please f/u with patient          Call History  Contact Date/Time Type Contact Phone/Fax User  05/01/2023 12:03 PM EDT Phone (Incoming) Samantha Orr, Samantha Orr (Self) [phone] Rexene Edison) [place] Jester   Reason for Disposition  Boil > 2 inches across (> 5 cm; larger than a golf ball or ping pong ball)  Answer Assessment - Initial Assessment Questions 1. APPEARANCE of BOIL: "What does the boil look like?"      I have an abscess in my gum/cheek area from the hidradenitis.  I need a refill of clindamycin 150 mg not the 300 mg.   The 300 mg makes me want to sleep.   I have 3 kids so I need to be able to function.  Can Aura Dials, NP call in the Clindamycin 150 mg for me?     There is black mold in my vent.   I'm wondering if this is making my hidradenitis flare up.   I want to talk with Jolene about mold exposure.    2. LOCATION: "Where is the boil located?"      Is there a way to test the area or my blood to see if I have mold intake in me?    For the last 3 months this has been going on.      I have an abscess in top gum or cheek area.   My cheek is a little swollen.     3. NUMBER: "How many boils are there?"      One  4. SIZE: "How big is the boil?" (e.g., inches, cm; compare to size of a coin or other object)     It's in my gum 5. ONSET: "When did the boil start?"     My face is a little swollen.    I need the 150 mg clindamycin not the 300 mg. 6. PAIN: "Is there any pain?" If Yes, ask: "How bad is the pain?"   (Scale 1-10; or mild, moderate, severe)     A little pain 7. FEVER: "Do you have a fever?" If Yes, ask: "What is it, how was it measured, and when did it start?"      No 8. SOURCE: "Have you been around anyone with boils or other Staph  infections?" "Have you ever had boils before?"     I have hidradenitis real bad 9. OTHER SYMPTOMS: "Do you have any other symptoms?" (e.g., shaking chills, weakness, rash elsewhere on body)     Wondering about this exposure to black mold in my house. 10. PREGNANCY: "Is there any chance you are pregnant?" "When was your last menstrual period?"       Not asked  Protocols used: Boil (Skin Abscess)-A-AH

## 2023-05-01 NOTE — Telephone Encounter (Signed)
  Chief Complaint: She has hidradenitis.   Has an abscess in her upper gum/cheek area.   Her cheek in a little swollen Symptoms: She is wondering if the black mold in her vent is causing the hidradenitis flare up.    She wants to be checked for black mold exposure. Frequency: About 3 months  Pertinent Negatives: Patient denies N/A Disposition: [] ED /[] Urgent Care (no appt availability in office) / [x] Appointment(In office/virtual)/ []  [facility]/ [] Home Care/ [] Refused Recommended Disposition /[] [facility]/ []  Follow-up with PCP Additional Notes: Next available appt was July 30.   She already has an appt set up with Aura Dials, NP for that day.    Message added to appt note.     Pt requesting a refill of the Clindamycin 150 mg for the flare up.   She does not want the 300 mg because it makes her very sleepy.   She prefers the 150 mg.

## 2023-05-02 MED ORDER — CLINDAMYCIN HCL 150 MG PO CAPS: 150.0000 mg | ORAL_CAPSULE | Freq: Three times a day (TID) | ORAL | 0 refills | Status: AC

## 2023-05-02 NOTE — Addendum Note (Signed)
Addended by: Aura Dials T on: 05/02/2023 11:54 AM   Modules accepted: Orders

## 2023-05-02 NOTE — Telephone Encounter (Signed)
Patient has not been seen since January, will need a sooner appt if she is not will to wait until her appt on 7/30

## 2023-05-12 DIAGNOSIS — E559 Vitamin D deficiency, unspecified: Secondary | ICD-10-CM | POA: Insufficient documentation

## 2023-05-12 DIAGNOSIS — Z77120 Contact with and (suspected) exposure to mold (toxic): Secondary | ICD-10-CM | POA: Insufficient documentation

## 2023-05-14 ENCOUNTER — Ambulatory Visit: Payer: Medicaid Other | Admitting: Nurse Practitioner

## 2023-05-14 DIAGNOSIS — L732 Hidradenitis suppurativa: Secondary | ICD-10-CM

## 2023-05-14 DIAGNOSIS — E6609 Other obesity due to excess calories: Secondary | ICD-10-CM

## 2023-05-14 DIAGNOSIS — J449 Chronic obstructive pulmonary disease, unspecified: Secondary | ICD-10-CM

## 2023-05-14 DIAGNOSIS — E559 Vitamin D deficiency, unspecified: Secondary | ICD-10-CM

## 2023-05-14 DIAGNOSIS — Z77120 Contact with and (suspected) exposure to mold (toxic): Secondary | ICD-10-CM

## 2023-05-14 DIAGNOSIS — F17210 Nicotine dependence, cigarettes, uncomplicated: Secondary | ICD-10-CM

## 2023-05-15 ENCOUNTER — Ambulatory Visit: Payer: Medicaid Other | Admitting: Nurse Practitioner

## 2023-05-15 ENCOUNTER — Encounter: Payer: Self-pay | Admitting: Nurse Practitioner

## 2023-05-15 VITALS — BP 108/74 | HR 91 | Temp 98.2°F | Ht 60.8 in | Wt 191.8 lb

## 2023-05-15 DIAGNOSIS — L732 Hidradenitis suppurativa: Secondary | ICD-10-CM

## 2023-05-15 DIAGNOSIS — A6004 Herpesviral vulvovaginitis: Secondary | ICD-10-CM | POA: Diagnosis not present

## 2023-05-15 DIAGNOSIS — Z113 Encounter for screening for infections with a predominantly sexual mode of transmission: Secondary | ICD-10-CM | POA: Diagnosis not present

## 2023-05-15 DIAGNOSIS — F17210 Nicotine dependence, cigarettes, uncomplicated: Secondary | ICD-10-CM | POA: Diagnosis not present

## 2023-05-15 DIAGNOSIS — E559 Vitamin D deficiency, unspecified: Secondary | ICD-10-CM | POA: Diagnosis not present

## 2023-05-15 DIAGNOSIS — J449 Chronic obstructive pulmonary disease, unspecified: Secondary | ICD-10-CM

## 2023-05-15 DIAGNOSIS — E6609 Other obesity due to excess calories: Secondary | ICD-10-CM

## 2023-05-15 DIAGNOSIS — Z6835 Body mass index (BMI) 35.0-35.9, adult: Secondary | ICD-10-CM

## 2023-05-15 MED ORDER — MONTELUKAST SODIUM 10 MG PO TABS: 10.0000 mg | ORAL_TABLET | Freq: Every day | ORAL | 4 refills | Status: DC

## 2023-05-15 MED ORDER — FLUTICASONE PROPIONATE 50 MCG/ACT NA SUSP: NASAL | 4 refills | Status: DC

## 2023-05-15 MED ORDER — UMECLIDINIUM-VILANTEROL 62.5-25 MCG/ACT IN AEPB: 1.0000 | INHALATION_SPRAY | Freq: Every day | RESPIRATORY_TRACT | 4 refills | Status: DC

## 2023-05-15 MED ORDER — CLINDAMYCIN HCL 150 MG PO CAPS: 150.0000 mg | ORAL_CAPSULE | Freq: Three times a day (TID) | ORAL | 0 refills | Status: AC

## 2023-05-15 MED ORDER — LORATADINE 10 MG PO TABS: 10.0000 mg | ORAL_TABLET | Freq: Every day | ORAL | 12 refills | Status: DC

## 2023-05-15 MED ORDER — PROAIR HFA 108 (90 BASE) MCG/ACT IN AERS: 2.0000 | INHALATION_SPRAY | Freq: Four times a day (QID) | RESPIRATORY_TRACT | 4 refills | Status: DC | PRN

## 2023-05-15 NOTE — Assessment & Plan Note (Signed)
Ongoing.  Recommend she take supplement as ordered.  Check today.

## 2023-05-15 NOTE — Assessment & Plan Note (Signed)
I have recommended complete cessation of tobacco use. I have discussed various options available for assistance with tobacco cessation including over the counter methods (Nicotine gum, patch and lozenges). We also discussed prescription options (Chantix, Nicotine Inhaler / Nasal Spray). The patient is not interested in pursuing any prescription tobacco cessation options at this time.  

## 2023-05-15 NOTE — Progress Notes (Signed)
BP 108/74   Pulse 91   Temp 98.2 F (36.8 C) (Oral)   Ht 5' 0.8" (1.544 m)   Wt 191 lb 12.8 oz (87 kg)   LMP 05/06/2023 (Exact Date)   SpO2 96%   BMI 36.48 kg/m    Subjective:    Patient ID: Samantha Orr, female    DOB: 11-04-1985, 37 y.o.   MRN: [id number]  HPI: Samantha Orr is a 37 y.o. female  Chief Complaint  Patient presents with   COPD   Hidradenitis    Pt requesting a new prescription for Clindamycin for flare ups   COPD Taking Anoro daily and Proair as needed.  Continues to smoke, one pack lasts 2 days.  She was 16 when started smoking.   COPD status: stable Satisfied with current treatment?: yes Oxygen use: no Dyspnea frequency: occasional Cough frequency: occasional Rescue inhaler frequency: 2 times a week   Limitation of activity: no Productive cough: no Last Spirometry: 06/01/22 Pneumovax: Not up to Date Influenza: Up to Date   HIDRADENITIS Currently having flare to left groin.  Needs refill on Clindamycin.  Does not want to use Humira due to side effects.  Has gone to dermatology in past.  Last saw general surgery in 2022.  Not taking Vitamin D consistently, has history of lows. Duration: chronic Location: various areas History of trauma in area: no Pain: yes Quality: yes Severity: mild Redness: no Swelling: yes Oozing: no Pus: no Fevers: no Nausea/vomiting: no Status: fluctuating Treatments attempted:antibiotics and warm compresses   STD SCREENING Sexual activity:  In a Monogamous Relationship Contraception: no Recent unprotected intercourse: yes History of sexually transmitted diseases: yes Previous sexually transmitted disease screening: yes Lifetime sexual partners:  Genital lesions: no Dysuria: no Swollen lymph nodes: no Fevers: no Rash: no    Relevant past medical, surgical, family and social history reviewed and updated as indicated. Interim medical history since our last visit reviewed. Allergies and medications  reviewed and updated.  Review of Systems  Constitutional:  Negative for activity change, appetite change, diaphoresis, fatigue and fever.  Respiratory:  Positive for cough and wheezing. Negative for chest tightness and shortness of breath.   Cardiovascular:  Negative for chest pain, palpitations and leg swelling.  Gastrointestinal: Negative.   Skin:  Positive for wound.  Neurological: Negative.   Psychiatric/Behavioral: Negative.      Per HPI unless specifically indicated above     Objective:    BP 108/74   Pulse 91   Temp 98.2 F (36.8 C) (Oral)   Ht 5' 0.8" (1.544 m)   Wt 191 lb 12.8 oz (87 kg)   LMP 05/06/2023 (Exact Date)   SpO2 96%   BMI 36.48 kg/m   Wt Readings from Last 3 Encounters:  05/15/23 191 lb 12.8 oz (87 kg)  11/12/22 198 lb 14.4 oz (90.2 kg)  06/01/22 195 lb 12.8 oz (88.8 kg)    Physical Exam Vitals and nursing note reviewed.  Constitutional:      General: She is awake. She is not in acute distress.    Appearance: She is well-developed and well-groomed. She is obese. She is not ill-appearing or toxic-appearing.  HENT:     Head: Normocephalic.     Right Ear: Hearing and external ear normal.     Left Ear: Hearing and external ear normal.  Eyes:     General: Lids are normal.        Right eye: No discharge.  Left eye: No discharge.     Conjunctiva/sclera: Conjunctivae normal.     Pupils: Pupils are equal, round, and reactive to light.  Neck:     Thyroid: No thyromegaly.     Vascular: No carotid bruit.  Cardiovascular:     Rate and Rhythm: Normal rate and regular rhythm.     Heart sounds: Normal heart sounds. No murmur heard.    No gallop.  Pulmonary:     Effort: Pulmonary effort is normal. No accessory muscle usage or respiratory distress.     Breath sounds: Normal breath sounds. No decreased breath sounds, wheezing or rhonchi.  Abdominal:     General: Bowel sounds are normal. There is no distension.     Palpations: Abdomen is soft.      Tenderness: There is no abdominal tenderness.  Musculoskeletal:     Cervical back: Normal range of motion and neck supple.     Right lower leg: No edema.     Left lower leg: No edema.  Lymphadenopathy:     Cervical: No cervical adenopathy.  Skin:    General: Skin is warm and dry.     Comments: Deferred per patient request.  Neurological:     Mental Status: She is alert and oriented to person, place, and time.     Deep Tendon Reflexes: Reflexes are normal and symmetric.     Reflex Scores:      Brachioradialis reflexes are 2+ on the right side and 2+ on the left side.      Patellar reflexes are 2+ on the right side and 2+ on the left side. Psychiatric:        Attention and Perception: Attention normal.        Mood and Affect: Mood normal.        Speech: Speech normal.        Behavior: Behavior normal. Behavior is cooperative.        Thought Content: Thought content normal.    Results for orders placed or performed in visit on 11/12/22  WET PREP FOR TRICH, YEAST, CLUE   Specimen: Sterile Swab   Sterile Swab  Result Value Ref Range   Trichomonas Exam Negative Negative   Yeast Exam Negative Negative   Clue Cell Exam Negative Negative  GC/Chlamydia Probe Amp   Specimen: Urine   UR  Result Value Ref Range   Chlamydia trachomatis, NAA Negative Negative   Neisseria Gonorrhoeae by PCR Negative Negative  CBC with Differential/Platelet  Result Value Ref Range   WBC 9.4 3.4 - 10.8 x10E3/uL   RBC 4.42 3.77 - 5.28 x10E6/uL   Hemoglobin 12.6 11.1 - 15.9 g/dL   Hematocrit 40.9 81.1 - 46.6 %   MCV 87 79 - 97 fL   MCH 28.5 26.6 - 33.0 pg   MCHC 32.7 31.5 - 35.7 g/dL   RDW 91.4 78.2 - 95.6 %   Platelets 217 150 - 450 x10E3/uL   Neutrophils 72 Not Estab. %   Lymphs 19 Not Estab. %   Monocytes 7 Not Estab. %   Eos 2 Not Estab. %   Basos 0 Not Estab. %   Neutrophils Absolute 6.7 1.4 - 7.0 x10E3/uL   Lymphocytes Absolute 1.8 0.7 - 3.1 x10E3/uL   Monocytes Absolute 0.7 0.1 - 0.9  x10E3/uL   EOS (ABSOLUTE) 0.2 0.0 - 0.4 x10E3/uL   Basophils Absolute 0.0 0.0 - 0.2 x10E3/uL   Immature Granulocytes 0 Not Estab. %   Immature Grans (Abs) 0.0 0.0 - 0.1  x10E3/uL  Comprehensive metabolic panel  Result Value Ref Range   Glucose 90 70 - 99 mg/dL   BUN 8 6 - 20 mg/dL   Creatinine, Ser 4.09 0.57 - 1.00 mg/dL   eGFR 811 >91 YN/WGN/5.62   BUN/Creatinine Ratio 12 9 - 23   Sodium 138 134 - 144 mmol/L   Potassium 4.0 3.5 - 5.2 mmol/L   Chloride 106 96 - 106 mmol/L   CO2 20 20 - 29 mmol/L   Calcium 9.2 8.7 - 10.2 mg/dL   Total Protein 7.2 6.0 - 8.5 g/dL   Albumin 4.1 3.9 - 4.9 g/dL   Globulin, Total 3.1 1.5 - 4.5 g/dL   Albumin/Globulin Ratio 1.3 1.2 - 2.2   Bilirubin Total 0.3 0.0 - 1.2 mg/dL   Alkaline Phosphatase 92 44 - 121 IU/L   AST 15 0 - 40 IU/L   ALT 16 0 - 32 IU/L  Lipid Panel w/o Chol/HDL Ratio  Result Value Ref Range   Cholesterol, Total 122 100 - 199 mg/dL   Triglycerides 55 0 - 149 mg/dL   HDL 51 >13 mg/dL   VLDL Cholesterol Cal 12 5 - 40 mg/dL   LDL Chol Calc (NIH) 59 0 - 99 mg/dL  TSH  Result Value Ref Range   TSH 1.270 0.450 - 4.500 uIU/mL  VITAMIN D 25 Hydroxy (Vit-D Deficiency, Fractures)  Result Value Ref Range   Vit D, 25-Hydroxy 8.8 (L) 30.0 - 100.0 ng/mL  HIV Antibody (routine testing w rflx)  Result Value Ref Range   HIV Screen 4th Generation wRfx Non Reactive Non Reactive  HSV 1 and 2 Ab, IgG  Result Value Ref Range   HSV 1 Glycoprotein G Ab, IgG <0.91 0.00 - 0.90 index   HSV 2 IgG, Type Spec 5.76 (H) 0.00 - 0.90 index  RPR  Result Value Ref Range   RPR Ser Ql Non Reactive Non Reactive      Assessment & Plan:   Problem List Items Addressed This Visit       Respiratory   COPD (chronic obstructive pulmonary disease) (HCC) - Primary    Chronic.  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  Recommend complete cessation of smoking.  FEV 1112% and FEV1/FVC 106% in 2023.  Continue Anoro as is offering benefit and use Proair as needed.  Recommend she wear mask at work all the time to minimize exposure in longs and sinuses.  Labs today.      Relevant Medications   fluticasone (FLONASE) 50 MCG/ACT nasal spray   loratadine (CLARITIN) 10 MG tablet   montelukast (SINGULAIR) 10 MG tablet   PROAIR HFA 108 (90 Base) MCG/ACT inhaler   umeclidinium-vilanterol (ANORO ELLIPTA) 62.5-25 MCG/ACT AEPB   Other Relevant Orders   CBC with Differential/Platelet   Comprehensive metabolic panel     Musculoskeletal and Integument   Hidradenitis suppurativa    Continue to collaborate with specialists as needed -- refill on Clindamycin sent to take for flare as needed.      Relevant Medications   clindamycin (CLEOCIN) 150 MG capsule   Other Relevant Orders   CBC with Differential/Platelet     Genitourinary   Herpes, genital    Will recheck levels on labs today.  Educated her on positive labs and what this means.  Never has had an outbreak.      Relevant Medications   clindamycin (CLEOCIN) 150 MG capsule  Other   Nicotine dependence, cigarettes, uncomplicated    I have recommended complete cessation of tobacco use. I have discussed various options available for assistance with tobacco cessation including over the counter methods (Nicotine gum, patch and lozenges). We also discussed prescription options (Chantix, Nicotine Inhaler / Nasal Spray). The patient is not interested in pursuing any prescription tobacco cessation options at this time.      Obesity    BMI 36.48, has lost 7 pounds, praised for this.  Recommended eating smaller high protein, low fat meals more frequently and exercising 30 mins a day 5 times a week with a goal of 10-15lb weight loss in the next 3 months. Patient voiced their understanding and motivation to adhere to these recommendations.       Vitamin D deficiency    Ongoing.  Recommend she take supplement as ordered.  Check today.       Relevant Orders   VITAMIN D 25 Hydroxy (Vit-D Deficiency, Fractures)   Other Visit Diagnoses     Screen for STD (sexually transmitted disease)       STD screening on labs today per request.   Relevant Orders   GC/Chlamydia Probe Amp   HIV Antibody (routine testing w rflx)   HSV 1 and 2 Ab, IgG   RPR        Follow up plan: Return in about 6 months (around 11/15/2023) for Annual physical with spirometry after 11/11/23.

## 2023-05-15 NOTE — Assessment & Plan Note (Signed)
Chronic.  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  Recommend complete cessation of smoking.  FEV 1112% and FEV1/FVC 106% in 2023. Continue Anoro as is offering benefit and use Proair as needed.  Recommend she wear mask at work all the time to minimize exposure in longs and sinuses.  Labs today.

## 2023-05-15 NOTE — Assessment & Plan Note (Signed)
BMI 36.48, has lost 7 pounds, praised for this.  Recommended eating smaller high protein, low fat meals more frequently and exercising 30 mins a day 5 times a week with a goal of 10-15lb weight loss in the next 3 months. Patient voiced their understanding and motivation to adhere to these recommendations.

## 2023-05-15 NOTE — Patient Instructions (Signed)
Hidradenitis Suppurativa Hidradenitis suppurativa is a long-term (chronic) skin disease. It is similar to a severe form of acne, but it affects areas of the body where acne would be unusual, especially areas of the body where skin rubs against skin and becomes moist. These include: Underarms. Groin. Genital area. Buttocks. Upper thighs. Breasts. Hidradenitis suppurativa may start out as small lumps or pimples caused by blocked skin pores, sweat glands, or hair follicles. Pimples may develop into deep sores that break open (rupture) and drain pus. Over time, affected areas of skin may thicken and become scarred. This condition is rare and does not spread from person to person (non-contagious). What are the causes? The exact cause of this condition is not known. It may be related to: Female and female hormones. An overactive disease-fighting system (immune system). The immune system may over-react to blocked hair follicles or sweat glands and cause swelling and pus-filled sores. What increases the risk? You are more likely to develop this condition if you: Are female. Are 11-55 years old. Have a family history of hidradenitis suppurativa. Have a personal history of acne. Are overweight. Smoke. Take the medicine lithium. What are the signs or symptoms? The first symptoms are usually painful bumps in the skin, similar to pimples. The condition may get worse over time (progress), or it may only cause mild symptoms. If the disease progresses, symptoms may include: Skin bumps getting bigger and growing deeper into the skin. Bumps rupturing and draining pus. Itchy, infected skin. Skin getting thicker and scarred. Tunnels under the skin (fistulas) where pus drains from a bump. Pain during daily activities, such as pain during walking if your groin area is affected. Emotional problems, such as stress or depression. This condition may affect your appearance and your ability or willingness to wear  certain clothes or do certain activities. How is this diagnosed? This condition is diagnosed by a health care provider who specializes in skin conditions (dermatologist). You may be diagnosed based on: Your symptoms and medical history. A physical exam. Testing a pus sample for infection. Blood tests. How is this treated? Your treatment will depend on how severe your symptoms are. The same treatment will not work for everybody with this condition. You may need to try several treatments to find what works best for you. Treatment may include: Cleaning and bandaging (dressing) your wounds as needed. Lifestyle changes, such as new skin care routines. Taking medicines, such as: Antibiotics. Acne medicines. Medicines to reduce the activity of the immune system. A diabetes medicine (metformin). Birth control pills, for women. Steroids to reduce swelling and pain. Working with a mental health care provider, if you experience emotional distress due to this condition. If you have severe symptoms that do not get better with medicine, you may need surgery. Surgery may involve: Using a laser to clear the skin and remove hair follicles. Opening and draining deep sores. Removing the areas of skin that are diseased and scarred. Follow these instructions at home: Medicines  Take over-the-counter and prescription medicines only as told by your health care provider. If you were prescribed antibiotics, take them as told by your health care provider. Do not stop using the antibiotic even if your condition improves. Skin care If you have open wounds, cover them with a clean dressing as told by your health care provider. Keep wounds clean by washing them gently with soap and water when you bathe. Do not shave the areas where you get hidradenitis suppurativa. Wear loose-fitting clothes. Try to avoid   getting overheated or sweaty. If you get sweaty or wet, change into clean, dry clothes as soon as you can. To  help relieve pain and itchiness, cover sore areas with a warm, clean washcloth (warm compress) for 5-10 minutes as often as needed. Your healthcare provider may recommend an antiperspirant deodorant that may be gentle on your skin. A daily antiseptic wash to cleanse affected areas may be suggested by your healthcare provider. General instructions Learn as much as you can about your disease so that you have an active role in your treatment. Work closely with your health care provider to find treatments that work for you. If you are overweight, work with your health care provider to lose weight as recommended. Do not use any products that contain nicotine or tobacco. These products include cigarettes, chewing tobacco, and vaping devices, such as e-cigarettes. If you need help quitting, ask your health care provider. If you struggle with living with this condition, talk with your health care provider or work with a mental health care provider as recommended. Keep all follow-up visits. Where to find more information Hidradenitis Suppurativa Foundation, Inc.: www.hs-foundation.org American Academy of Dermatology: www.aad.org Contact a health care provider if: You have a flare-up of hidradenitis suppurativa. You have a fever or chills. You have trouble controlling your symptoms at home. You have trouble doing your daily activities because of your symptoms. You have trouble dealing with emotional problems related to your condition. Summary Hidradenitis suppurativa is a long-term (chronic) skin disease. It is similar to a severe form of acne, but it affects areas of the body where acne would be unusual. The first symptoms are usually painful bumps in the skin, similar to pimples. The condition may only cause mild symptoms, or it may get worse over time (progress). If you have open wounds, cover them with a clean dressing as told by your health care provider. Keep wounds clean by washing them gently with  soap and water when you bathe. Besides skin care, treatment may include medicines, laser treatment, and surgery. This information is not intended to replace advice given to you by your health care provider. Make sure you discuss any questions you have with your health care provider. Document Revised: 11/22/2021 Document Reviewed: 11/22/2021 Elsevier Patient Education  2024 Elsevier Inc.  

## 2023-05-15 NOTE — Assessment & Plan Note (Signed)
Continue to collaborate with specialists as needed -- refill on Clindamycin sent to take for flare as needed.

## 2023-05-15 NOTE — Assessment & Plan Note (Addendum)
Will recheck levels on labs today.  Educated her on positive labs and what this means.  Never has had an outbreak.

## 2023-05-16 ENCOUNTER — Other Ambulatory Visit: Payer: Self-pay | Admitting: Nurse Practitioner

## 2023-05-16 MED ORDER — CHOLECALCIFEROL 1.25 MG (50000 UT) PO TABS
1.0000 | ORAL_TABLET | ORAL | 4 refills | Status: DC
Start: 2023-05-16 — End: 2023-07-04

## 2023-05-16 NOTE — Progress Notes (Signed)
Good morning, please let Dayan know her labs have returned: - Vitamin D is low, please ensure you take the weekly Vitamin D supplement I sent in. - Your herpes testing continues to show negative for oral herpes and positive for genital, level is a little lower then last one but still elevated.  Remember what we discussed about this:) - Remainder of labs are all stable.  Any questions? Keep being amazing!!  Thank you for allowing me to participate in your care.  I appreciate you. Kindest regards, Shahed Yeoman

## 2023-05-17 NOTE — Telephone Encounter (Signed)
Requested medication (s) are due for refill today: alternative requested  Requested medication (s) are on the active medication list: yes  Last refill:  05/15/23  Future visit scheduled: yes  Notes to clinic:  Pharmacy comment: Proair brand is no longer available. Insurance prefers Ventolin brand.      Requested Prescriptions  Pending Prescriptions Disp Refills   albuterol (VENTOLIN HFA) 108 (90 Base) MCG/ACT inhaler [Pharmacy Med Name: ALBUTEROL HFA (9GM) AER] 18 g 4    Sig: INHALE 2 PUFFS BY MOUTH EVERY 6 HOURS AS NEEDED FOR WHEEZING OR SHORTNESS OF BREATH     Pulmonology:  Beta Agonists 2 Passed - 05/16/2023  3:29 PM      Passed - Last BP in normal range    BP Readings from Last 1 Encounters:  05/15/23 108/74         Passed - Last Heart Rate in normal range    Pulse Readings from Last 1 Encounters:  05/15/23 91         Passed - Valid encounter within last 12 months    Recent Outpatient Visits           2 days ago Chronic obstructive pulmonary disease, unspecified COPD type (HCC)   [place] [facility] H[place], Corrie Dandy T, NP   6 months ago Encounter for annual physical exam   [place] [facility] [place], Hendricks T, NP   11 months ago Chronic obstructive pulmonary disease, unspecified COPD type (HCC)   [place] [facility] [place], Corrie Dandy T, NP   1 year ago Chronic obstructive pulmonary disease, unspecified COPD type (HCC)   [place] [facility] [place], Corrie Dandy T, NP   1 year ago Vaginal bleeding   [place] [facility] Ajo, Megan P, DO       Future Appointments             In 6 months Cannady, Dorie Rank, NP [place] [facility]

## 2023-05-20 NOTE — Progress Notes (Signed)
Contacted via MyChart   Urine testing is negative:)

## 2023-05-28 ENCOUNTER — Emergency Department
Admission: EM | Admit: 2023-05-28 | Discharge: 2023-05-28 | Disposition: A | Discharge status: Home / Self Care | Payer: Medicaid Other | Attending: Emergency Medicine | Admitting: Emergency Medicine

## 2023-05-28 ENCOUNTER — Other Ambulatory Visit: Payer: Self-pay

## 2023-05-28 ENCOUNTER — Encounter: Payer: Self-pay | Admitting: Emergency Medicine

## 2023-05-28 DIAGNOSIS — L0291 Cutaneous abscess, unspecified: Secondary | ICD-10-CM

## 2023-05-28 DIAGNOSIS — N764 Abscess of vulva: Secondary | ICD-10-CM | POA: Insufficient documentation

## 2023-05-28 DIAGNOSIS — J449 Chronic obstructive pulmonary disease, unspecified: Secondary | ICD-10-CM | POA: Diagnosis not present

## 2023-05-28 MED ORDER — LIDOCAINE HCL (PF) 1 % IJ SOLN
5.0000 mL | Freq: Once | INTRAMUSCULAR | Status: AC
  Administered 2023-05-28: 5 mL via INTRADERMAL
  Filled 2023-05-28: qty 5

## 2023-05-28 MED ORDER — OXYCODONE-ACETAMINOPHEN 5-325 MG PO TABS: 1.0000 | ORAL_TABLET | ORAL | 0 refills | Status: AC | PRN

## 2023-05-28 MED ORDER — NAPROXEN 500 MG PO TABS
500.0000 mg | ORAL_TABLET | Freq: Once | ORAL | Status: AC
  Administered 2023-05-28: 500 mg via ORAL
  Filled 2023-05-28: qty 1

## 2023-05-28 NOTE — Discharge Instructions (Signed)
You can remove the packing yourself in 2 days or return emergency department.  Follow-up with Dr. Everlene Farrier.  Return if worsening

## 2023-05-28 NOTE — ED Triage Notes (Signed)
Patient to ED via POV for abscess to groin area. States has hx of same. Ongoing x3 days- no drainage at this time.

## 2023-05-28 NOTE — ED Provider Notes (Signed)
[facility] Provider Note    Event Date/Time   First MD Initiated Contact with Patient 05/28/23 0930     (approximate)   History   Abscess   HPI  Samantha Orr is a 37 y.o. female with history of hidradenitis and COPD presents emergency department with a abscess to the right groin area.  Patient states that it is due to her hidradenitis.  States she is already on clindamycin but the areas got worse.  States usually they have to drain it when it is like this.  Denies fever or chills.  Patient did drive herself here.      Physical Exam   Triage Vital Signs: ED Triage Vitals  Encounter Vitals Group     BP 05/28/23 0907 138/89     Systolic BP Percentile --      Diastolic BP Percentile --      Pulse Rate 05/28/23 0907 (!) 111     Resp 05/28/23 0907 18     Temp 05/28/23 0907 98.7 F (37.1 C)     Temp Source 05/28/23 0907 Oral     SpO2 05/28/23 0907 96 %     Weight 05/28/23 0906 192 lb (87.1 kg)     Height 05/28/23 0906 5\' 1"  (1.549 m)     Head Circumference --      Peak Flow --      Pain Score 05/28/23 0906 10     Pain Loc --      Pain Education --      Exclude from Growth Chart --     Most recent vital signs: Vitals:   05/28/23 0907  BP: 138/89  Pulse: (!) 111  Resp: 18  Temp: 98.7 F (37.1 C)  SpO2: 96%     General: Awake, no distress.   CV:  Good peripheral perfusion. regular rate and  rhythm Resp:  Normal effort.  Abd:  No distention.   Other:  Skin was swollen area at the pubis to the right groin area, very tender to palpation   ED Results / Procedures / Treatments   Labs (all labs ordered are listed, but only abnormal results are displayed) Labs Reviewed - No data to display   EKG     RADIOLOGY     PROCEDURES:   .Marland KitchenIncision and Drainage  Date/Time: 05/28/2023 11:44 AM  Performed by: Faythe Ghee, PA-C Authorized by: Faythe Ghee, PA-C   Consent:    Consent obtained:  Verbal   Consent given by:   Patient   Risks, benefits, and alternatives were discussed: yes     Risks discussed:  Bleeding, incomplete drainage, pain, damage to other organs and infection   Alternatives discussed:  Delayed treatment Universal protocol:    Procedure explained and questions answered to patient or proxy's satisfaction: yes     Immediately prior to procedure, a time out was called: yes     Patient identity confirmed:  Verbally with patient Location:    Type:  Abscess   Location:  Anogenital   Anogenital location:  V[place]va Pre-procedure details:    Skin preparation:  Povidone-iodine Anesthesia:    Anesthesia method:  Local infiltration   Local anesthetic:  Lidocaine 1% w/o epi Procedure type:    Complexity:  Complex Procedure details:    Incision types:  Stab incision   Incision depth:  Dermal   Wound management:  Irrigated with saline and probed and deloculated   Drainage:  Bloody and purulent  Drainage amount:  Copious   Wound treatment:  Drain placed   Packing materials:  1/4 in iodoform gauze Post-procedure details:    Procedure completion:  Tolerated well, no immediate complications    MEDICATIONS ORDERED IN ED: Medications  lidocaine (PF) (XYLOCAINE) 1 % injection 5 mL (5 mLs Intradermal Given 05/28/23 1006)  naproxen (NAPROSYN) tablet 500 mg (500 mg Oral Given 05/28/23 1006)     IMPRESSION / MDM / ASSESSMENT AND PLAN / ED COURSE  I reviewed the triage vital signs and the nursing notes.                              Differential diagnosis includes, but is not limited to, abscess, hidradenitis, cellulitis  Patient's presentation is most consistent with acute illness / injury with system symptoms.   Patient's exam is consistent with an abscess.  She is currently on clindamycin and has about 1 week left of medication.  See procedure note for incision and drainage.  Patient was given pain medication here in the ED, naproxen.  She was given a prescription for Percocet 5/325.  She is to  continue her clindamycin.  Follow-up with Dr. Everlene Farrier  who is her surgeon that she sees regularly.  Return emergency department worsening.  Patient is well acquainted with removing and repacking wounds.  Explained to her she can remove the packing in 2 days and replace the packing if needed.  She is in agreement treatment plan.  Discharged in stable condition.      FINAL CLINICAL IMPRESSION(S) / ED DIAGNOSES   Final diagnoses:  Abscess     Rx / DC Orders   ED Discharge Orders          Ordered    oxyCODONE-acetaminophen (PERCOCET) 5-325 MG tablet  Every 4 hours PRN        05/28/23 1037             Note:  This document was prepared using Dragon voice recognition software and may include unintentional dictation errors.    Faythe Ghee, PA-C 05/28/23 1146    Jene Every, MD 05/28/23 [phone]

## 2023-05-28 NOTE — ED Notes (Signed)
See triage note  Presents with possible abscess area to groin area  Noticed some swelling and tenderness to area about 3 days ago

## 2023-06-03 ENCOUNTER — Ambulatory Visit: Payer: Medicaid Other | Admitting: Surgery

## 2023-06-10 ENCOUNTER — Ambulatory Visit: Payer: Medicaid Other | Admitting: Surgery

## 2023-07-03 ENCOUNTER — Other Ambulatory Visit: Payer: Self-pay | Admitting: Nurse Practitioner

## 2023-07-03 NOTE — Telephone Encounter (Unsigned)
Copied from CRM [id number]. Topic: General - Other >> Jul 03, 2023 11:07 AM Franchot Heidelberg wrote: Reason for CRM: Pt has questions about recent testing and wants to speak to a nurse, please advise. Also wants to speak to PCP, she declined scheduling an appt.  Best contact: [phone]

## 2023-07-04 MED ORDER — CHOLECALCIFEROL 1.25 MG (50000 UT) PO TABS: 1.0000 | ORAL_TABLET | ORAL | 4 refills | Status: AC

## 2023-07-04 MED ORDER — FLUTICASONE PROPIONATE 50 MCG/ACT NA SUSP: NASAL | 4 refills | Status: DC

## 2023-07-04 MED ORDER — LORATADINE 10 MG PO TABS: 10.0000 mg | ORAL_TABLET | Freq: Every day | ORAL | 12 refills | Status: AC

## 2023-07-04 MED ORDER — MONTELUKAST SODIUM 10 MG PO TABS: 10.0000 mg | ORAL_TABLET | Freq: Every day | ORAL | 4 refills | Status: AC

## 2023-07-04 MED ORDER — ALBUTEROL SULFATE HFA 108 (90 BASE) MCG/ACT IN AERS: 2.0000 | INHALATION_SPRAY | Freq: Four times a day (QID) | RESPIRATORY_TRACT | 4 refills | Status: DC | PRN

## 2023-07-04 MED ORDER — UMECLIDINIUM-VILANTEROL 62.5-25 MCG/ACT IN AEPB: 1.0000 | INHALATION_SPRAY | Freq: Every day | RESPIRATORY_TRACT | 4 refills | Status: DC

## 2023-07-04 NOTE — Telephone Encounter (Signed)
Called and spoke with patient. She states that she needs her medications sent to a different pharmacy. New pharmacy added to chart and medications t'd up.   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 Patient states she has questions and wished to speak to [facility] regarding this. States she had a discussion with Jolene while in the room at her last visit and has questions directly for her. I told the patient that Jolene was off this afternoon and would return tomorrow.

## 2023-11-18 ENCOUNTER — Encounter: Payer: Self-pay | Admitting: Nurse Practitioner

## 2023-11-25 ENCOUNTER — Encounter: Payer: Medicaid Other | Admitting: Nurse Practitioner

## 2023-11-25 DIAGNOSIS — J329 Chronic sinusitis, unspecified: Secondary | ICD-10-CM

## 2023-11-25 DIAGNOSIS — Z0000 Encounter for general adult medical examination without abnormal findings: Secondary | ICD-10-CM

## 2023-11-25 DIAGNOSIS — L732 Hidradenitis suppurativa: Secondary | ICD-10-CM

## 2023-11-25 DIAGNOSIS — Z13220 Encounter for screening for lipoid disorders: Secondary | ICD-10-CM

## 2023-11-25 DIAGNOSIS — F17210 Nicotine dependence, cigarettes, uncomplicated: Secondary | ICD-10-CM

## 2023-11-25 DIAGNOSIS — J449 Chronic obstructive pulmonary disease, unspecified: Secondary | ICD-10-CM

## 2023-11-25 DIAGNOSIS — E559 Vitamin D deficiency, unspecified: Secondary | ICD-10-CM

## 2024-02-07 ENCOUNTER — Other Ambulatory Visit: Payer: Self-pay | Admitting: Nurse Practitioner

## 2024-02-10 NOTE — Telephone Encounter (Signed)
 Over due 6 month recheck/AEX- courtesy refill given Requested Prescriptions  Pending Prescriptions Disp Refills   fluticasone  (FLONASE ) 50 MCG/ACT nasal spray [Pharmacy Med Name: FLUTICASONE  NASAL SP (120) RX] 16 g 0    Sig: SHAKE LIQUID AND USE 2 SPRAYS IN EACH NOSTRIL DAILY     Ear, Nose, and Throat: Nasal Preparations - Corticosteroids Failed - 02/10/2024  9:56 AM      Failed - Valid encounter within last 12 months    Recent Outpatient Visits   None

## 2024-03-12 ENCOUNTER — Other Ambulatory Visit: Payer: Self-pay | Admitting: Nurse Practitioner

## 2024-03-13 NOTE — Telephone Encounter (Signed)
 Requested medication (s) are due for refill today: Yes  Requested medication (s) are on the active medication list: Yes  Last refill:  02/10/24  Future visit scheduled: No  Notes to clinic:  Unable to refill per protocol, appointment needed.      Requested Prescriptions  Pending Prescriptions Disp Refills   fluticasone  (FLONASE ) 50 MCG/ACT nasal spray [Pharmacy Med Name: FLUTICASONE  NASAL SP (120) RX] 48 g     Sig: SHAKE LIQUID AND USE 2 SPRAYS IN EACH NOSTRIL DAILY     Ear, Nose, and Throat: Nasal Preparations - Corticosteroids Failed - 03/13/2024  9:49 PM      Failed - Valid encounter within last 12 months    Recent Outpatient Visits   None

## 2024-05-07 ENCOUNTER — Other Ambulatory Visit: Payer: Self-pay | Admitting: Nurse Practitioner

## 2024-05-07 ENCOUNTER — Ambulatory Visit: Payer: Self-pay

## 2024-05-07 MED ORDER — UMECLIDINIUM-VILANTEROL 62.5-25 MCG/ACT IN AEPB: 1.0000 | INHALATION_SPRAY | Freq: Every day | RESPIRATORY_TRACT | 4 refills | Status: DC

## 2024-05-07 MED ORDER — ALBUTEROL SULFATE HFA 108 (90 BASE) MCG/ACT IN AERS: 2.0000 | INHALATION_SPRAY | Freq: Four times a day (QID) | RESPIRATORY_TRACT | 4 refills | Status: DC | PRN

## 2024-05-07 NOTE — Telephone Encounter (Signed)
 Requested medications are due for refill today.  yes  Requested medications are on the active medications list.  yes  Last refill. Albuterol  07/04/2023 18 4 rf,  Anoro 07/04/2023 60 4 rf  Future visit scheduled.   no  Notes to clinic.  Pt last seen 05/15/2023. Pt called in for triage with SOB. Advised UC/Ed. Pt is requesting a refill of medications and will go to UC this evening or call EMS if needed.    Requested Prescriptions  Pending Prescriptions Disp Refills   albuterol  (VENTOLIN  HFA) 108 (90 Base) MCG/ACT inhaler 18 g 4    Sig: Inhale 2 puffs into the lungs every 6 (six) hours as needed for wheezing or shortness of breath.     Pulmonology:  Beta Agonists 2 Failed - 05/07/2024 12:11 PM      Failed - Last Heart Rate in normal range    Pulse Readings from Last 1 Encounters:  05/28/23 (!) 111         Failed - Valid encounter within last 12 months    Recent Outpatient Visits   None            Passed - Last BP in normal range    BP Readings from Last 1 Encounters:  05/28/23 138/89          umeclidinium-vilanterol (ANORO ELLIPTA ) 62.5-25 MCG/ACT AEPB 60 each 4    Sig: Inhale 1 puff into the lungs daily at 6 (six) AM.     Pulmonology:  Combination Products Failed - 05/07/2024 12:11 PM      Failed - Valid encounter within last 12 months    Recent Outpatient Visits   None

## 2024-05-07 NOTE — Telephone Encounter (Unsigned)
 Copied from CRM [id number]. Topic: Clinical - Medication Refill >> May 07, 2024 11:53 AM Charlet HERO wrote: Medication: albuterol  (VENTOLIN  HFA) 108 (90 Base) MCG/ACT inhale fluticasone  (FLONASE ) 50 MCG/ACT nasal spray umeclidinium-vilanterol (ANORO ELLIPTA ) 62.5-25 MCG/ACT AEPB   Has the patient contacted their pharmacy? No No refill  This is the patient's preferred pharmacy:    [id number] - [place], [place] - [address] AT [facility] [address] [place] [address] Phone: [phone] Fax: [phone]  Is this the correct pharmacy for this prescription? Yes If no, delete pharmacy and type the correct one.   Has the prescription been filled recently? Yes  Is the patient out of the medication? Yes  Has the patient been seen for an appointment in the last year OR does the patient have an upcoming appointment? Yes  Can we respond through MyChart? Yes  Agent: Please be advised that Rx refills may take up to 3 business days. We ask that you follow-up with your pharmacy.

## 2024-05-07 NOTE — Telephone Encounter (Signed)
 FYI Only or Action Required?: Action required by provider: refill of inhaler.  Patient was last seen in primary care on 05/15/2023 by Samantha Melanie DASEN, NP.  Called Nurse Triage reporting Shortness of Breath. - sinus congestion  Symptoms began several days ago.  Interventions attempted: Nothing.  Symptoms are: unchanged.  Triage Disposition: Go to ED Now (Notify PCP) Pt is experiencing SOB along with sinus congestion. Pt is out of town. No appts in office. Pt just wants her inhaler refilled advise UC. Pt may go this evening. Pt may call EMS if needed.  Patient/caregiver understands and will follow disposition?: Unsure               Copied from CRM [id number]. Topic: Clinical - Red Word Triage >> May 07, 2024 11:56 AM Charlet HERO wrote: Red Word that prompted transfer to Nurse Triage: Patient is having trouble breathing she is out of all her inhalers did refill request. Reason for Disposition  [1] MODERATE difficulty breathing (e.g., speaks in phrases, SOB even at rest, pulse 100-120) AND [2] NEW-onset or WORSE than normal  Answer Assessment - Initial Assessment Questions 1. RESPIRATORY STATUS: Describe your breathing? (e.g., wheezing, shortness of breath, unable to speak, severe coughing)      wheezing 2. ONSET: When did this breathing problem begin?      ongoing 3. PATTERN Does the difficult breathing come and go, or has it been constant since it started?      constant 4. SEVERITY: How bad is your breathing? (e.g., mild, moderate, severe)      Mild - moderate 5. RECURRENT SYMPTOM: Have you had difficulty breathing before? If Yes, ask: When was the last time? and What happened that time?      ongoing 6. CARDIAC HISTORY: Do you have any history of heart disease? (e.g., heart attack, angina, bypass surgery, angioplasty)      no 7. LUNG HISTORY: Do you have any history of lung disease?  (e.g., pulmonary embolus, asthma, emphysema)     copd 8. CAUSE: What  do you think is causing the breathing problem?      Copd - mucous 9. OTHER SYMPTOMS: Do you have any other symptoms? (e.g., chest pain, cough, dizziness, fever, runny nose)     sob  Protocols used: Breathing Difficulty-A-AH

## 2024-05-07 NOTE — Telephone Encounter (Signed)
 Called and notified patient that her inhalers have been sent in for her. Patient states she went outside to get some fresh air and is breathing a little better. Advised patient to be seen at [facility] or the ER if breathing worsens. Patient verbalized understanding.

## 2024-05-08 NOTE — Telephone Encounter (Signed)
 Albuterol  refilled 05/07/24 with 4 refills. Anoro refilled 05/07/24 with 4 refills. Requested Prescriptions  Refused Prescriptions Disp Refills   albuterol  (VENTOLIN  HFA) 108 (90 Base) MCG/ACT inhaler 18 g 4    Sig: Inhale 2 puffs into the lungs every 6 (six) hours as needed for wheezing or shortness of breath.     Pulmonology:  Beta Agonists 2 Failed - 05/08/2024  2:56 PM      Failed - Last Heart Rate in normal range    Pulse Readings from Last 1 Encounters:  05/28/23 (!) 111         Failed - Valid encounter within last 12 months    Recent Outpatient Visits   None            Passed - Last BP in normal range    BP Readings from Last 1 Encounters:  05/28/23 138/89          fluticasone  (FLONASE ) 50 MCG/ACT nasal spray 48 g 2    Sig: SHAKE LIQUID AND USE 2 SPRAYS IN EACH NOSTRIL DAILY     Ear, Nose, and Throat: Nasal Preparations - Corticosteroids Failed - 05/08/2024  2:56 PM      Failed - Valid encounter within last 12 months    Recent Outpatient Visits   None             umeclidinium-vilanterol (ANORO ELLIPTA ) 62.5-25 MCG/ACT AEPB 60 each 4    Sig: Inhale 1 puff into the lungs daily at 6 (six) AM.     Pulmonology:  Combination Products Failed - 05/08/2024  2:56 PM      Failed - Valid encounter within last 12 months    Recent Outpatient Visits   None

## 2024-06-14 ENCOUNTER — Other Ambulatory Visit: Payer: Self-pay | Admitting: Nurse Practitioner

## 2024-06-16 NOTE — Telephone Encounter (Signed)
 OV 05/15/23, Attempted to contact patient- no answer/mailbox full- unable to leave call back message Requested Prescriptions  Pending Prescriptions Disp Refills   fluticasone  (FLONASE ) 50 MCG/ACT nasal spray [Pharmacy Med Name: FLUTICASONE  NASAL SP (120) RX] 48 g 2    Sig: USE 2 SPRAYS INTO EACH NOSTRIL EVERY DAY     Ear, Nose, and Throat: Nasal Preparations - Corticosteroids Failed - 06/16/2024 12:52 PM      Failed - Valid encounter within last 12 months    Recent Outpatient Visits   None

## 2024-07-02 ENCOUNTER — Other Ambulatory Visit: Payer: Self-pay | Admitting: Nurse Practitioner

## 2024-07-03 NOTE — Telephone Encounter (Signed)
 Requested medications are due for refill today.  yes  Requested medications are on the active medications list.  yes  Last refill. 03/16/2024 48 g 2 rf  Future visit scheduled.   no  Notes to clinic.  Pt last seen 05/15/2023 - pt missed 2 appt in 11/2023    Requested Prescriptions  Pending Prescriptions Disp Refills   fluticasone  (FLONASE ) 50 MCG/ACT nasal spray [Pharmacy Med Name: Fluticasone  Propionate 50 MCG/ACT Nasal Suspension] 16 g 0    Sig: Use 2 spray(s) in each nostril once daily     Ear, Nose, and Throat: Nasal Preparations - Corticosteroids Failed - 07/03/2024 12:00 PM      Failed - Valid encounter within last 12 months    Recent Outpatient Visits   None

## 2024-10-11 ENCOUNTER — Other Ambulatory Visit: Payer: Self-pay | Admitting: Nurse Practitioner

## 2024-10-13 ENCOUNTER — Telehealth: Payer: Self-pay

## 2024-10-13 ENCOUNTER — Other Ambulatory Visit: Payer: Self-pay | Admitting: Nurse Practitioner

## 2024-10-13 NOTE — Telephone Encounter (Signed)
 Copied from CRM #[id number]. Topic: Clinical - Prescription Issue >> Oct 13, 2024  9:49 AM Jeoffrey H wrote: Reason for CRM: Patient stated she was unable to pick up her ANORO ELLIPTA  62.5-25 MCG/ACT AEPB and she was advised to call provider office. I saw that provider signed it but then reordered medication. It was sent to the correct pharmacy. Patient unable to pick up med.    Marycruz- [phone]

## 2024-10-13 NOTE — Telephone Encounter (Signed)
Called patient.  Unable to leave message due to voicemail full.

## 2024-10-13 NOTE — Telephone Encounter (Unsigned)
 Copied from CRM [id number]. Topic: Clinical - Medication Refill >> Oct 13, 2024  9:43 AM Amber H wrote: Medication: fluticasone  (FLONASE ) 50 MCG/ACT nasal spray  Has the patient contacted their pharmacy? Yes, she told to contact her provider.  (Agent: If no, request that the patient contact the pharmacy for the refill. If patient does not wish to contact the pharmacy document the reason why and proceed with request.) (Agent: If yes, when and what did the pharmacy advise?)  This is the patient's preferred pharmacy:   [id number] - [place], [place] - [address] [address] AT [facility] [address] [place] [address] [place] [address] Phone: [phone] Fax: [phone]  Is this the correct pharmacy for this prescription? Yes If no, delete pharmacy and type the correct one.   Has the prescription been filled recently? No  Is the patient out of the medication? Yes  Has the patient been seen for an appointment in the last year OR does the patient have an upcoming appointment? No, last appt was 05/15/2023  Can we respond through MyChart? No  Agent: Please be advised that Rx refills may take up to 3 business days. We ask that you follow-up with your pharmacy.

## 2024-10-14 NOTE — Telephone Encounter (Signed)
 Too soon for refill. OFFICE VISIT NEEDED FOR ADDITIONAL REFILLS   Requested Prescriptions  Pending Prescriptions Disp Refills   fluticasone  (FLONASE ) 50 MCG/ACT nasal spray 16 g 3    Sig: Use 2 spray(s) in each nostril once daily     Ear, Nose, and Throat: Nasal Preparations - Corticosteroids Failed - 10/14/2024  3:27 PM      Failed - Valid encounter within last 12 months    Recent Outpatient Visits   None

## 2024-10-19 ENCOUNTER — Other Ambulatory Visit: Payer: Self-pay | Admitting: Nurse Practitioner
